# Patient Record
Sex: Female | Born: 1940 | Race: White | Hispanic: No | Marital: Married
Health system: Southern US, Community
[De-identification: ages and names within clinical notes are randomized; demographics above are authoritative.]

## PROBLEM LIST (undated history)

## (undated) DIAGNOSIS — E782 Mixed hyperlipidemia: Secondary | ICD-10-CM

## (undated) DIAGNOSIS — I10 Essential (primary) hypertension: Secondary | ICD-10-CM

## (undated) DIAGNOSIS — I214 Non-ST elevation (NSTEMI) myocardial infarction: Secondary | ICD-10-CM

## (undated) DIAGNOSIS — M1990 Unspecified osteoarthritis, unspecified site: Secondary | ICD-10-CM

## (undated) DIAGNOSIS — F419 Anxiety disorder, unspecified: Secondary | ICD-10-CM

## (undated) DIAGNOSIS — D849 Immunodeficiency, unspecified: Secondary | ICD-10-CM

## (undated) DIAGNOSIS — E7800 Pure hypercholesterolemia, unspecified: Secondary | ICD-10-CM

## (undated) DIAGNOSIS — I4892 Unspecified atrial flutter: Secondary | ICD-10-CM

## (undated) HISTORY — DX: Non-ST elevation (NSTEMI) myocardial infarction: I21.4

## (undated) HISTORY — DX: Mixed hyperlipidemia: E78.2

## (undated) HISTORY — DX: Essential (primary) hypertension: I10

## (undated) HISTORY — DX: Unspecified atrial flutter: I48.92

---

## 2005-11-29 ENCOUNTER — Ambulatory Visit (HOSPITAL_COMMUNITY): Admission: RE | Admit: 2005-11-29 | Discharge: 2005-11-29 | Payer: Self-pay | Admitting: Internal Medicine

## 2005-11-29 IMAGING — CT CT HEAD W/O CM
1 series · 16 of 30 positions shown, 20 images · non-contrast
Comparison: none

HISTORY: Right facial and arm weakness

[Series 3997: — · axial · 0.46mm/px · z∈[-687,-552]mm · 16 of 30 slices shown, 20 images]
[im 2/30  brain]
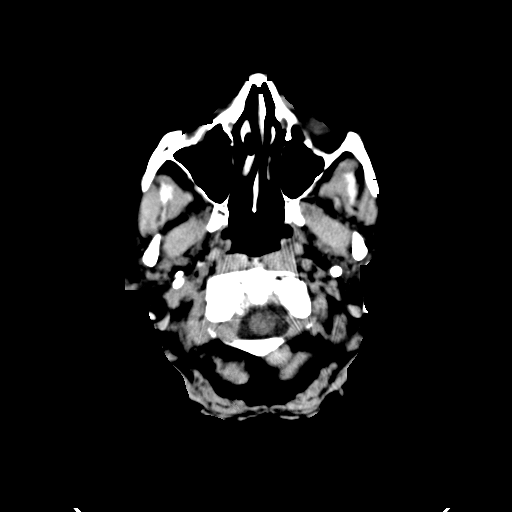
[im 2/30  bone]
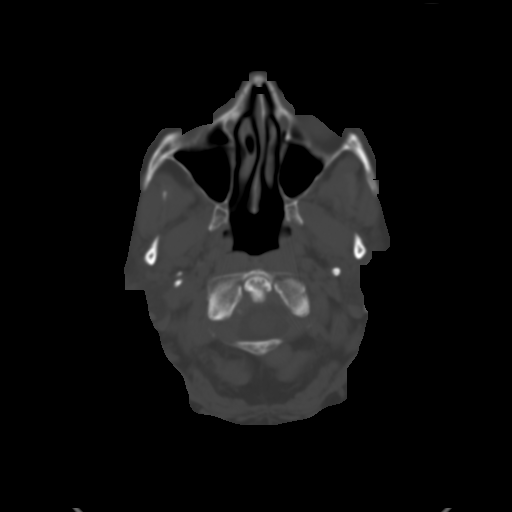
[im 4/30  brain]
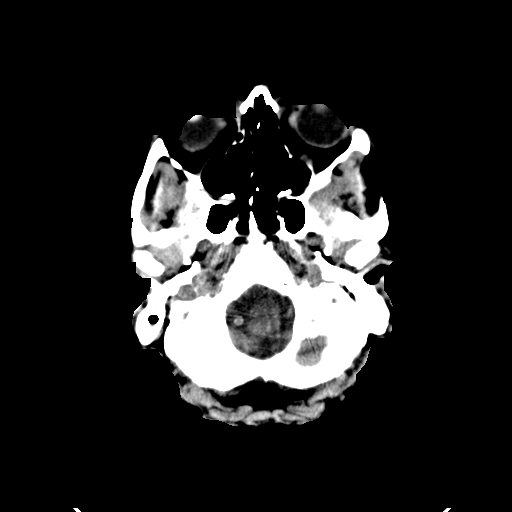
[im 6/30  brain]
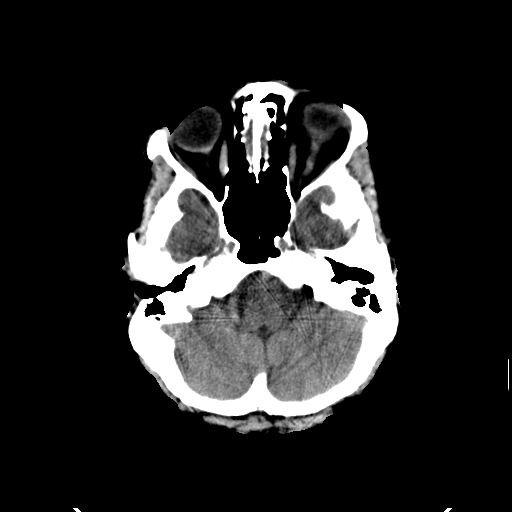
[im 8/30  brain]
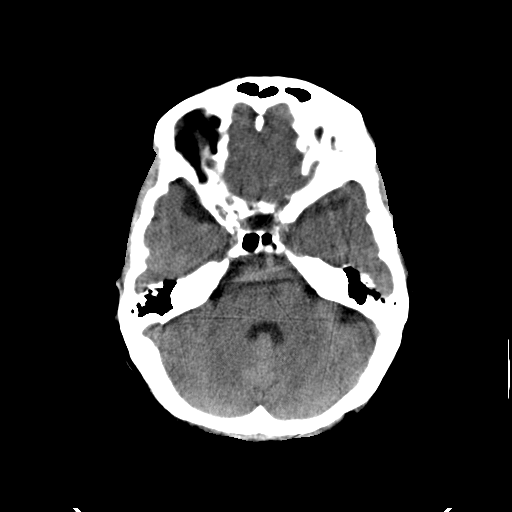
[im 9/30  brain]
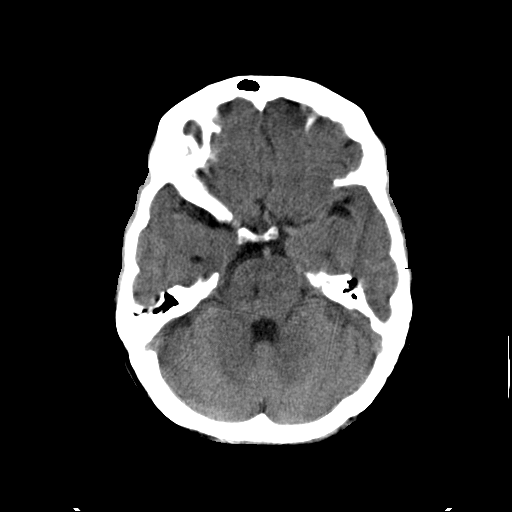
[im 9/30  bone]
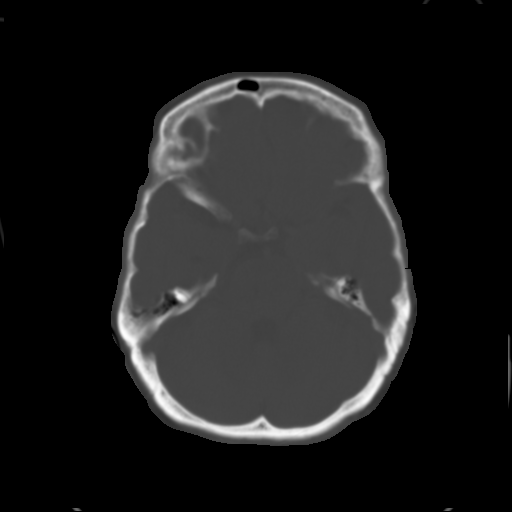
[im 11/30  brain]
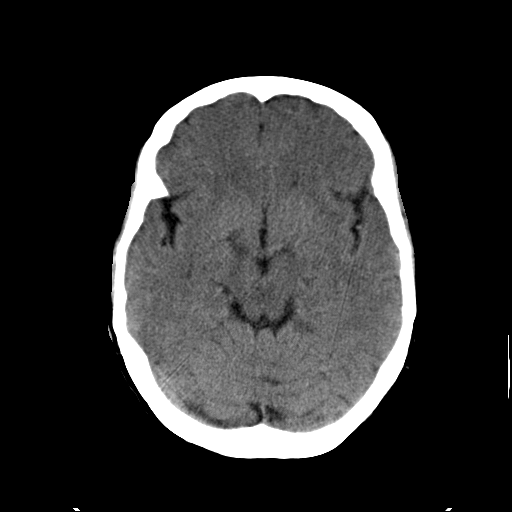
[im 13/30  brain]
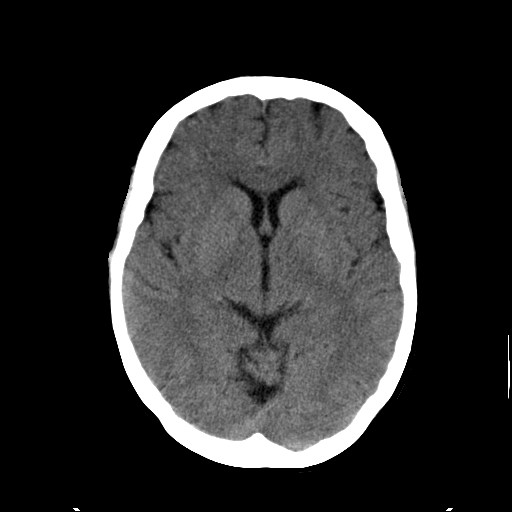
[im 15/30  brain]
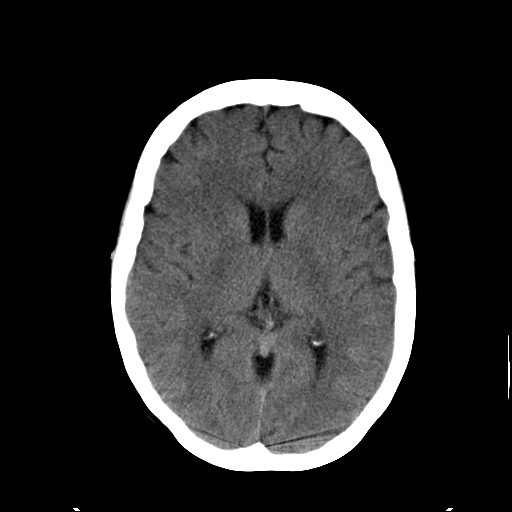
[im 16/30  brain]
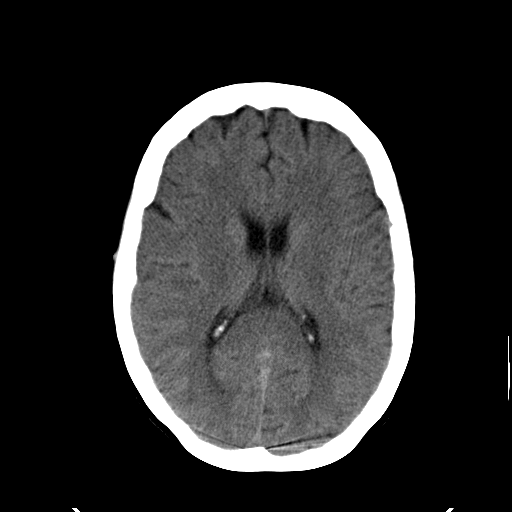
[im 16/30  bone]
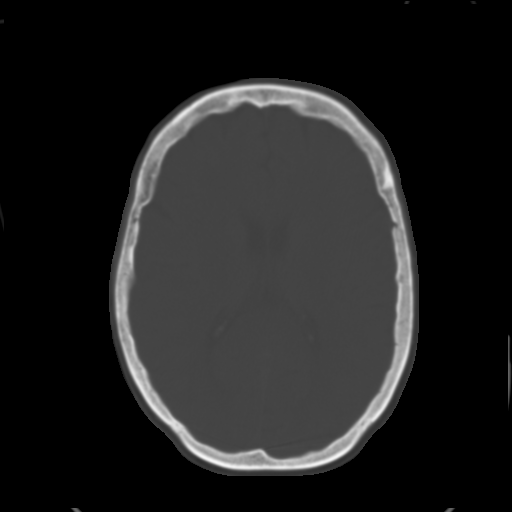
[im 18/30  brain]
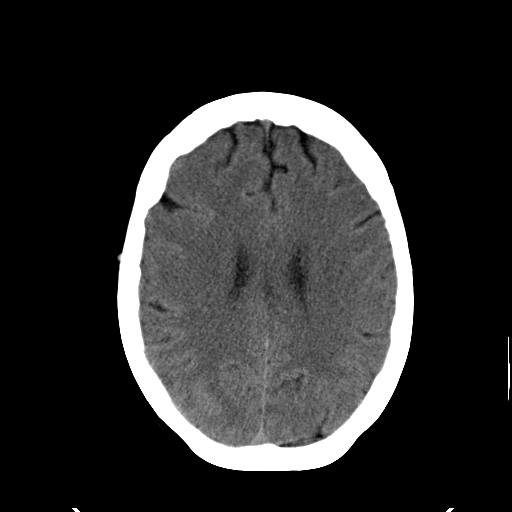
[im 20/30  brain]
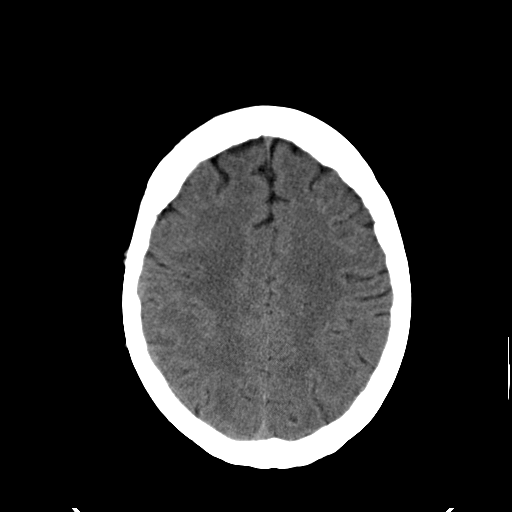
[im 22/30  brain]
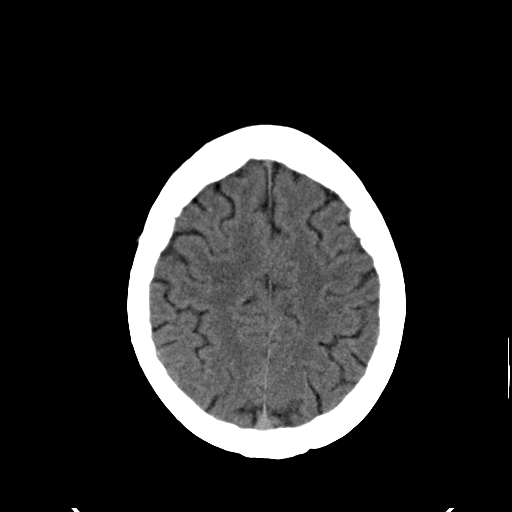
[im 23/30  brain]
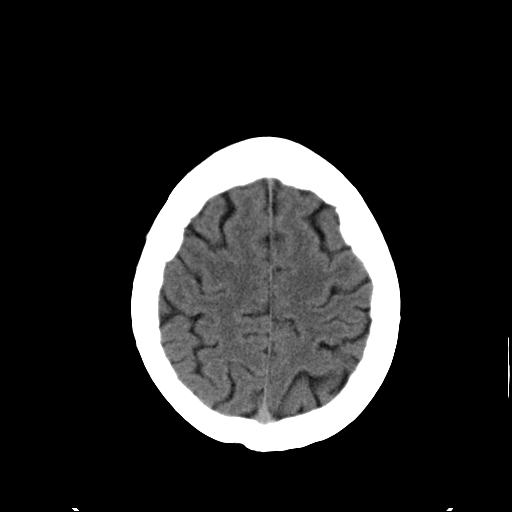
[im 23/30  bone]
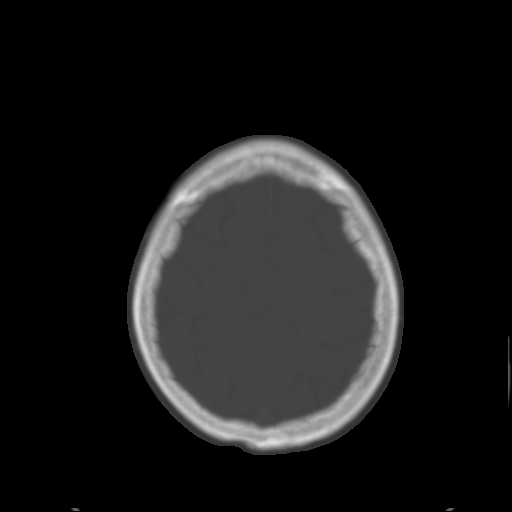
[im 25/30  brain]
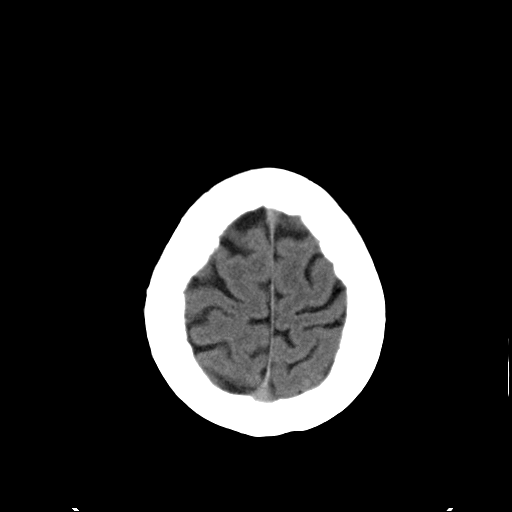
[im 27/30  brain]
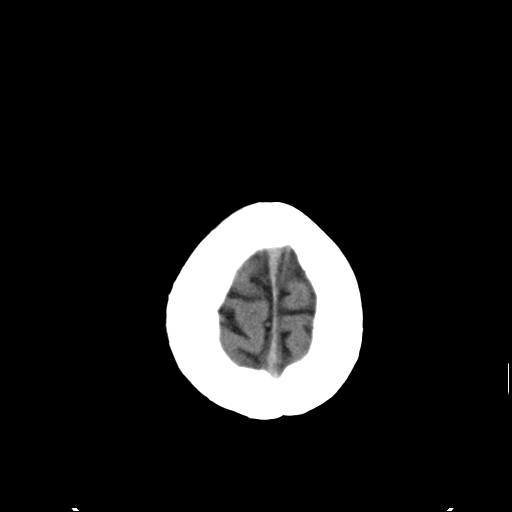
[im 29/30  brain]
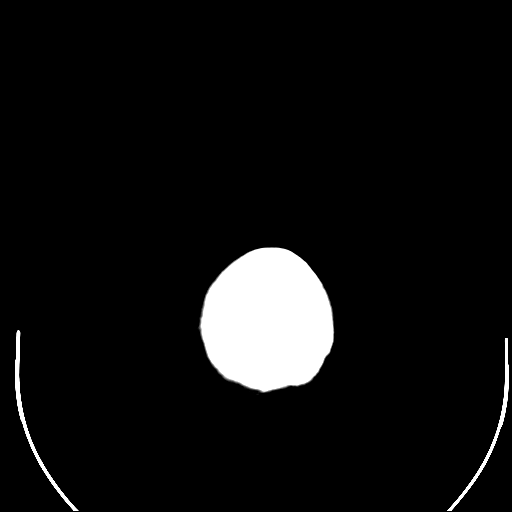

[16 of 30 positions shown; findings below may reference images not displayed]

CT HEAD WITHOUT CONTRAST:

Routine noncontrast CT without priors for comparison.

Normal ventricular morphology.
No midline shift or mass-effect.
Scattered beam hardening artifacts at skullbase and brain stem.
Focal decreased attenuation centrally in right pons, likely subacute to old
pontine infarct.
No acute infarct, intracranial hemorrhage, or mass.
Paranasal sinuses clear.
Bones unremarkable.
IMPRESSION: Right central pontine infarct, probably subacute to old.
No other intracranial abnormalities.

## 2005-11-29 IMAGING — US US CAROTID DUPLEX BILAT
1 series · 14 of 24 positions shown · non-contrast
Comparison: No previous for comparison.

CLINICAL DATA: Right facial and arm weakness.  
 BILATERAL CAROTID DUPLEX DOPPLER ULTRASOUND:

[Series 1: unknown · 0.07mm/px · 14 of 59 slices shown]
[im 1/59]
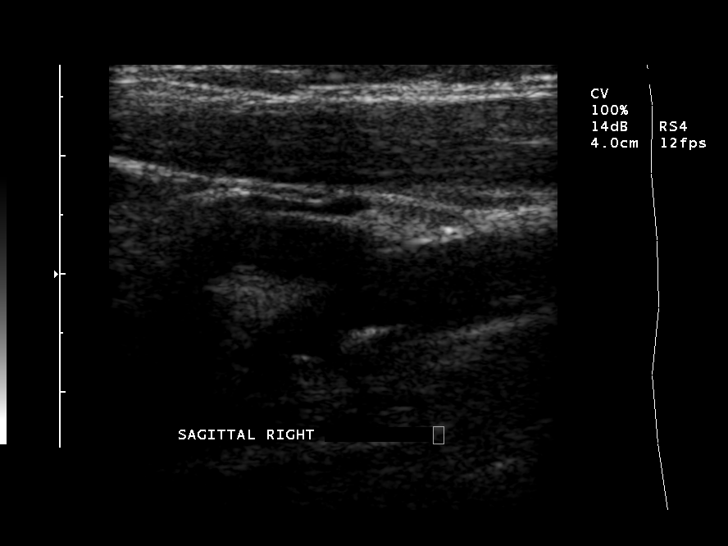
[im 6/59]
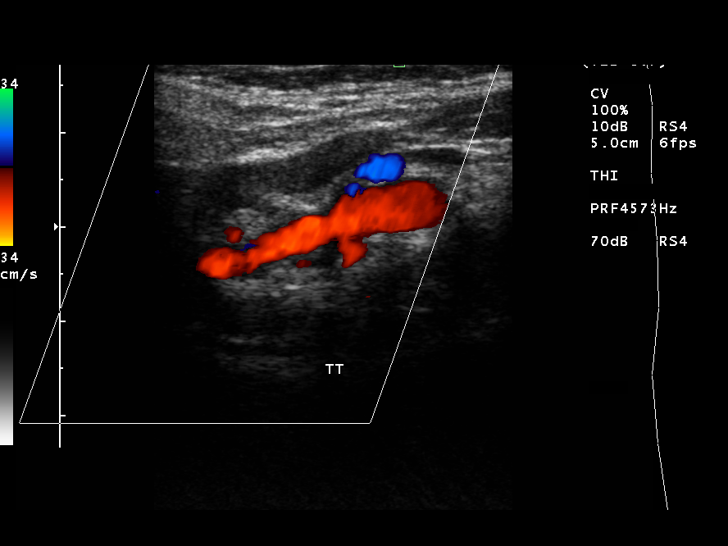
[im 11/59]
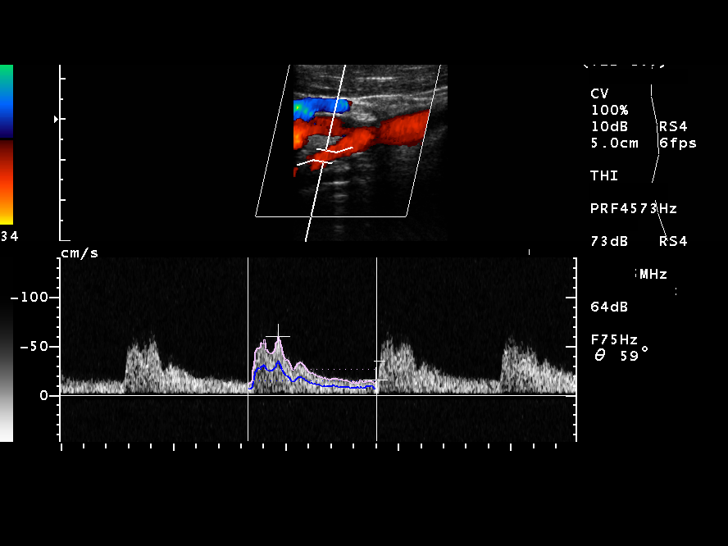
[im 16/59]
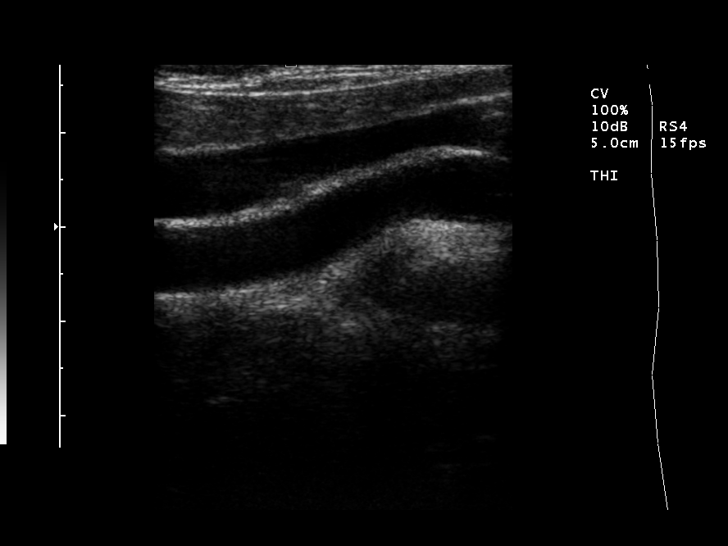
[im 18/59]
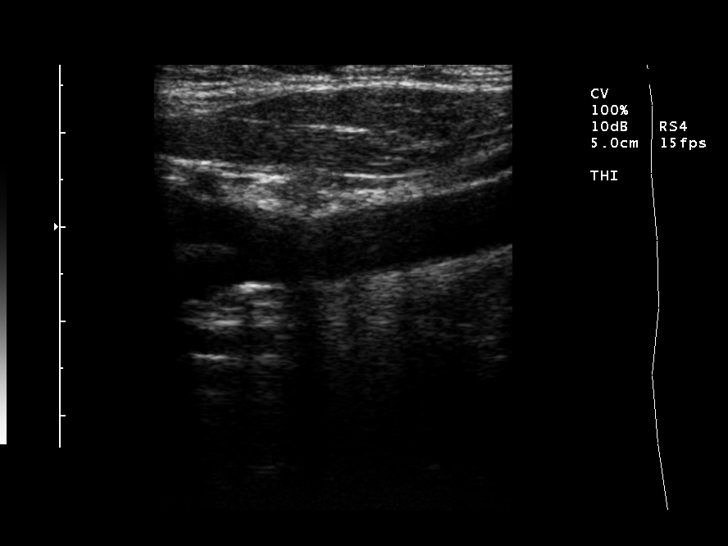
[im 23/59]
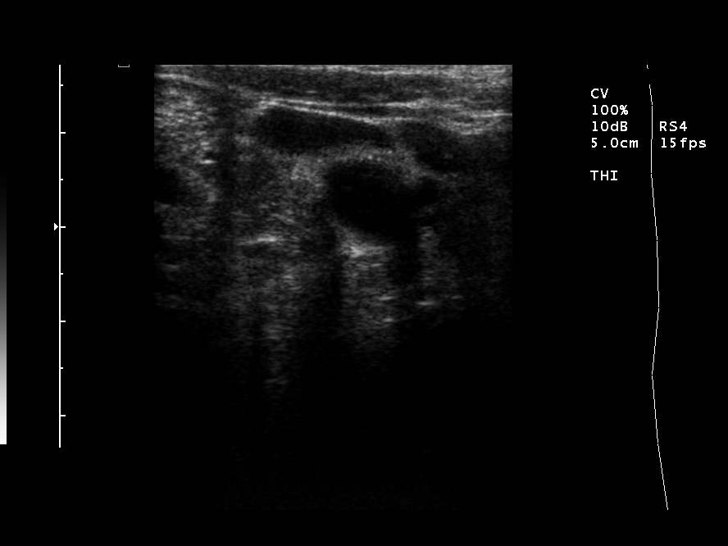
[im 28/59]
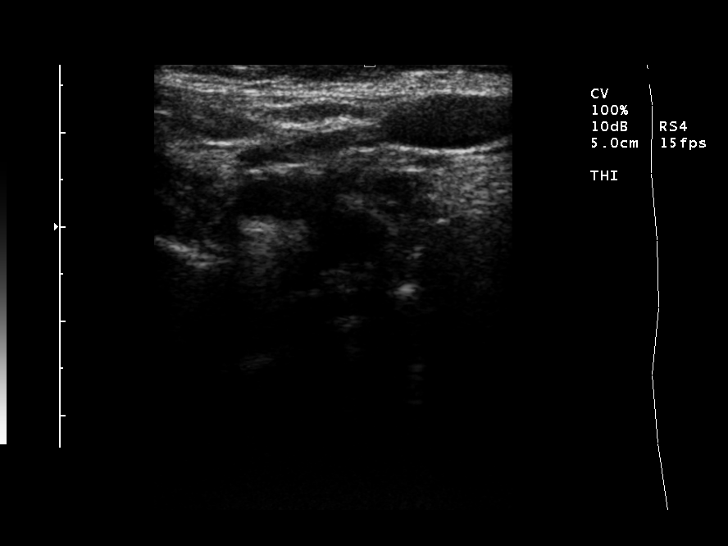
[im 31/59]
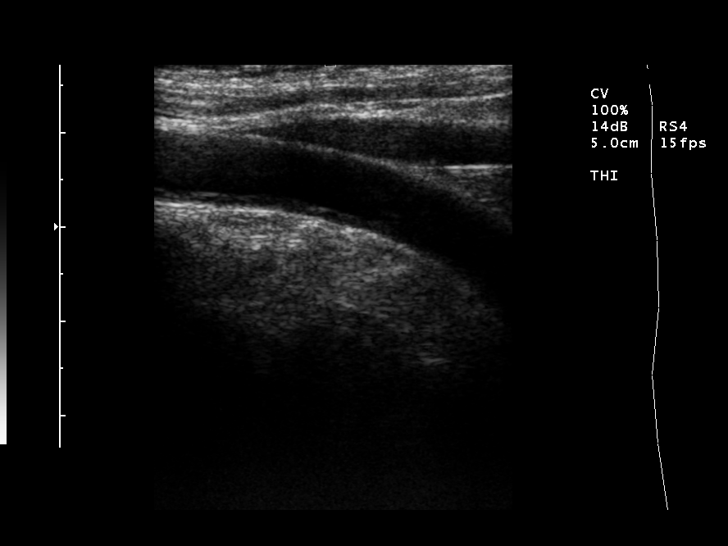
[im 36/59]
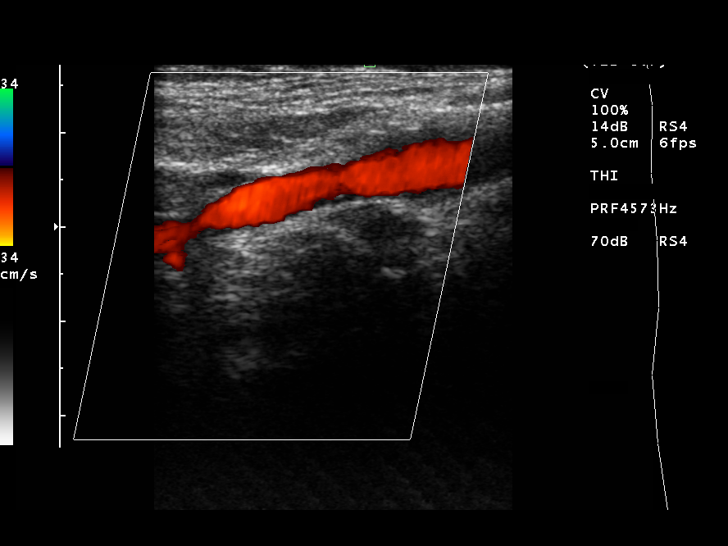
[im 41/59]
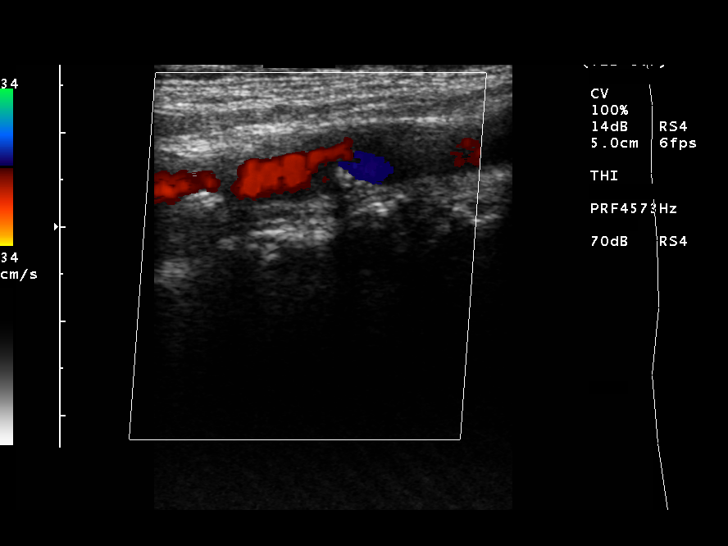
[im 46/59]
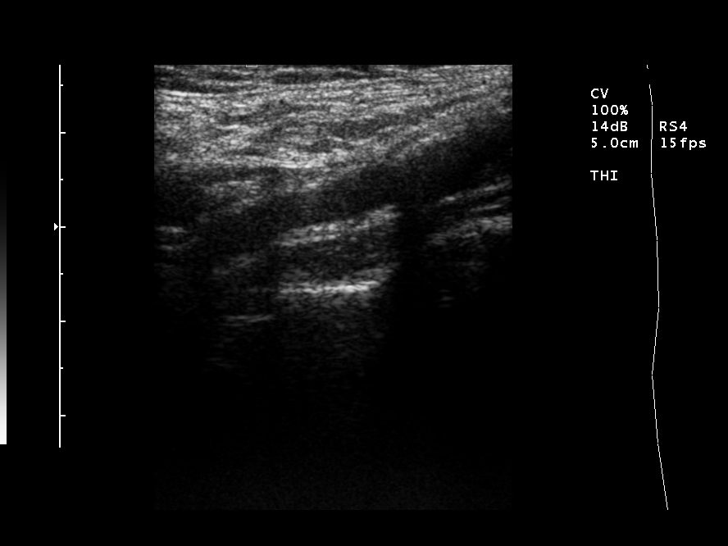
[im 48/59]
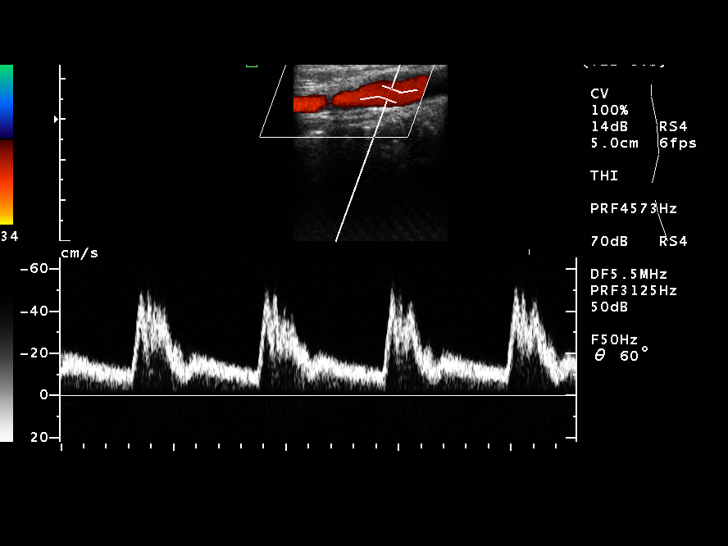
[im 53/59]
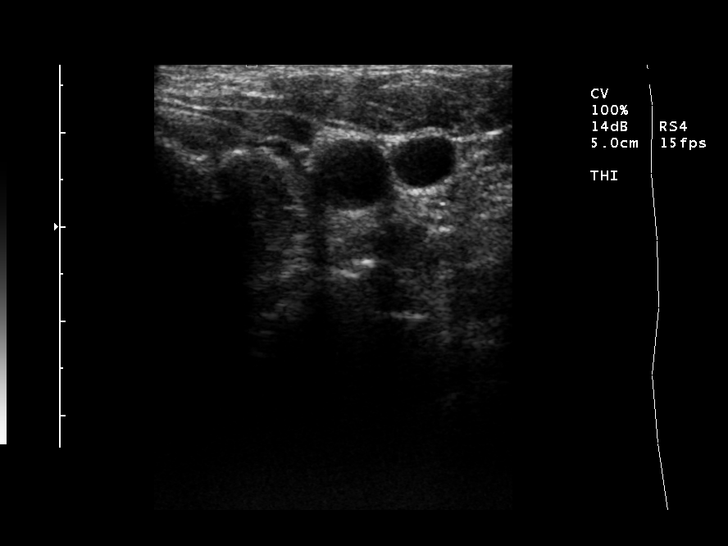
[im 59/59]
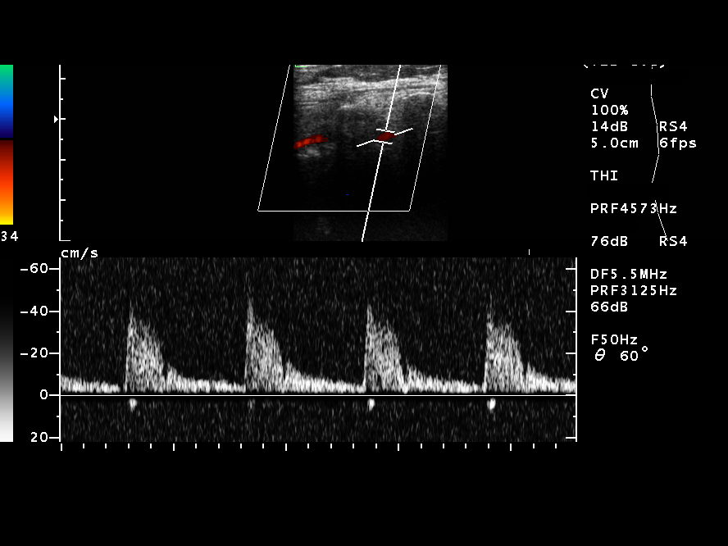

[14 of 24 positions shown; findings below may reference images not displayed]

FINDINGS: The exam is technically adequate.  Minimal plaque in both internal carotid arteries.  Appropriate waveforms throughout the extracranial carotid circulation.  No focal aliasing on color Doppler interrogation.  Antegrade flow in both vertebral arteries.  
 The following Doppler flow velocity measurements were obtained (in cm/sec):
 SITE:  PEAK SYSTOLIC  END DIASTOLIC
 RIGHT  ICA:        60  16  
 RIGHT ECA:    71  3
 RIGHT CCA:    88  13
 RIGHT ICA/CCA RATIO:      .68
 LEFT ICA:  85  25
 LEFT ECA:    59    4
 LEFT CCA:    49  13
 LEFT ICA/CCA RATIO:
 Criteria:  Quantification of carotid stenosis is based on velocity parameters that correlate the residual internal carotid diameter with NASCET-based stenosis levels.
IMPRESSION: Mild proximal internal carotid artery plaque bilaterally without hemodynamically significant stenosis.  Continued surveillance recommended.

## 2006-09-28 ENCOUNTER — Ambulatory Visit: Payer: Self-pay | Admitting: Internal Medicine

## 2006-09-28 ENCOUNTER — Ambulatory Visit (HOSPITAL_COMMUNITY): Admission: RE | Admit: 2006-09-28 | Discharge: 2006-09-28 | Payer: Self-pay | Admitting: Internal Medicine

## 2006-09-28 ENCOUNTER — Encounter: Payer: Self-pay | Admitting: Internal Medicine

## 2010-05-24 NOTE — Op Note (Signed)
Theresa Norman, Theresa Norman               ACCOUNT NO.:  [id number]   MEDICAL RECORD NO.:  [id number]          PATIENT TYPE:  AMB   LOCATION:  DAY                           FACILITY:  [facility]   PHYSICIAN:  R. Roetta Sessions, M.D. DATE OF BIRTH:  [birth year]   DATE OF PROCEDURE:  09/28/2006  DATE OF DISCHARGE:                               OPERATIVE REPORT   PROCEDURE:  Colonoscopy with biopsy.   INDICATIONS FOR PROCEDURE:  A 70 year old, Caucasian female with no  lower GI tract symptoms sent over at the courtesy Dr. Catalina Pizza for  colorectal cancer screening.  Her mother succumbed to colon cancer at  age 50. [name] again has never had her lower GI tract evaluated.  Colonoscopy is now being done.  This approach has been discussed with  the patient at length. The potential risks, benefits and alternatives  have been reviewed, questions answered.  She is agreeable.  Please see  the documentation in the medical record.   MONITORING:  O2 saturation, blood pressure, pulse and respirations were  monitored throughout the entire procedure.   CONSCIOUS SEDATION:  IV Versed, Demerol in incremental doses.   INSTRUMENT:  Pentax video chip system.   FINDINGS:  Digital rectal exam revealed no abnormalities.   ENDOSCOPIC FINDINGS:  The prep was good.   COLON:  The colonic mucosa was surveyed from the rectosigmoid junction  through the left transverse and right colon to the area of the  appendiceal orifice, ileocecal valve and cecum. These structures were  well seen and photographed for the record.   From this level, the scope was slowly withdrawn and all previously  mentioned mucosal surfaces were again seen.  The patient had multiple 2-  3 mm polyps about the cecum which were cold biopsied/removed.  There was  a single diminutive polyp in the mid descending colon which was cold  biopsied/removed. The remainder of the colonic mucosa appeared normal.  The scope was pulled down in the rectum where a  thorough examination of  the rectal mucosa including retroflexed view of anal verge demonstrated  only some anal papilla.  The patient tolerated the procedure well and  was reacted in endoscopy.   IMPRESSION:  1. Anal papilla otherwise normal rectum.  2. Single diminutive polyp mid descending colon cold biopsied/removed.      Multiple diminutive cecal polyps cold biopsied/removed. The      remainder of the colonic mucosa appeared normal.   RECOMMENDATIONS:  1. Follow-up on path.  2. Further recommendations to follow.      Jonathon Bellows, M.D.  Electronically Signed     RMR/MEDQ  D:  09/28/2006  T:  09/28/2006  Job:  [id number]   cc:   Catalina Pizza, M.D.  Fax: [phone]

## 2011-09-18 ENCOUNTER — Encounter: Payer: Self-pay | Admitting: Internal Medicine

## 2013-05-06 ENCOUNTER — Other Ambulatory Visit (HOSPITAL_COMMUNITY): Payer: Self-pay | Admitting: Internal Medicine

## 2013-05-06 ENCOUNTER — Ambulatory Visit (HOSPITAL_COMMUNITY)
Admission: RE | Admit: 2013-05-06 | Discharge: 2013-05-06 | Disposition: A | Discharge status: Home / Self Care | Payer: Medicare Other | Source: Ambulatory Visit | Attending: Internal Medicine | Admitting: Internal Medicine

## 2013-05-06 DIAGNOSIS — M79609 Pain in unspecified limb: Secondary | ICD-10-CM | POA: Insufficient documentation

## 2013-05-06 DIAGNOSIS — M25469 Effusion, unspecified knee: Secondary | ICD-10-CM | POA: Insufficient documentation

## 2013-05-06 DIAGNOSIS — M25562 Pain in left knee: Secondary | ICD-10-CM

## 2013-05-06 DIAGNOSIS — M7989 Other specified soft tissue disorders: Secondary | ICD-10-CM | POA: Insufficient documentation

## 2013-05-06 IMAGING — CR DG KNEE COMPLETE 4+V*L*
4 series · 4 of 4 positions shown · non-contrast
Comparison: None.

CLINICAL DATA: Left knee pain swelling beginning [DATE]. No
known injury.

EXAM:
LEFT KNEE - COMPLETE 4+ VIEW

[view not recorded (1 of 4)]
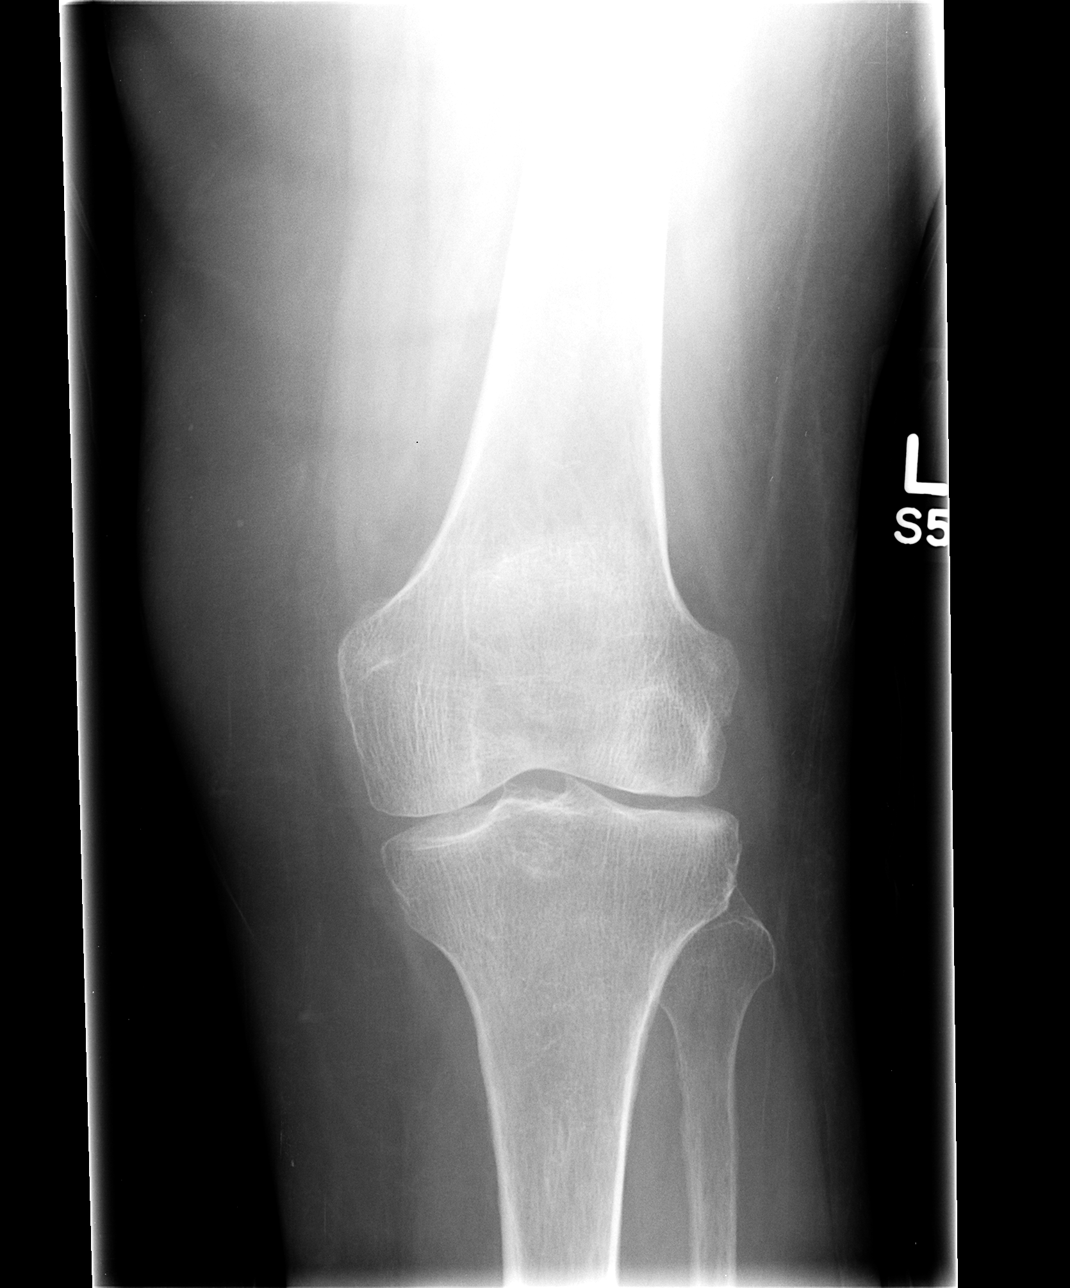

[view not recorded (2 of 4)]
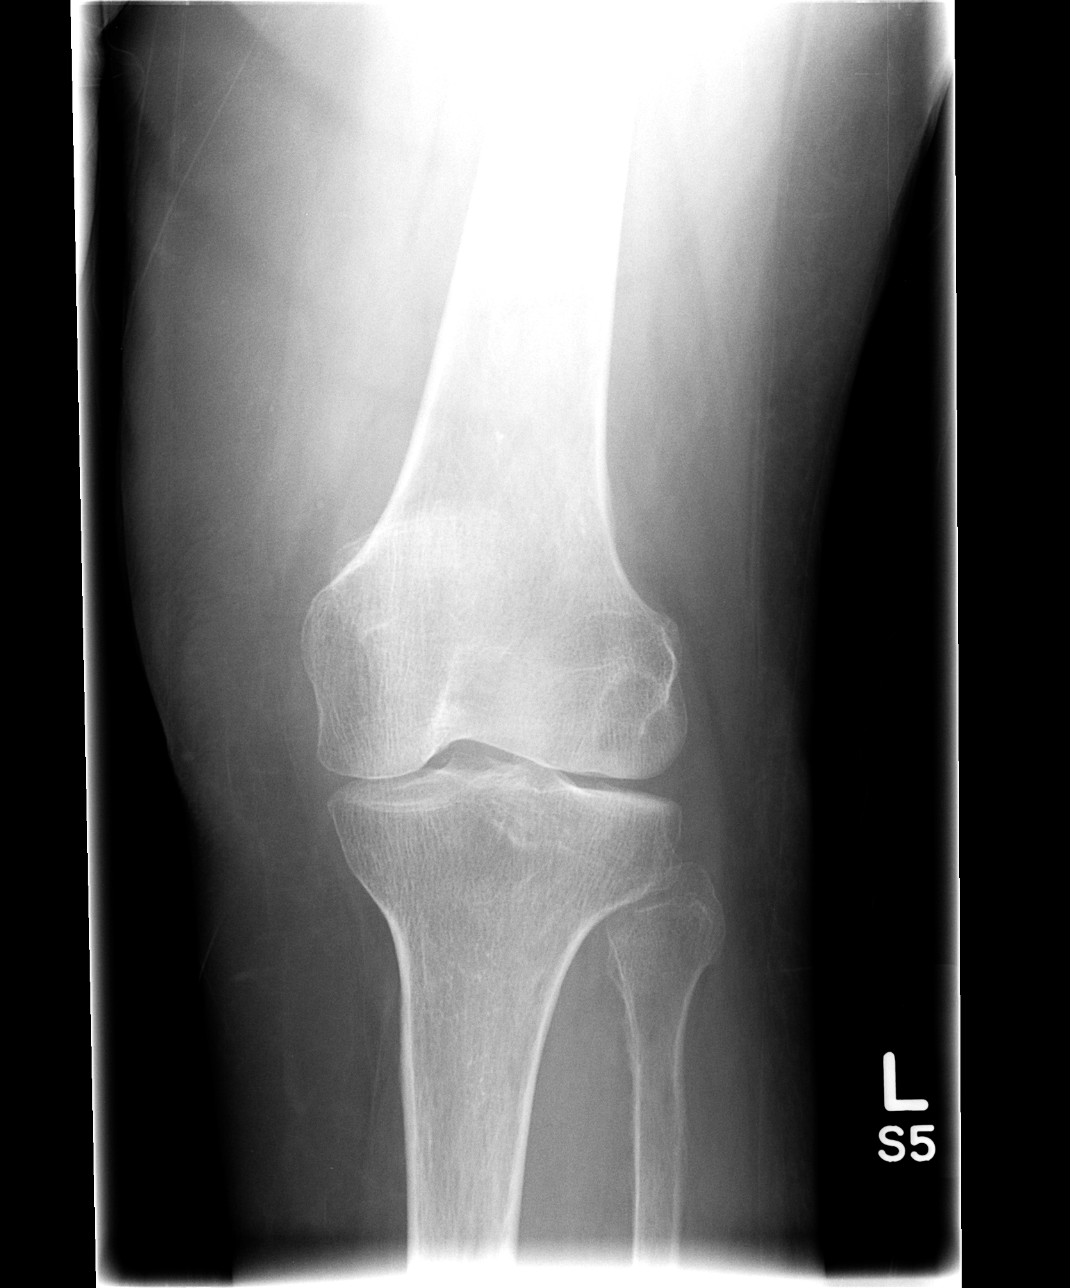

[view not recorded (3 of 4)]
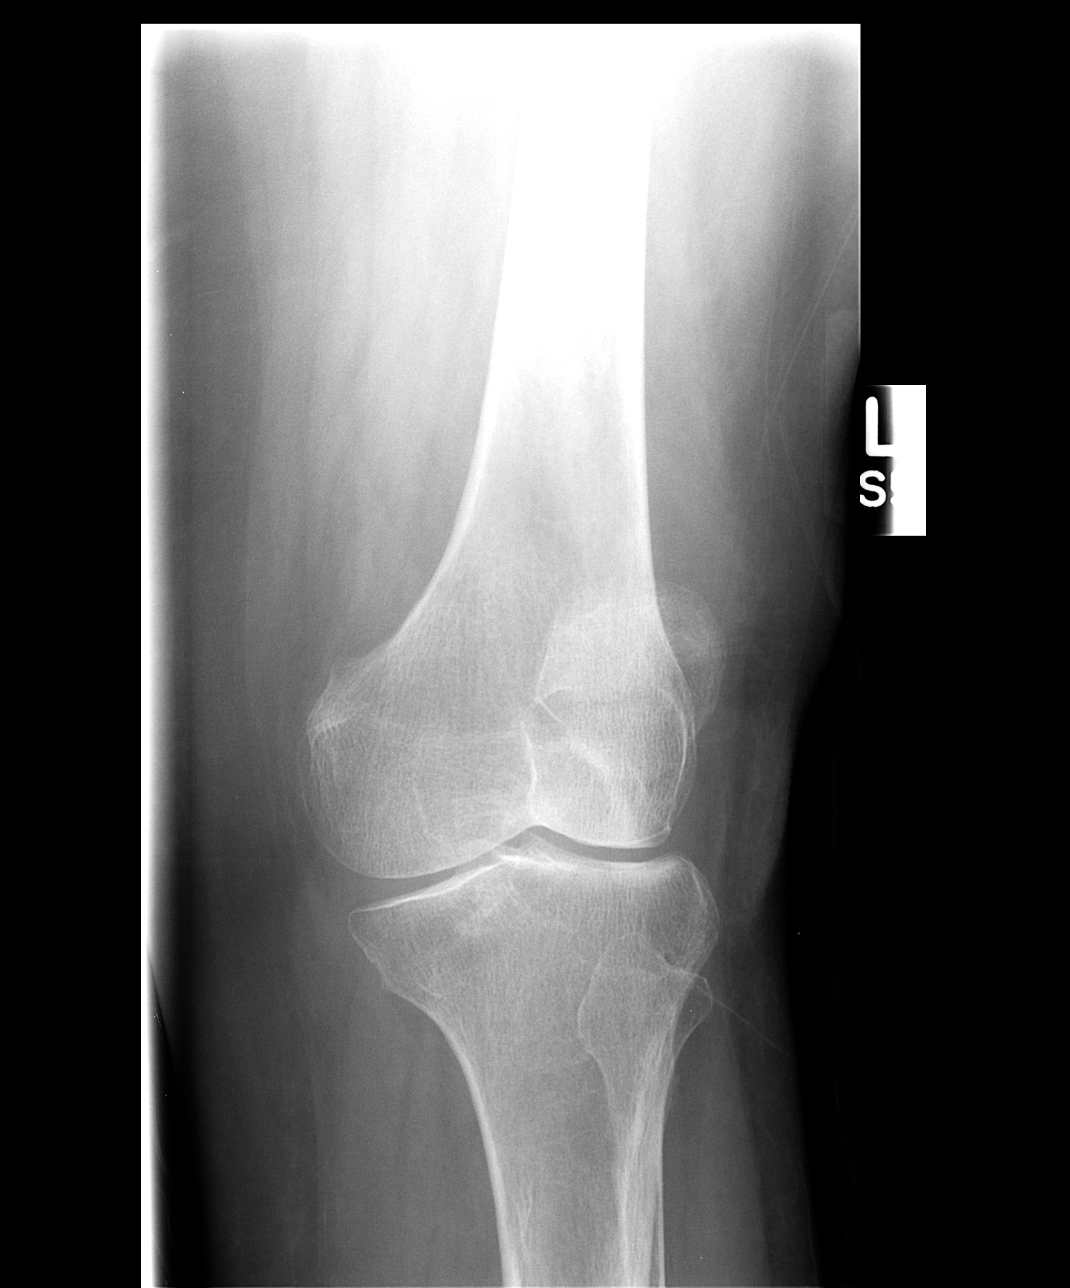

[view not recorded (4 of 4)]
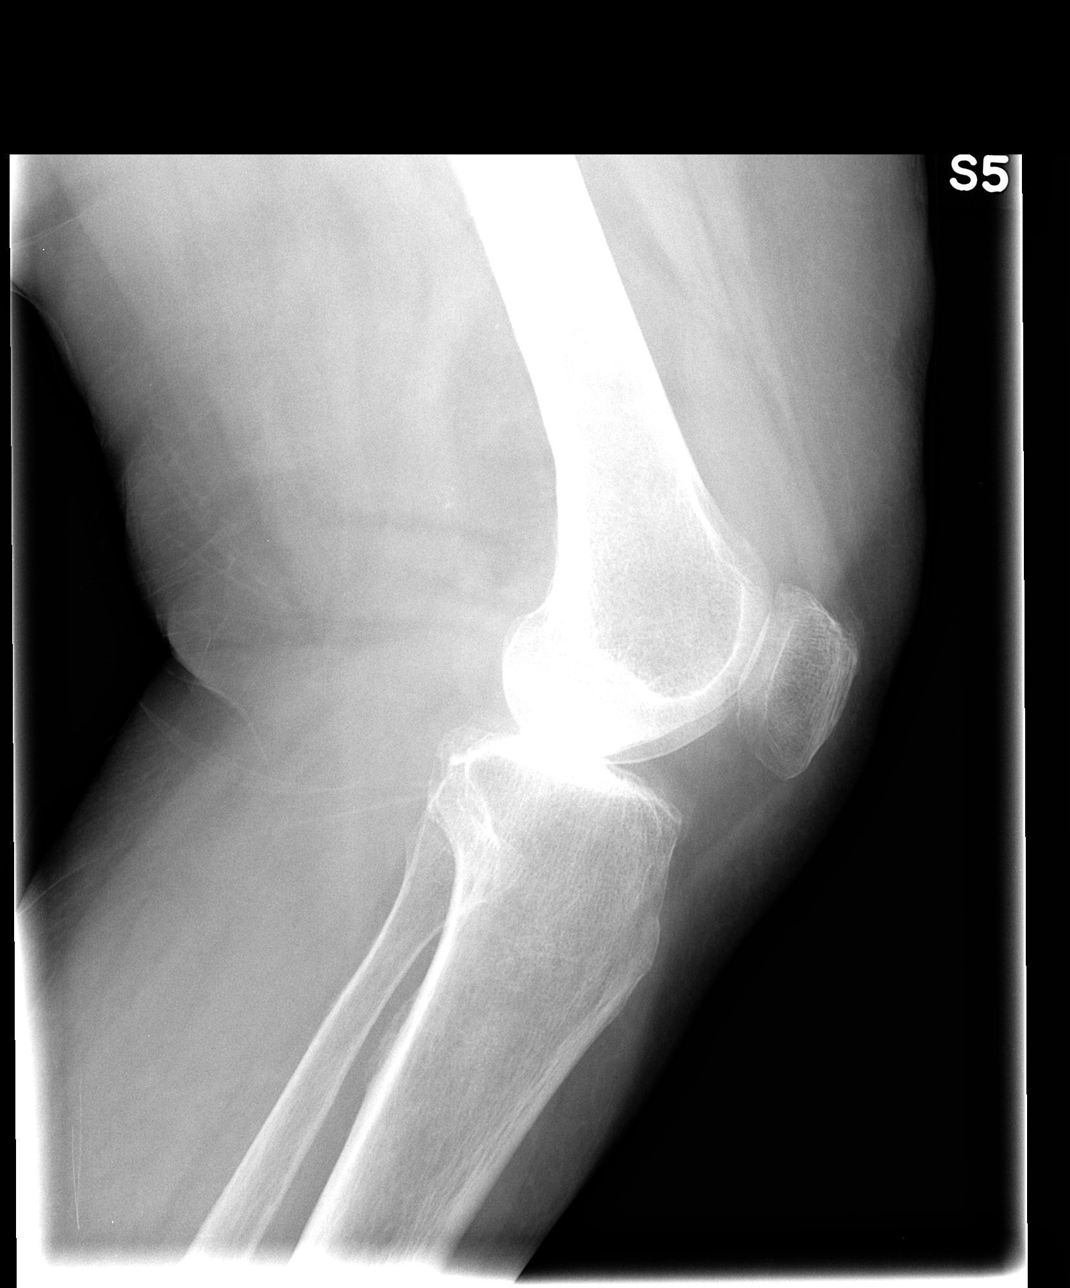

[4 of 4 positions shown; findings below may reference images not displayed]

FINDINGS: There is a small joint effusion. No fracture is identified. No
notable degenerative change is seen about the knee. No erosion or
other focal bony lesion is identified.
IMPRESSION: Small joint effusion.  Otherwise negative.

## 2013-11-17 ENCOUNTER — Other Ambulatory Visit (HOSPITAL_COMMUNITY): Payer: Self-pay | Admitting: Internal Medicine

## 2013-11-17 DIAGNOSIS — Z1231 Encounter for screening mammogram for malignant neoplasm of breast: Secondary | ICD-10-CM

## 2013-12-01 ENCOUNTER — Ambulatory Visit (HOSPITAL_COMMUNITY)
Admission: RE | Admit: 2013-12-01 | Discharge: 2013-12-01 | Disposition: A | Discharge status: Home / Self Care | Payer: Medicare Other | Source: Ambulatory Visit | Attending: Internal Medicine | Admitting: Internal Medicine

## 2013-12-01 DIAGNOSIS — R928 Other abnormal and inconclusive findings on diagnostic imaging of breast: Secondary | ICD-10-CM | POA: Diagnosis not present

## 2013-12-01 DIAGNOSIS — Z1231 Encounter for screening mammogram for malignant neoplasm of breast: Secondary | ICD-10-CM | POA: Insufficient documentation

## 2013-12-03 ENCOUNTER — Other Ambulatory Visit: Payer: Self-pay | Admitting: Internal Medicine

## 2013-12-03 DIAGNOSIS — R928 Other abnormal and inconclusive findings on diagnostic imaging of breast: Secondary | ICD-10-CM

## 2013-12-16 ENCOUNTER — Ambulatory Visit (HOSPITAL_COMMUNITY)
Admission: RE | Admit: 2013-12-16 | Discharge: 2013-12-16 | Disposition: A | Discharge status: Home / Self Care | Payer: Medicare Other | Source: Ambulatory Visit | Attending: Internal Medicine | Admitting: Internal Medicine

## 2013-12-16 ENCOUNTER — Other Ambulatory Visit: Payer: Self-pay | Admitting: Internal Medicine

## 2013-12-16 DIAGNOSIS — R928 Other abnormal and inconclusive findings on diagnostic imaging of breast: Secondary | ICD-10-CM

## 2014-02-18 DIAGNOSIS — E782 Mixed hyperlipidemia: Secondary | ICD-10-CM | POA: Diagnosis not present

## 2014-02-18 DIAGNOSIS — R7301 Impaired fasting glucose: Secondary | ICD-10-CM | POA: Diagnosis not present

## 2014-02-18 DIAGNOSIS — I10 Essential (primary) hypertension: Secondary | ICD-10-CM | POA: Diagnosis not present

## 2014-02-20 DIAGNOSIS — R7301 Impaired fasting glucose: Secondary | ICD-10-CM | POA: Diagnosis not present

## 2014-02-20 DIAGNOSIS — I10 Essential (primary) hypertension: Secondary | ICD-10-CM | POA: Diagnosis not present

## 2014-02-20 DIAGNOSIS — Z6829 Body mass index (BMI) 29.0-29.9, adult: Secondary | ICD-10-CM | POA: Diagnosis not present

## 2014-02-20 DIAGNOSIS — E782 Mixed hyperlipidemia: Secondary | ICD-10-CM | POA: Diagnosis not present

## 2014-07-17 DIAGNOSIS — R7301 Impaired fasting glucose: Secondary | ICD-10-CM | POA: Diagnosis not present

## 2014-07-17 DIAGNOSIS — E782 Mixed hyperlipidemia: Secondary | ICD-10-CM | POA: Diagnosis not present

## 2014-07-17 DIAGNOSIS — I10 Essential (primary) hypertension: Secondary | ICD-10-CM | POA: Diagnosis not present

## 2014-07-21 DIAGNOSIS — E785 Hyperlipidemia, unspecified: Secondary | ICD-10-CM | POA: Diagnosis not present

## 2014-07-21 DIAGNOSIS — I10 Essential (primary) hypertension: Secondary | ICD-10-CM | POA: Diagnosis not present

## 2014-07-21 DIAGNOSIS — R7301 Impaired fasting glucose: Secondary | ICD-10-CM | POA: Diagnosis not present

## 2014-12-15 DIAGNOSIS — R7301 Impaired fasting glucose: Secondary | ICD-10-CM | POA: Diagnosis not present

## 2014-12-15 DIAGNOSIS — E785 Hyperlipidemia, unspecified: Secondary | ICD-10-CM | POA: Diagnosis not present

## 2014-12-17 DIAGNOSIS — E782 Mixed hyperlipidemia: Secondary | ICD-10-CM | POA: Diagnosis not present

## 2014-12-17 DIAGNOSIS — I10 Essential (primary) hypertension: Secondary | ICD-10-CM | POA: Diagnosis not present

## 2014-12-17 DIAGNOSIS — M25569 Pain in unspecified knee: Secondary | ICD-10-CM | POA: Diagnosis not present

## 2014-12-17 DIAGNOSIS — R7301 Impaired fasting glucose: Secondary | ICD-10-CM | POA: Diagnosis not present

## 2015-03-01 ENCOUNTER — Other Ambulatory Visit (HOSPITAL_COMMUNITY): Payer: Self-pay | Admitting: Internal Medicine

## 2015-03-01 DIAGNOSIS — Z1231 Encounter for screening mammogram for malignant neoplasm of breast: Secondary | ICD-10-CM

## 2015-03-04 ENCOUNTER — Ambulatory Visit (HOSPITAL_COMMUNITY)
Admission: RE | Admit: 2015-03-04 | Discharge: 2015-03-04 | Disposition: A | Discharge status: Home / Self Care | Payer: Medicare Other | Source: Ambulatory Visit | Attending: Internal Medicine | Admitting: Internal Medicine

## 2015-03-04 DIAGNOSIS — Z1231 Encounter for screening mammogram for malignant neoplasm of breast: Secondary | ICD-10-CM | POA: Diagnosis not present

## 2015-06-22 DIAGNOSIS — E782 Mixed hyperlipidemia: Secondary | ICD-10-CM | POA: Diagnosis not present

## 2015-06-22 DIAGNOSIS — R7301 Impaired fasting glucose: Secondary | ICD-10-CM | POA: Diagnosis not present

## 2015-06-24 DIAGNOSIS — I10 Essential (primary) hypertension: Secondary | ICD-10-CM | POA: Diagnosis not present

## 2015-06-24 DIAGNOSIS — M25569 Pain in unspecified knee: Secondary | ICD-10-CM | POA: Diagnosis not present

## 2015-06-24 DIAGNOSIS — R7301 Impaired fasting glucose: Secondary | ICD-10-CM | POA: Diagnosis not present

## 2015-06-24 DIAGNOSIS — E782 Mixed hyperlipidemia: Secondary | ICD-10-CM | POA: Diagnosis not present

## 2015-12-22 DIAGNOSIS — E782 Mixed hyperlipidemia: Secondary | ICD-10-CM | POA: Diagnosis not present

## 2015-12-22 DIAGNOSIS — Z0000 Encounter for general adult medical examination without abnormal findings: Secondary | ICD-10-CM | POA: Diagnosis not present

## 2015-12-22 DIAGNOSIS — E559 Vitamin D deficiency, unspecified: Secondary | ICD-10-CM | POA: Diagnosis not present

## 2015-12-22 DIAGNOSIS — R7301 Impaired fasting glucose: Secondary | ICD-10-CM | POA: Diagnosis not present

## 2015-12-24 DIAGNOSIS — E782 Mixed hyperlipidemia: Secondary | ICD-10-CM | POA: Diagnosis not present

## 2015-12-24 DIAGNOSIS — R7301 Impaired fasting glucose: Secondary | ICD-10-CM | POA: Diagnosis not present

## 2015-12-24 DIAGNOSIS — I10 Essential (primary) hypertension: Secondary | ICD-10-CM | POA: Diagnosis not present

## 2016-06-21 DIAGNOSIS — R7301 Impaired fasting glucose: Secondary | ICD-10-CM | POA: Diagnosis not present

## 2016-06-21 DIAGNOSIS — E559 Vitamin D deficiency, unspecified: Secondary | ICD-10-CM | POA: Diagnosis not present

## 2016-06-21 DIAGNOSIS — I10 Essential (primary) hypertension: Secondary | ICD-10-CM | POA: Diagnosis not present

## 2016-06-23 DIAGNOSIS — I10 Essential (primary) hypertension: Secondary | ICD-10-CM | POA: Diagnosis not present

## 2016-06-23 DIAGNOSIS — R944 Abnormal results of kidney function studies: Secondary | ICD-10-CM | POA: Diagnosis not present

## 2016-06-23 DIAGNOSIS — E782 Mixed hyperlipidemia: Secondary | ICD-10-CM | POA: Diagnosis not present

## 2016-06-23 DIAGNOSIS — R7301 Impaired fasting glucose: Secondary | ICD-10-CM | POA: Diagnosis not present

## 2016-06-23 DIAGNOSIS — E559 Vitamin D deficiency, unspecified: Secondary | ICD-10-CM | POA: Diagnosis not present

## 2016-07-13 ENCOUNTER — Other Ambulatory Visit (HOSPITAL_COMMUNITY): Payer: Self-pay | Admitting: Internal Medicine

## 2016-07-13 DIAGNOSIS — Z780 Asymptomatic menopausal state: Secondary | ICD-10-CM

## 2016-07-20 ENCOUNTER — Ambulatory Visit (HOSPITAL_COMMUNITY)
Admission: RE | Admit: 2016-07-20 | Discharge: 2016-07-20 | Disposition: A | Discharge status: Home / Self Care | Payer: Medicare Other | Source: Ambulatory Visit | Attending: Internal Medicine | Admitting: Internal Medicine

## 2016-07-20 DIAGNOSIS — M810 Age-related osteoporosis without current pathological fracture: Secondary | ICD-10-CM | POA: Insufficient documentation

## 2016-07-20 DIAGNOSIS — Z780 Asymptomatic menopausal state: Secondary | ICD-10-CM | POA: Insufficient documentation

## 2016-08-22 DIAGNOSIS — H524 Presbyopia: Secondary | ICD-10-CM | POA: Diagnosis not present

## 2016-09-01 DIAGNOSIS — M810 Age-related osteoporosis without current pathological fracture: Secondary | ICD-10-CM | POA: Diagnosis not present

## 2016-10-18 DIAGNOSIS — H25813 Combined forms of age-related cataract, bilateral: Secondary | ICD-10-CM | POA: Diagnosis not present

## 2016-10-18 DIAGNOSIS — H25811 Combined forms of age-related cataract, right eye: Secondary | ICD-10-CM | POA: Diagnosis not present

## 2016-10-24 NOTE — Patient Instructions (Signed)
Your procedure is scheduled on: 11/07/2016  Report to [facility] at  900  AM.  Call this number if you have problems the morning of surgery: [phone]   Do not eat food or drink liquids :After Midnight.      Take these medicines the morning of surgery with A SIP OF WATER: xanax, losartan, mobic.   Do not wear jewelry, make-up or nail polish.  Do not wear lotions, powders, or perfumes. You may wear deodorant.  Do not shave 48 hours prior to surgery.  Do not bring valuables to the hospital.  Contacts, dentures or bridgework may not be worn into surgery.  Leave suitcase in the car. After surgery it may be brought to your room.  For patients admitted to the hospital, checkout time is 11:00 AM the day of discharge.   Patients discharged the day of surgery will not be allowed to drive home.  :     Please read over the following fact sheets that you were given: Coughing and Deep Breathing, Surgical Site Infection Prevention, Anesthesia Post-op Instructions and Care and Recovery After Surgery    Cataract A cataract is a clouding of the lens of the eye. When a lens becomes cloudy, vision is reduced based on the degree and nature of the clouding. Many cataracts reduce vision to some degree. Some cataracts make people more near-sighted as they develop. Other cataracts increase glare. Cataracts that are ignored and become worse can sometimes look white. The white color can be seen through the pupil. CAUSES   Aging. However, cataracts may occur at any age, even in newborns.   Certain drugs.   Trauma to the eye.   Certain diseases such as diabetes.   Specific eye diseases such as chronic inflammation inside the eye or a sudden attack of a rare form of glaucoma.   Inherited or acquired medical problems.  SYMPTOMS   Gradual, progressive drop in vision in the affected eye.   Severe, rapid visual loss. This most often happens when trauma is the cause.  DIAGNOSIS  To detect a cataract, an  eye doctor examines the lens. Cataracts are best diagnosed with an exam of the eyes with the pupils enlarged (dilated) by drops.  TREATMENT  For an early cataract, vision may improve by using different eyeglasses or stronger lighting. If that does not help your vision, surgery is the only effective treatment. A cataract needs to be surgically removed when vision loss interferes with your everyday activities, such as driving, reading, or watching TV. A cataract may also have to be removed if it prevents examination or treatment of another eye problem. Surgery removes the cloudy lens and usually replaces it with a substitute lens (intraocular lens, IOL).  At a time when both you and your doctor agree, the cataract will be surgically removed. If you have cataracts in both eyes, only one is usually removed at a time. This allows the operated eye to heal and be out of danger from any possible problems after surgery (such as infection or poor wound healing). In rare cases, a cataract may be doing damage to your eye. In these cases, your caregiver may advise surgical removal right away. The vast majority of people who have cataract surgery have better vision afterward. HOME CARE INSTRUCTIONS  If you are not planning surgery, you may be asked to do the following:  Use different eyeglasses.   Use stronger or brighter lighting.   Ask your eye doctor about reducing your medicine  dose or changing medicines if it is thought that a medicine caused your cataract. Changing medicines does not make the cataract go away on its own.   Become familiar with your surroundings. Poor vision can lead to injury. Avoid bumping into things on the affected side. You are at a higher risk for tripping or falling.   Exercise extreme care when driving or operating machinery.   Wear sunglasses if you are sensitive to bright light or experiencing problems with glare.  SEEK IMMEDIATE MEDICAL CARE IF:   You have a worsening or sudden  vision loss.   You notice redness, swelling, or increasing pain in the eye.   You have a fever.  Document Released: 12/26/2004 Document Revised: 12/15/2010 Document Reviewed: 08/19/2010 [facility] Patient Information 2012 [place].PATIENT INSTRUCTIONS POST-ANESTHESIA  IMMEDIATELY FOLLOWING SURGERY:  Do not drive or operate machinery for the first twenty four hours after surgery.  Do not make any important decisions for twenty four hours after surgery or while taking narcotic pain medications or sedatives.  If you develop intractable nausea and vomiting or a severe headache please notify your doctor immediately.  FOLLOW-UP:  Please make an appointment with your surgeon as instructed. You do not need to follow up with anesthesia unless specifically instructed to do so.  WOUND CARE INSTRUCTIONS (if applicable):  Keep a dry clean dressing on the anesthesia/puncture wound site if there is drainage.  Once the wound has quit draining you may leave it open to air.  Generally you should leave the bandage intact for twenty four hours unless there is drainage.  If the epidural site drains for more than 36-48 hours please call the anesthesia department.  QUESTIONS?:  Please feel free to call your physician or the hospital operator if you have any questions, and they will be happy to assist you.

## 2016-10-26 ENCOUNTER — Encounter (HOSPITAL_COMMUNITY): Payer: Self-pay

## 2016-10-26 ENCOUNTER — Other Ambulatory Visit: Payer: Self-pay

## 2016-10-26 ENCOUNTER — Encounter (HOSPITAL_COMMUNITY)
Admission: RE | Admit: 2016-10-26 | Discharge: 2016-10-26 | Disposition: A | Discharge status: Home / Self Care | Payer: Medicare Other | Source: Ambulatory Visit | Attending: Ophthalmology | Admitting: Ophthalmology

## 2016-10-26 DIAGNOSIS — Z01818 Encounter for other preprocedural examination: Secondary | ICD-10-CM | POA: Insufficient documentation

## 2016-10-26 HISTORY — DX: Unspecified osteoarthritis, unspecified site: M19.90

## 2016-10-26 HISTORY — DX: Anxiety disorder, unspecified: F41.9

## 2016-10-26 HISTORY — DX: Essential (primary) hypertension: I10

## 2016-10-26 HISTORY — DX: Pure hypercholesterolemia, unspecified: E78.00

## 2016-10-26 LAB — CBC WITH DIFFERENTIAL/PLATELET
BASOS ABS: 0.1 10*3/uL (ref 0.0–0.1)
Basophils Relative: 1 %
Eosinophils Absolute: 0.3 10*3/uL (ref 0.0–0.7)
Eosinophils Relative: 3 %
HEMATOCRIT: 44.2 % (ref 36.0–46.0)
HEMOGLOBIN: 14.3 g/dL (ref 12.0–15.0)
LYMPHS PCT: 27 %
Lymphs Abs: 3.2 10*3/uL (ref 0.7–4.0)
MCH: 29.3 pg (ref 26.0–34.0)
MCHC: 32.4 g/dL (ref 30.0–36.0)
MCV: 90.6 fL (ref 78.0–100.0)
Monocytes Absolute: 1.1 10*3/uL — ABNORMAL HIGH (ref 0.1–1.0)
Monocytes Relative: 9 %
NEUTROS ABS: 7.5 10*3/uL (ref 1.7–7.7)
NEUTROS PCT: 60 %
Platelets: 278 10*3/uL (ref 150–400)
RBC: 4.88 MIL/uL (ref 3.87–5.11)
RDW: 13.9 % (ref 11.5–15.5)
WBC: 12.2 10*3/uL — AB (ref 4.0–10.5)

## 2016-10-26 LAB — BASIC METABOLIC PANEL
ANION GAP: 13 (ref 5–15)
BUN: 27 mg/dL — ABNORMAL HIGH (ref 6–20)
CO2: 25 mmol/L (ref 22–32)
Calcium: 9.4 mg/dL (ref 8.9–10.3)
Chloride: 101 mmol/L (ref 101–111)
Creatinine, Ser: 1.1 mg/dL — ABNORMAL HIGH (ref 0.44–1.00)
GFR, EST AFRICAN AMERICAN: 55 mL/min — AB (ref >60)
GFR, EST NON AFRICAN AMERICAN: 47 mL/min — AB (ref >60)
GLUCOSE: 115 mg/dL — AB (ref 65–99)
POTASSIUM: 3.3 mmol/L — AB (ref 3.5–5.1)
Sodium: 139 mmol/L (ref 135–145)

## 2016-11-07 ENCOUNTER — Ambulatory Visit (HOSPITAL_COMMUNITY)
Admission: RE | Admit: 2016-11-07 | Discharge: 2016-11-07 | Disposition: A | Discharge status: Home / Self Care | Payer: Medicare Other | Source: Ambulatory Visit | Attending: Ophthalmology | Admitting: Ophthalmology

## 2016-11-07 ENCOUNTER — Encounter (HOSPITAL_COMMUNITY): Payer: Self-pay | Admitting: *Deleted

## 2016-11-07 ENCOUNTER — Encounter (HOSPITAL_COMMUNITY)
Admission: RE | Disposition: A | Discharge status: Home / Self Care | Payer: Self-pay | Source: Ambulatory Visit | Attending: Ophthalmology

## 2016-11-07 ENCOUNTER — Ambulatory Visit (HOSPITAL_COMMUNITY): Payer: Medicare Other | Admitting: Anesthesiology

## 2016-11-07 DIAGNOSIS — I10 Essential (primary) hypertension: Secondary | ICD-10-CM | POA: Diagnosis not present

## 2016-11-07 DIAGNOSIS — M1990 Unspecified osteoarthritis, unspecified site: Secondary | ICD-10-CM | POA: Diagnosis not present

## 2016-11-07 DIAGNOSIS — H25811 Combined forms of age-related cataract, right eye: Secondary | ICD-10-CM | POA: Insufficient documentation

## 2016-11-07 DIAGNOSIS — Z79899 Other long term (current) drug therapy: Secondary | ICD-10-CM | POA: Diagnosis not present

## 2016-11-07 DIAGNOSIS — F419 Anxiety disorder, unspecified: Secondary | ICD-10-CM | POA: Insufficient documentation

## 2016-11-07 DIAGNOSIS — H2511 Age-related nuclear cataract, right eye: Secondary | ICD-10-CM | POA: Diagnosis not present

## 2016-11-07 HISTORY — PX: CATARACT EXTRACTION W/PHACO: SHX586

## 2016-11-07 SURGERY — PHACOEMULSIFICATION, CATARACT, WITH IOL INSERTION
Anesthesia: Monitor Anesthesia Care | Site: Eye | Laterality: Right

## 2016-11-07 MED ORDER — PHENYLEPHRINE HCL 2.5 % OP SOLN
1.0000 [drp] | OPHTHALMIC | Status: AC
  Administered 2016-11-07 (×3): 1 [drp] via OPHTHALMIC

## 2016-11-07 MED ORDER — EPINEPHRINE PF 1 MG/ML IJ SOLN
INTRAOCULAR | Status: DC | PRN
  Administered 2016-11-07: 500 mL

## 2016-11-07 MED ORDER — POVIDONE-IODINE 5 % OP SOLN
OPHTHALMIC | Status: DC | PRN
  Administered 2016-11-07: 1 via OPHTHALMIC

## 2016-11-07 MED ORDER — LACTATED RINGERS IV SOLN
INTRAVENOUS | Status: DC
  Administered 2016-11-07: 10:00:00 via INTRAVENOUS

## 2016-11-07 MED ORDER — TETRACAINE HCL 0.5 % OP SOLN
1.0000 [drp] | OPHTHALMIC | Status: AC
  Administered 2016-11-07 (×3): 1 [drp] via OPHTHALMIC

## 2016-11-07 MED ORDER — CYCLOPENTOLATE-PHENYLEPHRINE 0.2-1 % OP SOLN
1.0000 [drp] | OPHTHALMIC | Status: AC
  Administered 2016-11-07 (×3): 1 [drp] via OPHTHALMIC

## 2016-11-07 MED ORDER — NEOMYCIN-POLYMYXIN-DEXAMETH 3.5-10000-0.1 OP SUSP
OPHTHALMIC | Status: DC | PRN
  Administered 2016-11-07: 2 [drp] via OPHTHALMIC

## 2016-11-07 MED ORDER — BSS IO SOLN
INTRAOCULAR | Status: DC | PRN
  Administered 2016-11-07: 15 mL via INTRAOCULAR

## 2016-11-07 MED ORDER — EPINEPHRINE PF 1 MG/ML IJ SOLN
INTRAMUSCULAR | Status: AC
  Filled 2016-11-07: qty 1

## 2016-11-07 MED ORDER — PROVISC 10 MG/ML IO SOLN
INTRAOCULAR | Status: DC | PRN
  Administered 2016-11-07: 0.85 mL via INTRAOCULAR

## 2016-11-07 MED ORDER — LIDOCAINE HCL 3.5 % OP GEL
1.0000 "application " | Freq: Once | OPHTHALMIC | Status: AC
  Administered 2016-11-07: 1 via OPHTHALMIC

## 2016-11-07 MED ORDER — EPINEPHRINE PF 1 MG/ML IJ SOLN
INTRAOCULAR | Status: DC | PRN
  Administered 2016-11-07: .7 mL via OPHTHALMIC

## 2016-11-07 MED ORDER — FENTANYL CITRATE (PF) 100 MCG/2ML IJ SOLN
25.0000 ug | Freq: Once | INTRAMUSCULAR | Status: AC
  Administered 2016-11-07: 25 ug via INTRAVENOUS

## 2016-11-07 MED ORDER — MIDAZOLAM HCL 2 MG/2ML IJ SOLN
1.0000 mg | INTRAMUSCULAR | Status: AC
  Administered 2016-11-07: 2 mg via INTRAVENOUS
  Filled 2016-11-07: qty 2

## 2016-11-07 MED ORDER — FENTANYL CITRATE (PF) 100 MCG/2ML IJ SOLN
INTRAMUSCULAR | Status: AC
  Filled 2016-11-07: qty 2

## 2016-11-07 SURGICAL SUPPLY — 19 items
CAPSULAR TENSION RING-AMO (OPHTHALMIC RELATED) IMPLANT
CLOTH BEACON ORANGE TIMEOUT ST (SAFETY) ×3 IMPLANT
EYE SHIELD UNIVERSAL CLEAR (GAUZE/BANDAGES/DRESSINGS) ×3 IMPLANT
GLOVE BIOGEL PI IND STRL 6.5 (GLOVE) ×1 IMPLANT
GLOVE BIOGEL PI IND STRL 7.0 (GLOVE) ×1 IMPLANT
GLOVE BIOGEL PI INDICATOR 6.5 (GLOVE) ×2
GLOVE BIOGEL PI INDICATOR 7.0 (GLOVE) ×2
PAD ARMBOARD 7.5X6 YLW CONV (MISCELLANEOUS) ×3 IMPLANT
PROC W NO LENS (INTRAOCULAR LENS)
PROC W SPEC LENS (INTRAOCULAR LENS)
PROCESS W NO LENS (INTRAOCULAR LENS) IMPLANT
PROCESS W SPEC LENS (INTRAOCULAR LENS) IMPLANT
RING MALYGIN (MISCELLANEOUS) IMPLANT
SIGHTPATH CAT PROC W REG LENS (Ophthalmic Related) ×3 IMPLANT
SYRINGE LUER LOK 1CC (MISCELLANEOUS) ×3 IMPLANT
TAPE SURG TRANSPORE 1 IN (GAUZE/BANDAGES/DRESSINGS) ×1 IMPLANT
TAPE SURGICAL TRANSPORE 1 IN (GAUZE/BANDAGES/DRESSINGS) ×2
VISCOELASTIC ADDITIONAL (OPHTHALMIC RELATED) IMPLANT
WATER STERILE IRR 250ML POUR (IV SOLUTION) ×3 IMPLANT

## 2016-11-07 NOTE — Anesthesia Postprocedure Evaluation (Signed)
Anesthesia Post Note  Patient: Silvia Hightower Kimberley  Procedure(s) Performed: CATARACT EXTRACTION PHACO AND INTRAOCULAR LENS PLACEMENT RIGHT EYE (Right Eye)  Patient location during evaluation: Short Stay Anesthesia Type: MAC Level of consciousness: awake and alert Pain management: pain level controlled Vital Signs Assessment: post-procedure vital signs reviewed and stable Respiratory status: spontaneous breathing Cardiovascular status: stable Postop Assessment: no apparent nausea or vomiting Anesthetic complications: no     Last Vitals:  Vitals:   11/07/16 1015 11/07/16 1020  BP: (!) 129/53 (!) 121/59  Resp: 17 15  Temp:    SpO2: 96% 96%    Last Pain:  Vitals:   11/07/16 0944  TempSrc: Oral                 Aava Deland

## 2016-11-07 NOTE — Anesthesia Procedure Notes (Signed)
Procedure Name: MAC Date/Time: 11/07/2016 10:29 AM Performed by: Vista Deck Pre-anesthesia Checklist: Patient identified, Emergency Drugs available, Suction available, Timeout performed and Patient being monitored Patient Re-evaluated:Patient Re-evaluated prior to induction Oxygen Delivery Method: Nasal Cannula

## 2016-11-07 NOTE — Anesthesia Preprocedure Evaluation (Signed)
Anesthesia Evaluation  Patient identified by MRN, date of birth, ID band Patient awake    Reviewed: Allergy & Precautions, NPO status , Patient's Chart, lab work & pertinent test results  Airway Mallampati: I  TM Distance: >3 FB     Dental  (+) Teeth Intact   Pulmonary neg pulmonary ROS,    breath sounds clear to auscultation       Cardiovascular hypertension, Pt. on medications  Rhythm:Regular Rate:Normal     Neuro/Psych PSYCHIATRIC DISORDERS Anxiety negative neurological ROS     GI/Hepatic negative GI ROS, Neg liver ROS,   Endo/Other  negative endocrine ROS  Renal/GU negative Renal ROS     Musculoskeletal  (+) Arthritis ,   Abdominal   Peds  Hematology negative hematology ROS (+)   Anesthesia Other Findings   Reproductive/Obstetrics                             Anesthesia Physical Anesthesia Plan  ASA: II  Anesthesia Plan: MAC   Post-op Pain Management:    Induction: Intravenous  PONV Risk Score and Plan:   Airway Management Planned: Nasal Cannula  Additional Equipment:   Intra-op Plan:   Post-operative Plan:   Informed Consent: I have reviewed the patients History and Physical, chart, labs and discussed the procedure including the risks, benefits and alternatives for the proposed anesthesia with the patient or authorized representative who has indicated his/her understanding and acceptance.     Plan Discussed with:   Anesthesia Plan Comments:         Anesthesia Quick Evaluation

## 2016-11-07 NOTE — Discharge Instructions (Signed)
Moderate Conscious Sedation, Adult, Care After  These instructions provide you with information about caring for yourself after your procedure. Your health care provider may also give you more specific instructions. Your treatment has been planned according to current medical practices, but problems sometimes occur. Call your health care provider if you have any problems or questions after your procedure.  What can I expect after the procedure?  After your procedure, it is common:   To feel sleepy for several hours.   To feel clumsy and have poor balance for several hours.   To have poor judgment for several hours.   To vomit if you eat too soon.    Follow these instructions at home:  For at least 24 hours after the procedure:     Do not:  ? Participate in activities where you could fall or become injured.  ? Drive.  ? Use heavy machinery.  ? Drink alcohol.  ? Take sleeping pills or medicines that cause drowsiness.  ? Make important decisions or sign legal documents.  ? Take care of children on your own.   Rest.  Eating and drinking   Follow the diet recommended by your health care provider.   If you vomit:  ? Drink water, juice, or soup when you can drink without vomiting.  ? Make sure you have little or no nausea before eating solid foods.  General instructions   Have a responsible adult stay with you until you are awake and alert.   Take over-the-counter and prescription medicines only as told by your health care provider.   If you smoke, do not smoke without supervision.   Keep all follow-up visits as told by your health care provider. This is important.  Contact a health care provider if:   You keep feeling nauseous or you keep vomiting.   You feel light-headed.   You develop a rash.   You have a fever.  Get help right away if:   You have trouble breathing.  This information is not intended to replace advice given to you by your health care provider. Make sure you discuss any questions you have  with your health care provider.  Document Released: 10/16/2012 Document Revised: 05/31/2015 Document Reviewed: 04/17/2015  Elsevier Interactive Patient Education  2018 [employer]

## 2016-11-07 NOTE — Transfer of Care (Signed)
Immediate Anesthesia Transfer of Care Note  Patient: Theresa Norman  Procedure(s) Performed: CATARACT EXTRACTION PHACO AND INTRAOCULAR LENS PLACEMENT RIGHT EYE (Right Eye)  Patient Location: Short stay  Anesthesia Type:MAC  Level of Consciousness: awake  Airway & Oxygen Therapy: Patient Spontanous Breathing  Post-op Assessment: Report given to RN and Post -op Vital signs reviewed and stable  Post vital signs: Reviewed and stable  Last Vitals:  Vitals:   11/07/16 1015 11/07/16 1020  BP: (!) 129/53 (!) 121/59  Resp: 17 15  Temp:    SpO2: 96% 96%    Last Pain:  Vitals:   11/07/16 0944  TempSrc: Oral         Complications: No apparent anesthesia complications

## 2016-11-07 NOTE — H&P (Signed)
I have reviewed the H&P, the patient was re-examined, and I have identified no interval changes in medical condition and plan of care since the history and physical of record  

## 2016-11-07 NOTE — Op Note (Signed)
Date of Admission: 11/07/2016  Date of Surgery: 11/07/2016  Pre-Op Dx: Cataract Right  Eye  Post-Op Dx: Senile Combined Cataract  Right  Eye,  Dx Code R48.546  Surgeon: Tonny Branch, M.D.  Assistants: None  Anesthesia: Topical with MAC  Indications: Painless, progressive loss of vision with compromise of daily activities.  Surgery: Cataract Extraction with Intraocular lens Implant Right Eye  Discription: The patient had dilating drops and viscous lidocaine placed into the Right eye in the pre-op holding area. After transfer to the operating room, a time out was performed. The patient was then prepped and draped. Beginning with a 70m blade a paracentesis port was made at the surgeon's 2 o'clock position. The anterior chamber was then filled with 1% non-preserved lidocaine with epinepherine. This was followed by filling the anterior chamber with Provisc.  A 2.46mkeratome blade was used to make a clear corneal incision at the temporal limbus.  A bent cystatome needle was used to create a continuous tear capsulotomy. Hydrodissection was performed with balanced salt solution on a Fine canula. The lens nucleus was then removed using the phacoemulsification handpiece. Residual cortex was removed with the I&A handpiece. The anterior chamber and capsular bag were refilled with Provisc. A posterior chamber intraocular lens was placed into the capsular bag with it's injector. The implant was positioned with the Kuglan hook. The Provisc was then removed from the anterior chamber and capsular bag with the I&A handpiece. Stromal hydration of the main incision and paracentesis port was performed with BSS on a Fine canula. The wounds were tested for leak which was negative. The patient tolerated the procedure well. There were no operative complications. The patient was then transferred to the recovery room in stable condition.  Complications: None  Specimen: None  EBL: None  Prosthetic device: Abbott  Technis, PCB00, power 23.0, SN [id number]

## 2016-11-08 ENCOUNTER — Encounter (HOSPITAL_COMMUNITY): Payer: Self-pay | Admitting: Ophthalmology

## 2016-11-13 DIAGNOSIS — H2512 Age-related nuclear cataract, left eye: Secondary | ICD-10-CM | POA: Diagnosis not present

## 2016-11-14 ENCOUNTER — Encounter (HOSPITAL_COMMUNITY): Payer: Self-pay

## 2016-11-14 ENCOUNTER — Encounter (HOSPITAL_COMMUNITY)
Admission: RE | Admit: 2016-11-14 | Discharge: 2016-11-14 | Disposition: A | Discharge status: Home / Self Care | Payer: Medicare Other | Source: Ambulatory Visit | Attending: Ophthalmology | Admitting: Ophthalmology

## 2016-11-20 ENCOUNTER — Ambulatory Visit (HOSPITAL_COMMUNITY): Payer: Medicare Other | Admitting: Anesthesiology

## 2016-11-20 ENCOUNTER — Encounter (HOSPITAL_COMMUNITY)
Admission: RE | Disposition: A | Discharge status: Home / Self Care | Payer: Self-pay | Source: Ambulatory Visit | Attending: Ophthalmology

## 2016-11-20 ENCOUNTER — Encounter (HOSPITAL_COMMUNITY): Payer: Self-pay | Admitting: *Deleted

## 2016-11-20 ENCOUNTER — Ambulatory Visit (HOSPITAL_COMMUNITY)
Admission: RE | Admit: 2016-11-20 | Discharge: 2016-11-20 | Disposition: A | Discharge status: Home / Self Care | Payer: Medicare Other | Source: Ambulatory Visit | Attending: Ophthalmology | Admitting: Ophthalmology

## 2016-11-20 DIAGNOSIS — I10 Essential (primary) hypertension: Secondary | ICD-10-CM | POA: Insufficient documentation

## 2016-11-20 DIAGNOSIS — Z79899 Other long term (current) drug therapy: Secondary | ICD-10-CM | POA: Insufficient documentation

## 2016-11-20 DIAGNOSIS — F419 Anxiety disorder, unspecified: Secondary | ICD-10-CM | POA: Diagnosis not present

## 2016-11-20 DIAGNOSIS — E7800 Pure hypercholesterolemia, unspecified: Secondary | ICD-10-CM | POA: Insufficient documentation

## 2016-11-20 DIAGNOSIS — H2512 Age-related nuclear cataract, left eye: Secondary | ICD-10-CM | POA: Diagnosis not present

## 2016-11-20 DIAGNOSIS — M1990 Unspecified osteoarthritis, unspecified site: Secondary | ICD-10-CM | POA: Diagnosis not present

## 2016-11-20 HISTORY — PX: CATARACT EXTRACTION W/PHACO: SHX586

## 2016-11-20 SURGERY — PHACOEMULSIFICATION, CATARACT, WITH IOL INSERTION
Anesthesia: Monitor Anesthesia Care | Site: Eye | Laterality: Left

## 2016-11-20 MED ORDER — NEOMYCIN-POLYMYXIN-DEXAMETH 3.5-10000-0.1 OP SUSP
OPHTHALMIC | Status: DC | PRN
  Administered 2016-11-20: 2 [drp] via OPHTHALMIC

## 2016-11-20 MED ORDER — PROVISC 10 MG/ML IO SOLN
INTRAOCULAR | Status: DC | PRN
Start: 2016-11-20 — End: 2016-11-20
  Administered 2016-11-20: 0.85 mL via INTRAOCULAR

## 2016-11-20 MED ORDER — PHENYLEPHRINE HCL 2.5 % OP SOLN
1.0000 [drp] | OPHTHALMIC | Status: AC
  Administered 2016-11-20 (×3): 1 [drp] via OPHTHALMIC

## 2016-11-20 MED ORDER — FENTANYL CITRATE (PF) 100 MCG/2ML IJ SOLN
INTRAMUSCULAR | Status: AC
  Filled 2016-11-20: qty 2

## 2016-11-20 MED ORDER — MIDAZOLAM HCL 2 MG/2ML IJ SOLN
1.0000 mg | INTRAMUSCULAR | Status: AC
  Administered 2016-11-20 (×2): 1 mg via INTRAVENOUS

## 2016-11-20 MED ORDER — LIDOCAINE HCL 3.5 % OP GEL
1.0000 "application " | Freq: Once | OPHTHALMIC | Status: AC
  Administered 2016-11-20: 1 via OPHTHALMIC

## 2016-11-20 MED ORDER — LACTATED RINGERS IV SOLN
INTRAVENOUS | Status: DC
  Administered 2016-11-20: 08:00:00 via INTRAVENOUS

## 2016-11-20 MED ORDER — POVIDONE-IODINE 5 % OP SOLN
OPHTHALMIC | Status: DC | PRN
  Administered 2016-11-20: 1 via OPHTHALMIC

## 2016-11-20 MED ORDER — TETRACAINE HCL 0.5 % OP SOLN
1.0000 [drp] | OPHTHALMIC | Status: AC
  Administered 2016-11-20 (×3): 1 [drp] via OPHTHALMIC

## 2016-11-20 MED ORDER — FENTANYL CITRATE (PF) 100 MCG/2ML IJ SOLN
25.0000 ug | Freq: Once | INTRAMUSCULAR | Status: AC
  Administered 2016-11-20: 25 ug via INTRAVENOUS

## 2016-11-20 MED ORDER — CYCLOPENTOLATE-PHENYLEPHRINE 0.2-1 % OP SOLN
1.0000 [drp] | OPHTHALMIC | Status: AC
  Administered 2016-11-20 (×3): 1 [drp] via OPHTHALMIC

## 2016-11-20 MED ORDER — LIDOCAINE HCL (PF) 1 % IJ SOLN
INTRAOCULAR | Status: DC | PRN
  Administered 2016-11-20: .8 mL via OPHTHALMIC

## 2016-11-20 MED ORDER — BSS IO SOLN
INTRAOCULAR | Status: DC | PRN
  Administered 2016-11-20: 15 mL

## 2016-11-20 MED ORDER — MIDAZOLAM HCL 2 MG/2ML IJ SOLN
INTRAMUSCULAR | Status: AC
  Filled 2016-11-20: qty 2

## 2016-11-20 MED ORDER — EPINEPHRINE PF 1 MG/ML IJ SOLN
INTRAMUSCULAR | Status: DC | PRN
  Administered 2016-11-20: 500 mL

## 2016-11-20 SURGICAL SUPPLY — 10 items

## 2016-11-20 NOTE — Transfer of Care (Signed)
Immediate Anesthesia Transfer of Care Note  Patient: Theresa Norman  Procedure(s) Performed: CATARACT EXTRACTION PHACO AND INTRAOCULAR LENS PLACEMENT (IOC) (Left Eye)  Patient Location: Short Stay  Anesthesia Type:MAC  Level of Consciousness: awake  Airway & Oxygen Therapy: Patient Spontanous Breathing  Post-op Assessment: Report given to RN and Post -op Vital signs reviewed and stable  Post vital signs: Reviewed and stable  Last Vitals:  Vitals:   11/20/16 0648 11/20/16 0700  BP:  117/61  Resp:  14  Temp: 37.1 C     Last Pain:  Vitals:   11/20/16 0648  TempSrc: Oral      Patients Stated Pain Goal: 8 (33/82/50 5397)  Complications: No apparent anesthesia complications

## 2016-11-20 NOTE — Discharge Instructions (Signed)

## 2016-11-20 NOTE — Anesthesia Preprocedure Evaluation (Signed)
Anesthesia Evaluation  Patient identified by MRN, date of birth, ID band Patient awake    Reviewed: Allergy & Precautions, NPO status , Patient's Chart, lab work & pertinent test results  Airway Mallampati: I  TM Distance: >3 FB     Dental  (+) Teeth Intact   Pulmonary neg pulmonary ROS,    breath sounds clear to auscultation       Cardiovascular hypertension, Pt. on medications  Rhythm:Regular Rate:Normal     Neuro/Psych PSYCHIATRIC DISORDERS Anxiety negative neurological ROS     GI/Hepatic negative GI ROS, Neg liver ROS,   Endo/Other  negative endocrine ROS  Renal/GU negative Renal ROS     Musculoskeletal  (+) Arthritis ,   Abdominal   Peds  Hematology negative hematology ROS (+)   Anesthesia Other Findings   Reproductive/Obstetrics                             Anesthesia Physical Anesthesia Plan  ASA: II  Anesthesia Plan: MAC   Post-op Pain Management:    Induction: Intravenous  PONV Risk Score and Plan:   Airway Management Planned: Nasal Cannula  Additional Equipment:   Intra-op Plan:   Post-operative Plan:   Informed Consent: I have reviewed the patients History and Physical, chart, labs and discussed the procedure including the risks, benefits and alternatives for the proposed anesthesia with the patient or authorized representative who has indicated his/her understanding and acceptance.     Plan Discussed with:   Anesthesia Plan Comments:         Anesthesia Quick Evaluation

## 2016-11-20 NOTE — Anesthesia Postprocedure Evaluation (Signed)
Anesthesia Post Note  Patient: Theresa Norman  Procedure(s) Performed: CATARACT EXTRACTION PHACO AND INTRAOCULAR LENS PLACEMENT (IOC) (Left Eye)  Patient location during evaluation: PACU Anesthesia Type: MAC Level of consciousness: awake and alert and patient cooperative Pain management: pain level controlled Vital Signs Assessment: post-procedure vital signs reviewed and stable Respiratory status: spontaneous breathing, nonlabored ventilation and respiratory function stable Cardiovascular status: blood pressure returned to baseline Postop Assessment: no apparent nausea or vomiting Anesthetic complications: no     Last Vitals:  Vitals:   11/20/16 0648 11/20/16 0700  BP:  117/61  Resp:  14  Temp: 37.1 C     Last Pain:  Vitals:   11/20/16 0648  TempSrc: Oral                 Finnbar Cedillos J

## 2016-11-20 NOTE — H&P (Signed)
I have reviewed the H&P, the patient was re-examined, and I have identified no interval changes in medical condition and plan of care since the history and physical of record  

## 2016-11-20 NOTE — Op Note (Signed)
Date of Admission: 11/20/2016  Date of Surgery: 11/20/2016  Pre-Op Dx: Cataract Left  Eye  Post-Op Dx: Senile Nuclear Cataract  Left  Eye,  Dx Code H25.12  Surgeon: Tonny Branch, M.D.  Assistants: None  Anesthesia: Topical with MAC  Indications: Painless, progressive loss of vision with compromise of daily activities.  Surgery: Cataract Extraction with Intraocular lens Implant Left Eye  Discription: The patient had dilating drops and viscous lidocaine placed into the Left eye in the pre-op holding area. After transfer to the operating room, a time out was performed. The patient was then prepped and draped. Beginning with a 37m blade a paracentesis port was made at the surgeon's 2 o'clock position. The anterior chamber was then filled with 1% non-preserved lidocaine with epinepherine. This was followed by filling the anterior chamber with Provisc.  A 2.443mkeratome blade was used to make a clear corneal incision at the temporal limbus.  A bent cystatome needle was used to create a continuous tear capsulotomy. Hydrodissection was performed with balanced salt solution on a Fine canula. The lens nucleus was then removed using the phacoemulsification handpiece. Residual cortex was removed with the I&A handpiece. The anterior chamber and capsular bag were refilled with Provisc. A posterior chamber intraocular lens was placed into the capsular bag with it's injector. The implant was positioned with the Kuglan hook. The Provisc was then removed from the anterior chamber and capsular bag with the I&A handpiece. Stromal hydration of the main incision and paracentesis port was performed with BSS on a Fine canula. The wounds were tested for leak which was negative. The patient tolerated the procedure well. There were no operative complications. The patient was then transferred to the recovery room in stable condition.  Complications: None  Specimen: None  EBL: None  Prosthetic device: Abbott Technis,  PCB00, power 23.5, SN [id number]

## 2016-11-21 ENCOUNTER — Encounter (HOSPITAL_COMMUNITY): Payer: Self-pay | Admitting: Ophthalmology

## 2016-12-12 DIAGNOSIS — R7301 Impaired fasting glucose: Secondary | ICD-10-CM | POA: Diagnosis not present

## 2016-12-12 DIAGNOSIS — E559 Vitamin D deficiency, unspecified: Secondary | ICD-10-CM | POA: Diagnosis not present

## 2016-12-12 DIAGNOSIS — I10 Essential (primary) hypertension: Secondary | ICD-10-CM | POA: Diagnosis not present

## 2017-01-05 DIAGNOSIS — R944 Abnormal results of kidney function studies: Secondary | ICD-10-CM | POA: Diagnosis not present

## 2017-01-05 DIAGNOSIS — I10 Essential (primary) hypertension: Secondary | ICD-10-CM | POA: Diagnosis not present

## 2017-04-19 DIAGNOSIS — G4762 Sleep related leg cramps: Secondary | ICD-10-CM | POA: Diagnosis not present

## 2017-04-19 DIAGNOSIS — R944 Abnormal results of kidney function studies: Secondary | ICD-10-CM | POA: Diagnosis not present

## 2017-04-19 DIAGNOSIS — I10 Essential (primary) hypertension: Secondary | ICD-10-CM | POA: Diagnosis not present

## 2017-04-19 DIAGNOSIS — M1712 Unilateral primary osteoarthritis, left knee: Secondary | ICD-10-CM | POA: Diagnosis not present

## 2017-04-19 DIAGNOSIS — R7301 Impaired fasting glucose: Secondary | ICD-10-CM | POA: Diagnosis not present

## 2017-04-19 DIAGNOSIS — R600 Localized edema: Secondary | ICD-10-CM | POA: Diagnosis not present

## 2017-04-19 DIAGNOSIS — E559 Vitamin D deficiency, unspecified: Secondary | ICD-10-CM | POA: Diagnosis not present

## 2017-04-19 DIAGNOSIS — M25562 Pain in left knee: Secondary | ICD-10-CM | POA: Diagnosis not present

## 2017-04-24 DIAGNOSIS — I10 Essential (primary) hypertension: Secondary | ICD-10-CM | POA: Diagnosis not present

## 2017-04-24 DIAGNOSIS — E782 Mixed hyperlipidemia: Secondary | ICD-10-CM | POA: Diagnosis not present

## 2017-04-24 DIAGNOSIS — R7301 Impaired fasting glucose: Secondary | ICD-10-CM | POA: Diagnosis not present

## 2017-04-24 DIAGNOSIS — R944 Abnormal results of kidney function studies: Secondary | ICD-10-CM | POA: Diagnosis not present

## 2017-04-24 DIAGNOSIS — E559 Vitamin D deficiency, unspecified: Secondary | ICD-10-CM | POA: Diagnosis not present

## 2017-06-22 ENCOUNTER — Other Ambulatory Visit: Payer: Self-pay

## 2017-06-22 NOTE — Patient Outreach (Signed)
[facility]  06/22/2017  Theresa [name] February 21, 1940 [id number]   Medication Adherence call to [name] spoke with patient she said she still taking Telmisartan/Hctz 80/25 mg patient still has medication she was taking Losartan before but doctor change it to Telmisartan/Hctz. Theresa Norman is showing past due [facility]  Lillia AbedAna Ollison-Moran CPhT Pharmacy Technician [facility] Care Management Direct Dial [phone]  Fax [phone] [email or web address]

## 2017-10-24 DIAGNOSIS — R600 Localized edema: Secondary | ICD-10-CM | POA: Diagnosis not present

## 2017-10-24 DIAGNOSIS — I10 Essential (primary) hypertension: Secondary | ICD-10-CM | POA: Diagnosis not present

## 2017-10-24 DIAGNOSIS — M25562 Pain in left knee: Secondary | ICD-10-CM | POA: Diagnosis not present

## 2017-10-24 DIAGNOSIS — R7301 Impaired fasting glucose: Secondary | ICD-10-CM | POA: Diagnosis not present

## 2017-10-24 DIAGNOSIS — M1712 Unilateral primary osteoarthritis, left knee: Secondary | ICD-10-CM | POA: Diagnosis not present

## 2017-10-24 DIAGNOSIS — E559 Vitamin D deficiency, unspecified: Secondary | ICD-10-CM | POA: Diagnosis not present

## 2017-10-24 DIAGNOSIS — G4762 Sleep related leg cramps: Secondary | ICD-10-CM | POA: Diagnosis not present

## 2017-10-24 DIAGNOSIS — R944 Abnormal results of kidney function studies: Secondary | ICD-10-CM | POA: Diagnosis not present

## 2017-10-26 DIAGNOSIS — E782 Mixed hyperlipidemia: Secondary | ICD-10-CM | POA: Diagnosis not present

## 2017-10-26 DIAGNOSIS — R7301 Impaired fasting glucose: Secondary | ICD-10-CM | POA: Diagnosis not present

## 2017-10-26 DIAGNOSIS — Z0000 Encounter for general adult medical examination without abnormal findings: Secondary | ICD-10-CM | POA: Diagnosis not present

## 2017-10-26 DIAGNOSIS — I10 Essential (primary) hypertension: Secondary | ICD-10-CM | POA: Diagnosis not present

## 2017-10-26 DIAGNOSIS — N182 Chronic kidney disease, stage 2 (mild): Secondary | ICD-10-CM | POA: Diagnosis not present

## 2017-12-13 ENCOUNTER — Other Ambulatory Visit: Payer: Self-pay

## 2017-12-13 NOTE — Patient Outreach (Signed)
[facility]  12/13/2017  Rubie MaidMolly C Dills October 29, 1940 [id number]   Medication Adherence call to Mrs. Kirt BoysMolly Sandiford spoke with patient she is due on Telmisaratn / HCTZ 80/25 mg patient has medication At this time for another week and then she will order from [facility]. Mrs. Labus is showing past due under [employer].   Lillia AbedAna Ollison-Moran CPhT Pharmacy Technician [facility] Care Management Direct Dial [phone]  Fax [phone] [email or web address]

## 2018-10-31 DIAGNOSIS — E559 Vitamin D deficiency, unspecified: Secondary | ICD-10-CM | POA: Diagnosis not present

## 2018-10-31 DIAGNOSIS — E782 Mixed hyperlipidemia: Secondary | ICD-10-CM | POA: Diagnosis not present

## 2018-10-31 DIAGNOSIS — R7301 Impaired fasting glucose: Secondary | ICD-10-CM | POA: Diagnosis not present

## 2018-10-31 DIAGNOSIS — I10 Essential (primary) hypertension: Secondary | ICD-10-CM | POA: Diagnosis not present

## 2018-10-31 DIAGNOSIS — R946 Abnormal results of thyroid function studies: Secondary | ICD-10-CM | POA: Diagnosis not present

## 2018-11-05 DIAGNOSIS — N182 Chronic kidney disease, stage 2 (mild): Secondary | ICD-10-CM | POA: Diagnosis not present

## 2018-11-05 DIAGNOSIS — I10 Essential (primary) hypertension: Secondary | ICD-10-CM | POA: Diagnosis not present

## 2018-11-05 DIAGNOSIS — R7301 Impaired fasting glucose: Secondary | ICD-10-CM | POA: Diagnosis not present

## 2018-11-05 DIAGNOSIS — Z0000 Encounter for general adult medical examination without abnormal findings: Secondary | ICD-10-CM | POA: Diagnosis not present

## 2018-11-05 DIAGNOSIS — E782 Mixed hyperlipidemia: Secondary | ICD-10-CM | POA: Diagnosis not present

## 2018-11-05 DIAGNOSIS — Z2821 Immunization not carried out because of patient refusal: Secondary | ICD-10-CM | POA: Diagnosis not present

## 2018-12-19 DIAGNOSIS — I129 Hypertensive chronic kidney disease with stage 1 through stage 4 chronic kidney disease, or unspecified chronic kidney disease: Secondary | ICD-10-CM | POA: Diagnosis not present

## 2018-12-19 DIAGNOSIS — E782 Mixed hyperlipidemia: Secondary | ICD-10-CM | POA: Diagnosis not present

## 2018-12-19 DIAGNOSIS — E559 Vitamin D deficiency, unspecified: Secondary | ICD-10-CM | POA: Diagnosis not present

## 2019-01-15 DIAGNOSIS — M1712 Unilateral primary osteoarthritis, left knee: Secondary | ICD-10-CM | POA: Diagnosis not present

## 2019-01-15 DIAGNOSIS — M25562 Pain in left knee: Secondary | ICD-10-CM | POA: Diagnosis not present

## 2019-01-28 ENCOUNTER — Other Ambulatory Visit: Payer: Self-pay

## 2019-01-28 ENCOUNTER — Inpatient Hospital Stay (HOSPITAL_COMMUNITY)
Admission: EM | Admit: 2019-01-28 | Discharge: 2019-01-30 | DRG: 247 | Disposition: A | Discharge status: Home / Self Care | Payer: Medicare Other | Attending: Internal Medicine | Admitting: Internal Medicine

## 2019-01-28 ENCOUNTER — Encounter (HOSPITAL_COMMUNITY): Payer: Self-pay | Admitting: Emergency Medicine

## 2019-01-28 ENCOUNTER — Emergency Department (HOSPITAL_COMMUNITY): Payer: Medicare Other

## 2019-01-28 DIAGNOSIS — R410 Disorientation, unspecified: Secondary | ICD-10-CM | POA: Diagnosis not present

## 2019-01-28 DIAGNOSIS — Z20822 Contact with and (suspected) exposure to covid-19: Secondary | ICD-10-CM | POA: Diagnosis present

## 2019-01-28 DIAGNOSIS — Z955 Presence of coronary angioplasty implant and graft: Secondary | ICD-10-CM

## 2019-01-28 DIAGNOSIS — E7800 Pure hypercholesterolemia, unspecified: Secondary | ICD-10-CM | POA: Diagnosis present

## 2019-01-28 DIAGNOSIS — I10 Essential (primary) hypertension: Secondary | ICD-10-CM | POA: Diagnosis present

## 2019-01-28 DIAGNOSIS — R0789 Other chest pain: Secondary | ICD-10-CM | POA: Diagnosis not present

## 2019-01-28 DIAGNOSIS — Z66 Do not resuscitate: Secondary | ICD-10-CM | POA: Diagnosis not present

## 2019-01-28 DIAGNOSIS — J9811 Atelectasis: Secondary | ICD-10-CM | POA: Diagnosis not present

## 2019-01-28 DIAGNOSIS — E876 Hypokalemia: Secondary | ICD-10-CM | POA: Diagnosis not present

## 2019-01-28 DIAGNOSIS — I371 Nonrheumatic pulmonary valve insufficiency: Secondary | ICD-10-CM | POA: Diagnosis not present

## 2019-01-28 DIAGNOSIS — Z791 Long term (current) use of non-steroidal anti-inflammatories (NSAID): Secondary | ICD-10-CM

## 2019-01-28 DIAGNOSIS — I2510 Atherosclerotic heart disease of native coronary artery without angina pectoris: Secondary | ICD-10-CM | POA: Diagnosis not present

## 2019-01-28 DIAGNOSIS — I214 Non-ST elevation (NSTEMI) myocardial infarction: Principal | ICD-10-CM | POA: Diagnosis present

## 2019-01-28 DIAGNOSIS — R531 Weakness: Secondary | ICD-10-CM | POA: Diagnosis not present

## 2019-01-28 DIAGNOSIS — Z7982 Long term (current) use of aspirin: Secondary | ICD-10-CM | POA: Diagnosis not present

## 2019-01-28 DIAGNOSIS — E785 Hyperlipidemia, unspecified: Secondary | ICD-10-CM | POA: Diagnosis present

## 2019-01-28 DIAGNOSIS — I700 Atherosclerosis of aorta: Secondary | ICD-10-CM | POA: Diagnosis not present

## 2019-01-28 DIAGNOSIS — D849 Immunodeficiency, unspecified: Secondary | ICD-10-CM | POA: Diagnosis present

## 2019-01-28 DIAGNOSIS — R55 Syncope and collapse: Secondary | ICD-10-CM | POA: Diagnosis present

## 2019-01-28 HISTORY — DX: Immunodeficiency, unspecified: D84.9

## 2019-01-28 LAB — PROTIME-INR
INR: 0.9 (ref 0.8–1.2)
Prothrombin Time: 11.9 seconds (ref 11.4–15.2)

## 2019-01-28 LAB — LIPID PANEL
Cholesterol: 158 mg/dL (ref 0–200)
HDL: 57 mg/dL (ref >40)
LDL Cholesterol: 80 mg/dL (ref 0–99)
Total CHOL/HDL Ratio: 2.8 RATIO
Triglycerides: 104 mg/dL (ref <150)
VLDL: 21 mg/dL (ref 0–40)

## 2019-01-28 LAB — URINALYSIS, ROUTINE W REFLEX MICROSCOPIC
Bacteria, UA: NONE SEEN
Bilirubin Urine: NEGATIVE
Glucose, UA: NEGATIVE mg/dL
Ketones, ur: NEGATIVE mg/dL
Nitrite: NEGATIVE
Protein, ur: NEGATIVE mg/dL
Specific Gravity, Urine: 1.024 (ref 1.005–1.030)
WBC, UA: >50 WBC/hpf — ABNORMAL HIGH (ref 0–5)
pH: 5 (ref 5.0–8.0)

## 2019-01-28 LAB — RESPIRATORY PANEL BY RT PCR (FLU A&B, COVID)
Influenza A by PCR: NEGATIVE
Influenza B by PCR: NEGATIVE
SARS Coronavirus 2 by RT PCR: NEGATIVE

## 2019-01-28 LAB — BASIC METABOLIC PANEL
Anion gap: 13 (ref 5–15)
BUN: 31 mg/dL — ABNORMAL HIGH (ref 8–23)
CO2: 29 mmol/L (ref 22–32)
Calcium: 9.7 mg/dL (ref 8.9–10.3)
Chloride: 97 mmol/L — ABNORMAL LOW (ref 98–111)
Creatinine, Ser: 0.83 mg/dL (ref 0.44–1.00)
GFR calc Af Amer: >60 mL/min (ref >60)
GFR calc non Af Amer: >60 mL/min (ref >60)
Glucose, Bld: 156 mg/dL — ABNORMAL HIGH (ref 70–99)
Potassium: 3.2 mmol/L — ABNORMAL LOW (ref 3.5–5.1)
Sodium: 139 mmol/L (ref 135–145)

## 2019-01-28 LAB — CBC
HCT: 47.9 % — ABNORMAL HIGH (ref 36.0–46.0)
Hemoglobin: 15.3 g/dL — ABNORMAL HIGH (ref 12.0–15.0)
MCH: 29.9 pg (ref 26.0–34.0)
MCHC: 31.9 g/dL (ref 30.0–36.0)
MCV: 93.7 fL (ref 80.0–100.0)
Platelets: 289 10*3/uL (ref 150–400)
RBC: 5.11 MIL/uL (ref 3.87–5.11)
RDW: 12.8 % (ref 11.5–15.5)
WBC: 15.7 10*3/uL — ABNORMAL HIGH (ref 4.0–10.5)
nRBC: 0 % (ref 0.0–0.2)

## 2019-01-28 LAB — APTT: aPTT: 26 seconds (ref 24–36)

## 2019-01-28 LAB — TROPONIN I (HIGH SENSITIVITY)
Troponin I (High Sensitivity): 15182 ng/L (ref <18)
Troponin I (High Sensitivity): 14880 ng/L (ref <18)

## 2019-01-28 IMAGING — DX DG CHEST 1V PORT
1 series · 1 of 1 positions shown · non-contrast
Comparison: None.

CLINICAL DATA: Near syncope

EXAM:
PORTABLE CHEST 1 VIEW

[chest ap]
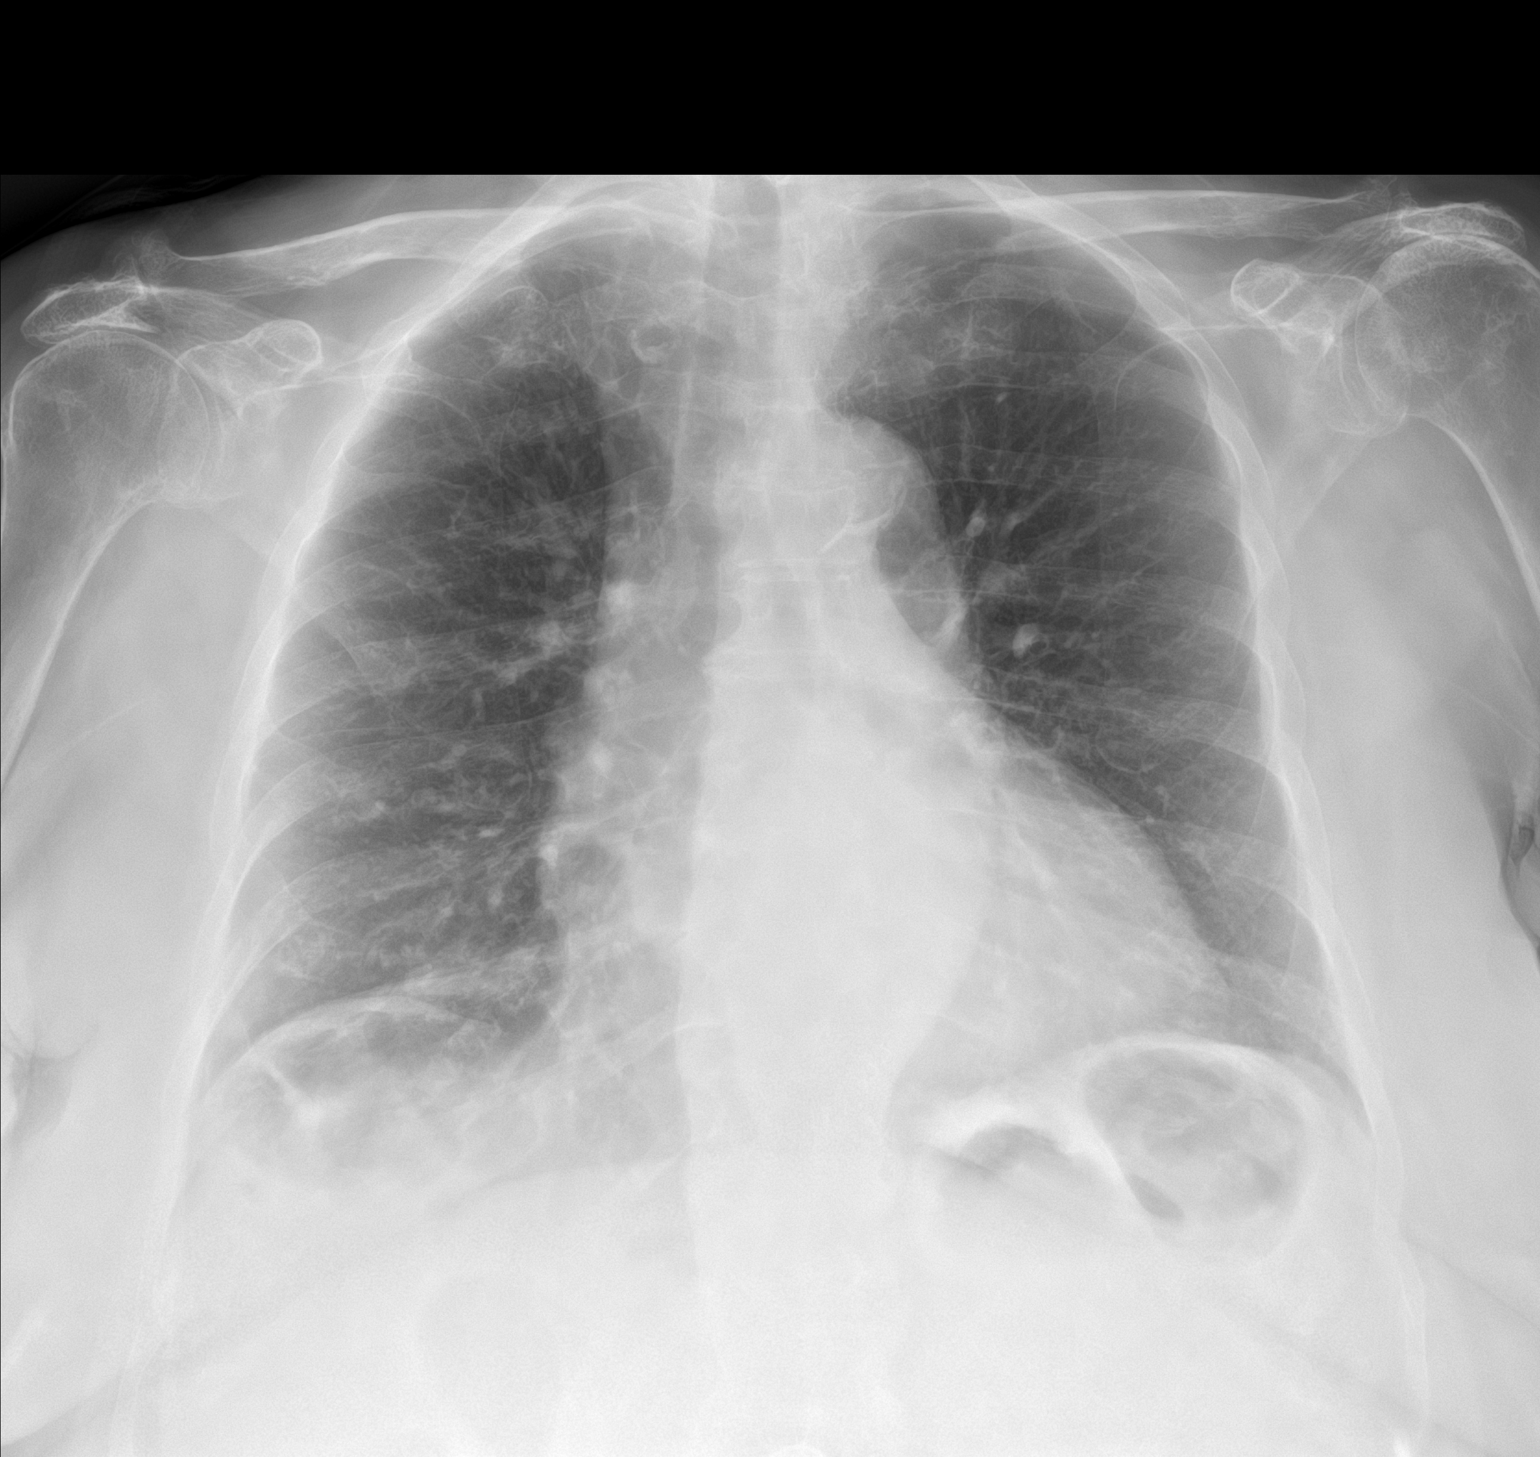

[1 of 1 positions shown; findings below may reference images not displayed]

FINDINGS: Colonic inter positioning beneath the right diaphragm. Mild
atelectasis at the right base. No consolidation or effusion. Heart
size upper limits of normal. Aortic atherosclerosis. No
pneumothorax.
IMPRESSION: No active disease.  Minimal basilar atelectasis on the right

## 2019-01-28 MED ORDER — ASPIRIN 81 MG PO CHEW
324.0000 mg | CHEWABLE_TABLET | Freq: Once | ORAL | Status: AC
  Administered 2019-01-28: 324 mg via ORAL
  Filled 2019-01-28: qty 4

## 2019-01-28 MED ORDER — HEPARIN (PORCINE) 25000 UT/250ML-% IV SOLN
800.0000 [IU]/h | INTRAVENOUS | Status: DC
  Administered 2019-01-28: 900 [IU]/h via INTRAVENOUS
  Filled 2019-01-28: qty 250

## 2019-01-28 MED ORDER — POTASSIUM CHLORIDE CRYS ER 20 MEQ PO TBCR
40.0000 meq | EXTENDED_RELEASE_TABLET | Freq: Once | ORAL | Status: AC
  Administered 2019-01-29: 40 meq via ORAL
  Filled 2019-01-28: qty 2

## 2019-01-28 MED ORDER — SODIUM CHLORIDE 0.9% FLUSH: 3.0000 mL | Freq: Once | INTRAVENOUS | Status: DC

## 2019-01-28 MED ORDER — SODIUM CHLORIDE 0.9 % IV SOLN: INTRAVENOUS | Status: DC

## 2019-01-28 MED ORDER — HEPARIN BOLUS VIA INFUSION
4000.0000 [IU] | Freq: Once | INTRAVENOUS | Status: AC
  Administered 2019-01-28: 4000 [IU] via INTRAVENOUS

## 2019-01-28 MED ORDER — SODIUM CHLORIDE 0.9 % IV SOLN
1.0000 g | Freq: Once | INTRAVENOUS | Status: AC
  Administered 2019-01-28: 1 g via INTRAVENOUS
  Filled 2019-01-28: qty 10

## 2019-01-28 NOTE — H&P (Signed)
History and Physical    Theresa Norman DZH:[id number] DOB: [birth year] DOA: 01/28/2019  PCP: Benita Stabile, MD   Patient coming from: Home   Chief Complaint: Chest pressure, near-syncope, N/V   HPI: Theresa Norman is a 79 y.o. female with medical history significant for hypertension and hyperlipidemia, now presenting to the emergency department for evaluation of chest pressure, near syncope, and nausea with nonbloody vomiting.  Patient reports that she had been in her usual state of health this morning until after eating breakfast when she developed a vague pressure sensation in the central chest.  She had some nausea with nonbloody vomiting associated with this but denies any diaphoresis or shortness of breath.  She was also feeling lightheaded as though she might pass out but did not actually lose consciousness.  The chest discomfort has since resolved, patient continues to deny shortness of breath, no longer having nausea, and denies any significant weakness.  No recent fevers or chills, dysuria, or flank pain.  She has never had these symptoms previously.  She was not planning to come to the ED, but was encouraged by family to be evaluated.  ED Course: Upon arrival to the ED, patient is found to be afebrile, saturating well on room air, and with stable blood pressure.  EKG features a sinus rhythm and chest x-ray is negative for acute cardiopulmonary disease.  Chemistry panel notable for potassium 3.2.  CBC features a leukocytosis to 15,700 and a slight polycythemia.  Initial troponin is 15,182 and second troponin is lower.  UA with no bacteria but large leukocytes and small hemoglobin.  Patient was treated with 324 mg of aspirin, 1 g IV Rocephin, and started on IV heparin in the ED.  Covid PCR is negative.  Cardiology was consulted by the ED physician and recommended medical admission and asked that cardiology be called again in the morning.  Review of Systems:  All other systems reviewed and  apart from HPI, are negative.  Past Medical History:  Diagnosis Date  . High cholesterol   . Immune deficiency disorder (HCC)     History reviewed. No pertinent surgical history.   reports that she has never smoked. She has never used smokeless tobacco. She reports that she does not drink alcohol or use drugs.  No Known Allergies  History reviewed. No pertinent family history.   Prior to Admission medications   Medication Sig Start Date End Date Taking? Authorizing Provider  aspirin 325 MG tablet Take 325 mg by mouth once as needed for mild pain or moderate pain.   Yes [provider]  aspirin EC 81 MG tablet Take 81 mg by mouth daily.   Yes [provider]  hydrochlorothiazide (HYDRODIURIL) 25 MG tablet Take 25 mg by mouth daily.   Yes [provider]  meloxicam (MOBIC) 15 MG tablet Take 15 mg by mouth daily.   Yes [provider]  simvastatin (ZOCOR) 40 MG tablet Take 40 mg by mouth daily.   Yes [provider]    Physical Exam: Vitals:   01/28/19 1324 01/28/19 1331  BP:  (!) 153/81  Pulse:  66  Resp:  18  Temp:  98.3 F (36.8 C)  TempSrc:  Oral  SpO2:  96%  Weight: 68 kg   Height: 5\' 1"  (1.549 m)      Constitutional: NAD, calm  Eyes: PERTLA, lids and conjunctivae normal ENMT: Mucous membranes are moist. Posterior pharynx clear of any exudate or lesions.   Neck: normal,  supple, no masses, no thyromegaly Respiratory: clear to auscultation bilaterally, no wheezing, no crackles. No accessory muscle use.  Cardiovascular: S1 & S2 heard, regular rate and rhythm. No extremity edema.   Abdomen: No distension, no tenderness, soft. Bowel sounds active.  Musculoskeletal: no clubbing / cyanosis. No joint deformity upper and lower extremities.   Skin: no significant rashes, lesions, ulcers. Warm, dry, well-perfused. Neurologic: CN 2-12 grossly intact. Sensation intact. Strength 5/5 in all 4 limbs.  Psychiatric: Alert and oriented to  person, place, and situation. Very pleasant and cooperative.    Labs and Imaging on Admission: I have personally reviewed following labs and imaging studies  CBC: Recent Labs  Lab 01/28/19 1501  WBC 15.7*  HGB 15.3*  HCT 47.9*  MCV 93.7  PLT 992   Basic Metabolic Panel: Recent Labs  Lab 01/28/19 1501  NA 139  K 3.2*  CL 97*  CO2 29  GLUCOSE 156*  BUN 31*  CREATININE 0.83  CALCIUM 9.7   GFR: Estimated Creatinine Clearance: 48.5 mL/min (by C-G formula based on SCr of 0.83 mg/dL). Liver Function Tests: No results for input(s): AST, ALT, ALKPHOS, BILITOT, PROT, ALBUMIN in the last 168 hours. No results for input(s): LIPASE, AMYLASE in the last 168 hours. No results for input(s): AMMONIA in the last 168 hours. Coagulation Profile: Recent Labs  Lab 01/28/19 2145  INR 0.9   Cardiac Enzymes: No results for input(s): CKTOTAL, CKMB, CKMBINDEX, TROPONINI in the last 168 hours. BNP (last 3 results) No results for input(s): PROBNP in the last 8760 hours. HbA1C: No results for input(s): HGBA1C in the last 72 hours. CBG: No results for input(s): GLUCAP in the last 168 hours. Lipid Profile: Recent Labs    01/28/19 2145  CHOL 158  HDL 57  LDLCALC 80  TRIG 104  CHOLHDL 2.8   Thyroid Function Tests: No results for input(s): TSH, T4TOTAL, FREET4, T3FREE, THYROIDAB in the last 72 hours. Anemia Panel: No results for input(s): VITAMINB12, FOLATE, FERRITIN, TIBC, IRON, RETICCTPCT in the last 72 hours. Urine analysis:    Component Value Date/Time   COLORURINE YELLOW 01/28/2019 2123   APPEARANCEUR HAZY (A) 01/28/2019 2123   LABSPEC 1.024 01/28/2019 2123   PHURINE 5.0 01/28/2019 2123   GLUCOSEU NEGATIVE 01/28/2019 2123   HGBUR SMALL (A) 01/28/2019 2123   BILIRUBINUR NEGATIVE 01/28/2019 2123   KETONESUR NEGATIVE 01/28/2019 2123   PROTEINUR NEGATIVE 01/28/2019 2123   NITRITE NEGATIVE 01/28/2019 2123   LEUKOCYTESUR LARGE (A) 01/28/2019 2123   Sepsis  Labs: @LABRCNTIP (procalcitonin:4,lacticidven:4) ) Recent Results (from the past 240 hour(s))  Respiratory Panel by RT PCR (Flu A&B, Covid) - Nasopharyngeal Swab     Status: None   Collection Time: 01/28/19  9:19 PM   Specimen: Nasopharyngeal Swab  Result Value Ref Range Status   SARS Coronavirus 2 by RT PCR NEGATIVE NEGATIVE Final    Comment: (NOTE) SARS-CoV-2 target nucleic acids are NOT DETECTED. The SARS-CoV-2 RNA is generally detectable in upper respiratoy specimens during the acute phase of infection. The lowest concentration of SARS-CoV-2 viral copies this assay can detect is 131 copies/mL. A negative result does not preclude SARS-Cov-2 infection and should not be used as the sole basis for treatment or other patient management decisions. A negative result may occur with  improper specimen collection/handling, submission of specimen other than nasopharyngeal swab, presence of viral mutation(s) within the areas targeted by this assay, and inadequate number of viral copies (<131 copies/mL). A negative result must be combined with clinical observations, patient  history, and epidemiological information. The expected result is Negative. Fact Sheet for Patients:  https://www.moore.com/ Fact Sheet for Healthcare Providers:  https://www.young.biz/ This test is not yet ap proved or cleared by the [place] FDA and  has been authorized for detection and/or diagnosis of SARS-CoV-2 by FDA under an Emergency Use Authorization (EUA). This EUA will remain  in effect (meaning this test can be used) for the duration of the COVID-19 declaration under Section 564(b)(1) of the Act, 21 U.S.C. section 360bbb-3(b)(1), unless the authorization is terminated or revoked sooner.    Influenza A by PCR NEGATIVE NEGATIVE Final   Influenza B by PCR NEGATIVE NEGATIVE Final    Comment: (NOTE) The Xpert Xpress SARS-CoV-2/FLU/RSV assay is intended as an aid in  the  diagnosis of influenza from Nasopharyngeal swab specimens and  should not be used as a sole basis for treatment. Nasal washings and  aspirates are unacceptable for Xpert Xpress SARS-CoV-2/FLU/RSV  testing. Fact Sheet for Patients: https://www.moore.com/ Fact Sheet for Healthcare Providers: https://www.young.biz/ This test is not yet approved or cleared by the [place] FDA and  has been authorized for detection and/or diagnosis of SARS-CoV-2 by  FDA under an Emergency Use Authorization (EUA). This EUA will remain  in effect (meaning this test can be used) for the duration of the  Covid-19 declaration under Section 564(b)(1) of the Act, 21  U.S.C. section 360bbb-3(b)(1), unless the authorization is  terminated or revoked. Performed at [facility], [address] [place], [place] [address]      Radiological Exams on Admission: DG Chest Portable 1 View  Result Date: 01/28/2019 CLINICAL DATA:  Near syncope EXAM: PORTABLE CHEST 1 VIEW COMPARISON:  None. FINDINGS: Colonic inter positioning beneath the right diaphragm. Mild atelectasis at the right base. No consolidation or effusion. Heart size upper limits of normal. Aortic atherosclerosis. No pneumothorax. IMPRESSION: No active disease.  Minimal basilar atelectasis on the right Electronically Signed   By: Jasmine Pang M.D.   On: 01/28/2019 19:12    EKG: Independently reviewed. Sinus rhythm, non-specific ST-T abnormality inferolaterally.   Assessment/Plan   1. Non-STEMI  - Presents with lightheadedness after an episode of chest pressure with N/V earlier in the day and is found to have marked elevation in troponin   - EKG with SR no STEMI, CXR unremarkable, and HS troponin peaked at 15,182  - She was treated with ASA in ED, started on IV heparin, and cardiology was consulted by ED physician  - Continue cardiac monitoring, she already took statin and ASA today will continue, repeat EKG    3.  Near-syncope - Patient had an episode of near-syncope this am associated with chest pressure and N/V  - She is noted to have elevated troponin and 34 mm Hg drop in SBP upon standing  - Continue cardiac monitoring, hold HCTZ, check echocardiogram   2. Hypertension  - BP at goal, hold HCTZ and use beta-blocker if needed for now   3. HLD  - Continue statin   4. Hypokalemia   - Replace, repeat chem panel in am     DVT prophylaxis: IV heparin  Code Status: DNR, confirmed with patient who states she has discussed this with family and paperwork at home  Family Communication: Discussed with patient  Consults called: Cardiology consulted by ED physician and recommended admission to medical service and consulting again in am  Admission status: Inpatient    Briscoe Deutscher, MD [facility] Pager: See [email or web address]  If 7AM-7PM, please contact the  daytime attending [email or web address]  01/29/2019, 12:03 AM

## 2019-01-28 NOTE — ED Notes (Signed)
Patient aware of urine sample,pt states she just can't go right now.

## 2019-01-28 NOTE — ED Provider Notes (Signed)
[facility] EMERGENCY DEPARTMENT Provider Note   CSN: [id number] Arrival date & time: 01/28/19  1316     History Chief Complaint  Patient presents with  . Near Syncope    Theresa Norman is a 79 y.o. female.  HPI      Theresa Norman is a 79 y.o. female who presents to the Emergency Department complaining of sudden onset of dizziness and near syncope that occurred this morning at 9 AM.  Symptoms began shortly after eating breakfast and resolved after 2 episodes of vomiting.  She describes having a "pressure" to her upper chest that also resolved after vomiting.  She states that her symptoms completely resolved shortly after vomiting, but was urged to come to the emergency department for evaluation by her family.  She also admits that she had a episode of feeling like her heart was racing last evening that spontaneously resolved.  No previous cardiac history.  She denies any symptoms at present including shortness of breath chest pain, abdominal pain.  No known Covid exposures.    PCP Dr. Wende Neighbors.  Past Medical History:  Diagnosis Date  . High cholesterol   . Immune deficiency disorder ([place])     There are no problems to display for this patient.   History reviewed. No pertinent surgical history.   OB History   No obstetric history on file.     No family history on file.  Social History   Tobacco Use  . Smoking status: Never Smoker  . Smokeless tobacco: Never Used  Substance Use Topics  . Alcohol use: Never  . Drug use: Never    Home Medications Prior to Admission medications   Medication Sig Start Date End Date Taking? Authorizing Provider  aspirin 325 MG tablet Take 325 mg by mouth once as needed for mild pain or moderate pain.   Yes [provider]  aspirin EC 81 MG tablet Take 81 mg by mouth daily.   Yes [provider]  hydrochlorothiazide (HYDRODIURIL) 25 MG tablet Take 25 mg by mouth daily.   Yes [provider]  meloxicam  (MOBIC) 15 MG tablet Take 15 mg by mouth daily.   Yes [provider]  simvastatin (ZOCOR) 40 MG tablet Take 40 mg by mouth daily.   Yes [provider]    Allergies    Patient has no known allergies.  Review of Systems   Review of Systems  Constitutional: Negative for chills, fatigue and fever.  HENT: Negative for sore throat and trouble swallowing.   Respiratory: Negative for cough, shortness of breath and wheezing.   Cardiovascular: Negative for chest pain and palpitations.  Gastrointestinal: Positive for nausea and vomiting. Negative for abdominal pain and diarrhea.  Genitourinary: Negative for dysuria, flank pain and hematuria.  Musculoskeletal: Negative for arthralgias, back pain, myalgias, neck pain and neck stiffness.  Skin: Negative for rash.  Neurological: Positive for dizziness. Negative for seizures, syncope, speech difficulty, weakness, numbness and headaches.  Hematological: Does not bruise/bleed easily.    Physical Exam Updated Vital Signs BP (!) 153/81 (BP Location: Right Arm)   Pulse 66   Temp 98.3 F (36.8 C) (Oral)   Resp 18   Ht 5\' 1"  (1.549 m)   Wt 68 kg   SpO2 96%   BMI 28.34 kg/m   Physical Exam Vitals and nursing note reviewed.  Constitutional:      General: She is not in acute distress.    Appearance: Normal appearance. She is not  toxic-appearing.  HENT:     Mouth/Throat:     Mouth: Mucous membranes are moist.     Pharynx: Oropharynx is clear.  Eyes:     Extraocular Movements: Extraocular movements intact.     Conjunctiva/sclera: Conjunctivae normal.     Pupils: Pupils are equal, round, and reactive to light.  Cardiovascular:     Rate and Rhythm: Normal rate and regular rhythm.     Pulses: Normal pulses.     Heart sounds: Normal heart sounds.  Pulmonary:     Breath sounds: Normal breath sounds.  Chest:     Chest wall: No tenderness.  Abdominal:     General: There is no distension.     Palpations: Abdomen is soft.      Tenderness: There is no abdominal tenderness. There is no guarding.  Musculoskeletal:        General: Normal range of motion.     Cervical back: Normal range of motion.     Right lower leg: No edema.     Left lower leg: No edema.  Skin:    General: Skin is warm.     Capillary Refill: Capillary refill takes less than 2 seconds.     Findings: No rash.  Neurological:     General: No focal deficit present.     Mental Status: She is alert.     GCS: GCS eye subscore is 4. GCS verbal subscore is 5. GCS motor subscore is 6.     Sensory: Sensation is intact. No sensory deficit.     Motor: Motor function is intact. No weakness.     Coordination: Coordination is intact.     Gait: Gait normal.     Comments: CN II-XII intact.  Speech clear, no pronator drift.  Normal finger-nose testing.     ED Results / Procedures / Treatments   Labs (all labs ordered are listed, but only abnormal results are displayed) Labs Reviewed  BASIC METABOLIC PANEL - Abnormal; Notable for the following components:      Result Value   Potassium 3.2 (*)    Chloride 97 (*)    Glucose, Bld 156 (*)    BUN 31 (*)    All other components within normal limits  CBC - Abnormal; Notable for the following components:   WBC 15.7 (*)    Hemoglobin 15.3 (*)    HCT 47.9 (*)    All other components within normal limits  URINALYSIS, ROUTINE W REFLEX MICROSCOPIC - Abnormal; Notable for the following components:   APPearance HAZY (*)    Hgb urine dipstick SMALL (*)    Leukocytes,Ua LARGE (*)    WBC, UA >50 (*)    All other components within normal limits  TROPONIN I (HIGH SENSITIVITY) - Abnormal; Notable for the following components:   Troponin I (High Sensitivity) 15,182 (*)    All other components within normal limits  RESPIRATORY PANEL BY RT PCR (FLU A&B, COVID)  HEMOGLOBIN A1C  PROTIME-INR  APTT  LIPID PANEL  HEPARIN LEVEL (UNFRACTIONATED)  CBC  TROPONIN I (HIGH SENSITIVITY)    EKG EKG  Interpretation  Date/Time:  Tuesday January 28 2019 13:34:28 EST Ventricular Rate:  68 PR Interval:  166 QRS Duration: 92 QT Interval:  448 QTC Calculation: 476 R Axis:   -29 Text Interpretation: Normal sinus rhythm Minimal voltage criteria for LVH, may be normal variant Borderline ECG Confirmed by Raeford Razor [phone]) on 01/28/2019 4:26:48 PM   Radiology DG Chest Portable 1 View  Result  Date: 01/28/2019 CLINICAL DATA:  Near syncope EXAM: PORTABLE CHEST 1 VIEW COMPARISON:  None. FINDINGS: Colonic inter positioning beneath the right diaphragm. Mild atelectasis at the right base. No consolidation or effusion. Heart size upper limits of normal. Aortic atherosclerosis. No pneumothorax. IMPRESSION: No active disease.  Minimal basilar atelectasis on the right Electronically Signed   By: Jasmine Pang M.D.   On: 01/28/2019 19:12    Procedures Procedures (including critical care time)  Medications Ordered in ED Medications  sodium chloride flush (NS) 0.9 % injection 3 mL (3 mLs Intravenous Not Given 01/28/19 1920)  0.9 %  sodium chloride infusion (has no administration in time range)  aspirin chewable tablet 324 mg (has no administration in time range)  heparin bolus via infusion 4,000 Units (has no administration in time range)  heparin ADULT infusion 100 units/mL (25000 units/240mL sodium chloride 0.45%) (has no administration in time range)  cefTRIAXone (ROCEPHIN) 1 g in sodium chloride 0.9 % 100 mL IVPB (has no administration in time range)    ED Course  I have reviewed the triage vital signs and the nursing notes.  Pertinent labs & imaging results that were available during my care of the patient were reviewed by me and considered in my medical decision making (see chart for details).  CRITICAL CARE Performed by: Roberth Berling Total critical care time: 45 minutes Critical care time was exclusive of separately billable procedures and treating other patients. Critical care was  necessary to treat or prevent imminent or life-threatening deterioration. Critical care was time spent personally by me on the following activities: development of treatment plan with patient and/or surrogate as well as nursing, discussions with consultants, evaluation of patient's response to treatment, examination of patient, obtaining history from patient or surrogate, ordering and performing treatments and interventions, ordering and review of laboratory studies, ordering and review of radiographic studies, pulse oximetry and re-evaluation of patient's condition.   MDM Rules/Calculators/A&P                     Patient here after near syncopal episode that occurred at 9 AM this morning.  She had some central chest pressure that she states resolved after 2 episodes of vomiting.  She denies symptoms at present.  On work-up patient noted to have a troponin greater than 15,000 as well as UTI.  Likely non-STEMI, will consult cardiology.  2250 Consulted Dr. Deforest Hoyles with cardiology.  Recommends pt be admitted to hospitalist service and consult with cards in AM for likely catheterization.  2315  Consulted hospitalist, Dr. Antionette Char.  He agrees to admit.    Final Clinical Impression(s) / ED Diagnoses Final diagnoses:  NSTEMI (non-ST elevated myocardial infarction) [facility])    Rx / DC Orders ED Discharge Orders    None       Rosey Bath 01/29/19 0011    Raeford Razor, MD 01/29/19 1551

## 2019-01-28 NOTE — ED Notes (Signed)
Date and time results received: 01/28/19 2243 (use smartphrase ".now" to insert current time)  Test: troponin Critical Value: 14,880  Name of Provider Notified: Dr. Juleen China informed at 2243  Orders Received? Or Actions Taken?: no/na

## 2019-01-28 NOTE — ED Triage Notes (Signed)
Pt reports she got dizzy and felt like she was gone to pass out.  Pt is awake alert and oriented x 4 at this time.  cbg 185

## 2019-01-28 NOTE — ED Notes (Signed)
Ambulated patient around nursing station,pt walked without assist,steady on feet.stated that she doesn't feel dizzy anymore.

## 2019-01-28 NOTE — ED Notes (Signed)
Pt refusing in and out cath.  

## 2019-01-28 NOTE — Progress Notes (Signed)
ANTICOAGULATION CONSULT NOTE - Initial Consult  Pharmacy Consult for heparin Indication: chest pain/ACS  No Known Allergies  Patient Measurements: Height: 5\' 1"  (154.9 cm) Weight: 150 lb (68 kg) IBW/kg (Calculated) : 47.8 Heparin Dosing Weight: 62kg  Vital Signs: Temp: 98.3 F (36.8 C) (01/19 1331) Temp Source: Oral (01/19 1331) BP: 153/81 (01/19 1331) Pulse Rate: 66 (01/19 1331)  Labs: Recent Labs    01/28/19 1501 01/28/19 2000  HGB 15.3*  --   HCT 47.9*  --   PLT 289  --   CREATININE 0.83  --   TROPONINIHS  --  15,182*    Estimated Creatinine Clearance: 48.5 mL/min (by C-G formula based on SCr of 0.83 mg/dL).   Medical History: Past Medical History:  Diagnosis Date  . High cholesterol   . Immune deficiency disorder (HCC)     Medications:  (Not in a hospital admission)  Scheduled:  . sodium chloride flush  3 mL Intravenous Once   Infusions:    Assessment: Pt presented with syncope. She is being r/o for ACS with an elevated troponin. IV heparin has been ordered.   Goal of Therapy:  Heparin level 0.3-0.7 units/ml Monitor platelets by anticoagulation protocol: Yes   Plan:  Heparin 4000 units x1 Heparin infusion at 900 units/hr 8 hr HL Daily HL and CBC  01/30/19, PharmD, BCIDP, AAHIVP, CPP Infectious Disease Pharmacist 01/28/2019 9:29 PM

## 2019-01-29 ENCOUNTER — Encounter (HOSPITAL_COMMUNITY)
Admission: EM | Disposition: A | Discharge status: Home / Self Care | Payer: Self-pay | Source: Home / Self Care | Attending: Internal Medicine

## 2019-01-29 ENCOUNTER — Inpatient Hospital Stay (HOSPITAL_COMMUNITY): Payer: Medicare Other

## 2019-01-29 DIAGNOSIS — I2510 Atherosclerotic heart disease of native coronary artery without angina pectoris: Secondary | ICD-10-CM

## 2019-01-29 DIAGNOSIS — I214 Non-ST elevation (NSTEMI) myocardial infarction: Principal | ICD-10-CM

## 2019-01-29 DIAGNOSIS — E7800 Pure hypercholesterolemia, unspecified: Secondary | ICD-10-CM

## 2019-01-29 DIAGNOSIS — I371 Nonrheumatic pulmonary valve insufficiency: Secondary | ICD-10-CM

## 2019-01-29 DIAGNOSIS — I10 Essential (primary) hypertension: Secondary | ICD-10-CM

## 2019-01-29 DIAGNOSIS — I700 Atherosclerosis of aorta: Secondary | ICD-10-CM

## 2019-01-29 HISTORY — DX: Atherosclerotic heart disease of native coronary artery without angina pectoris: I25.10

## 2019-01-29 HISTORY — PX: CORONARY PRESSURE/FFR STUDY: CATH118243

## 2019-01-29 HISTORY — PX: CORONARY STENT INTERVENTION: CATH118234

## 2019-01-29 HISTORY — PX: LEFT HEART CATH AND CORONARY ANGIOGRAPHY: CATH118249

## 2019-01-29 LAB — COMPREHENSIVE METABOLIC PANEL
ALT: 24 U/L (ref 0–44)
AST: 59 U/L — ABNORMAL HIGH (ref 15–41)
Albumin: 3.2 g/dL — ABNORMAL LOW (ref 3.5–5.0)
Alkaline Phosphatase: 47 U/L (ref 38–126)
Anion gap: 9 (ref 5–15)
BUN: 25 mg/dL — ABNORMAL HIGH (ref 8–23)
CO2: 26 mmol/L (ref 22–32)
Calcium: 8.9 mg/dL (ref 8.9–10.3)
Chloride: 102 mmol/L (ref 98–111)
Creatinine, Ser: 0.9 mg/dL (ref 0.44–1.00)
GFR calc Af Amer: >60 mL/min (ref >60)
GFR calc non Af Amer: >60 mL/min (ref >60)
Glucose, Bld: 110 mg/dL — ABNORMAL HIGH (ref 70–99)
Potassium: 3.6 mmol/L (ref 3.5–5.1)
Sodium: 137 mmol/L (ref 135–145)
Total Bilirubin: 0.5 mg/dL (ref 0.3–1.2)
Total Protein: 6.1 g/dL — ABNORMAL LOW (ref 6.5–8.1)

## 2019-01-29 LAB — HEPARIN LEVEL (UNFRACTIONATED): Heparin Unfractionated: 0.8 IU/mL — ABNORMAL HIGH (ref 0.30–0.70)

## 2019-01-29 LAB — CBC
HCT: 45.4 % (ref 36.0–46.0)
Hemoglobin: 15.2 g/dL — ABNORMAL HIGH (ref 12.0–15.0)
MCH: 30 pg (ref 26.0–34.0)
MCHC: 33.5 g/dL (ref 30.0–36.0)
MCV: 89.7 fL (ref 80.0–100.0)
Platelets: 234 10*3/uL (ref 150–400)
RBC: 5.06 MIL/uL (ref 3.87–5.11)
RDW: 13.2 % (ref 11.5–15.5)
WBC: 12.7 10*3/uL — ABNORMAL HIGH (ref 4.0–10.5)
nRBC: 0 % (ref 0.0–0.2)

## 2019-01-29 LAB — ECHOCARDIOGRAM COMPLETE
Height: 61 in
Weight: 2400 oz

## 2019-01-29 LAB — CREATININE, SERUM
Creatinine, Ser: 0.86 mg/dL (ref 0.44–1.00)
GFR calc Af Amer: >60 mL/min (ref >60)
GFR calc non Af Amer: >60 mL/min (ref >60)

## 2019-01-29 LAB — HEMOGLOBIN A1C
Hgb A1c MFr Bld: 5.8 % — ABNORMAL HIGH (ref 4.8–5.6)
Mean Plasma Glucose: 119.76 mg/dL

## 2019-01-29 LAB — POCT ACTIVATED CLOTTING TIME: Activated Clotting Time: 285 seconds

## 2019-01-29 SURGERY — LEFT HEART CATH AND CORONARY ANGIOGRAPHY
Anesthesia: LOCAL

## 2019-01-29 MED ORDER — ACETAMINOPHEN 325 MG PO TABS: 650.0000 mg | ORAL_TABLET | ORAL | Status: DC | PRN

## 2019-01-29 MED ORDER — SODIUM CHLORIDE 0.9 % WEIGHT BASED INFUSION: 1.0000 mL/kg/h | INTRAVENOUS | Status: AC

## 2019-01-29 MED ORDER — VERAPAMIL HCL 2.5 MG/ML IV SOLN
INTRAVENOUS | Status: DC | PRN
  Administered 2019-01-29: 10 mL via INTRA_ARTERIAL

## 2019-01-29 MED ORDER — HEPARIN (PORCINE) IN NACL 1000-0.9 UT/500ML-% IV SOLN
INTRAVENOUS | Status: DC | PRN
  Administered 2019-01-29 (×2): 500 mL

## 2019-01-29 MED ORDER — SODIUM CHLORIDE 0.9% FLUSH: 3.0000 mL | INTRAVENOUS | Status: DC | PRN

## 2019-01-29 MED ORDER — SODIUM CHLORIDE 0.9 % WEIGHT BASED INFUSION: 3.0000 mL/kg/h | INTRAVENOUS | Status: DC

## 2019-01-29 MED ORDER — SODIUM CHLORIDE 0.9 % IV SOLN: 250.0000 mL | INTRAVENOUS | Status: DC | PRN

## 2019-01-29 MED ORDER — LABETALOL HCL 5 MG/ML IV SOLN: 10.0000 mg | INTRAVENOUS | Status: AC | PRN

## 2019-01-29 MED ORDER — NITROGLYCERIN 0.4 MG SL SUBL: 0.4000 mg | SUBLINGUAL_TABLET | SUBLINGUAL | Status: DC | PRN

## 2019-01-29 MED ORDER — IOHEXOL 350 MG/ML SOLN
INTRAVENOUS | Status: DC | PRN
  Administered 2019-01-29: 135 mL via INTRA_ARTERIAL

## 2019-01-29 MED ORDER — SIMVASTATIN 20 MG PO TABS: 40.0000 mg | ORAL_TABLET | Freq: Every day | ORAL | Status: DC

## 2019-01-29 MED ORDER — LIDOCAINE HCL (PF) 1 % IJ SOLN
INTRAMUSCULAR | Status: AC
  Filled 2019-01-29: qty 30

## 2019-01-29 MED ORDER — ONDANSETRON HCL 4 MG/2ML IJ SOLN: 4.0000 mg | Freq: Four times a day (QID) | INTRAMUSCULAR | Status: DC | PRN

## 2019-01-29 MED ORDER — SODIUM CHLORIDE 0.9 % WEIGHT BASED INFUSION: 1.0000 mL/kg/h | INTRAVENOUS | Status: DC

## 2019-01-29 MED ORDER — ASPIRIN 81 MG PO CHEW: 81.0000 mg | CHEWABLE_TABLET | ORAL | Status: DC

## 2019-01-29 MED ORDER — MIDAZOLAM HCL 2 MG/2ML IJ SOLN
INTRAMUSCULAR | Status: DC | PRN
  Administered 2019-01-29: 1 mg via INTRAVENOUS

## 2019-01-29 MED ORDER — SODIUM CHLORIDE 0.9% FLUSH: 3.0000 mL | Freq: Two times a day (BID) | INTRAVENOUS | Status: DC

## 2019-01-29 MED ORDER — TICAGRELOR 90 MG PO TABS
ORAL_TABLET | ORAL | Status: AC
  Filled 2019-01-29: qty 2

## 2019-01-29 MED ORDER — FENTANYL CITRATE (PF) 100 MCG/2ML IJ SOLN
INTRAMUSCULAR | Status: DC | PRN
  Administered 2019-01-29: 25 ug via INTRAVENOUS

## 2019-01-29 MED ORDER — ENOXAPARIN SODIUM 40 MG/0.4ML ~~LOC~~ SOLN
40.0000 mg | SUBCUTANEOUS | Status: DC
  Administered 2019-01-30: 40 mg via SUBCUTANEOUS
  Filled 2019-01-29: qty 0.4

## 2019-01-29 MED ORDER — ATORVASTATIN CALCIUM 80 MG PO TABS
80.0000 mg | ORAL_TABLET | Freq: Every day | ORAL | Status: DC
  Administered 2019-01-29: 80 mg via ORAL
  Filled 2019-01-29: qty 1

## 2019-01-29 MED ORDER — MIDAZOLAM HCL 2 MG/2ML IJ SOLN
INTRAMUSCULAR | Status: AC
  Filled 2019-01-29: qty 2

## 2019-01-29 MED ORDER — TICAGRELOR 90 MG PO TABS
ORAL_TABLET | ORAL | Status: DC | PRN
  Administered 2019-01-29: 180 mg via ORAL

## 2019-01-29 MED ORDER — HEPARIN (PORCINE) IN NACL 1000-0.9 UT/500ML-% IV SOLN
INTRAVENOUS | Status: AC
  Filled 2019-01-29: qty 1000

## 2019-01-29 MED ORDER — NITROGLYCERIN 1 MG/10 ML FOR IR/CATH LAB
INTRA_ARTERIAL | Status: DC | PRN
  Administered 2019-01-29: 200 ug via INTRACORONARY

## 2019-01-29 MED ORDER — VERAPAMIL HCL 2.5 MG/ML IV SOLN
INTRAVENOUS | Status: AC
  Filled 2019-01-29: qty 2

## 2019-01-29 MED ORDER — TICAGRELOR 90 MG PO TABS
90.0000 mg | ORAL_TABLET | Freq: Two times a day (BID) | ORAL | Status: DC
  Administered 2019-01-29 – 2019-01-30 (×2): 90 mg via ORAL
  Filled 2019-01-29 (×2): qty 1

## 2019-01-29 MED ORDER — FENTANYL CITRATE (PF) 100 MCG/2ML IJ SOLN
INTRAMUSCULAR | Status: AC
  Filled 2019-01-29: qty 2

## 2019-01-29 MED ORDER — HEPARIN SODIUM (PORCINE) 1000 UNIT/ML IJ SOLN
INTRAMUSCULAR | Status: DC | PRN
  Administered 2019-01-29 (×2): 3500 [IU] via INTRAVENOUS

## 2019-01-29 MED ORDER — ASPIRIN EC 81 MG PO TBEC
81.0000 mg | DELAYED_RELEASE_TABLET | Freq: Every day | ORAL | Status: DC
  Administered 2019-01-29 – 2019-01-30 (×2): 81 mg via ORAL
  Filled 2019-01-29 (×2): qty 1

## 2019-01-29 MED ORDER — HEPARIN SODIUM (PORCINE) 1000 UNIT/ML IJ SOLN
INTRAMUSCULAR | Status: AC
  Filled 2019-01-29: qty 1

## 2019-01-29 MED ORDER — LIDOCAINE HCL (PF) 1 % IJ SOLN
INTRAMUSCULAR | Status: DC | PRN
  Administered 2019-01-29: 2 mL via INTRADERMAL

## 2019-01-29 MED ORDER — NITROGLYCERIN 1 MG/10 ML FOR IR/CATH LAB
INTRA_ARTERIAL | Status: AC
  Filled 2019-01-29: qty 10

## 2019-01-29 SURGICAL SUPPLY — 18 items
BALLN SAPPHIRE 2.5X12 (BALLOONS) ×2
BALLN SAPPHIRE ~~LOC~~ 2.75X12 (BALLOONS) ×1 IMPLANT
BALLOON SAPPHIRE 2.5X12 (BALLOONS) IMPLANT
CATH INFINITI 5FR JK (CATHETERS) ×1 IMPLANT
CATH VISTA GUIDE 6FR JL3.5 (CATHETERS) ×1 IMPLANT
DEVICE RAD TR BAND REGULAR (VASCULAR PRODUCTS) ×1 IMPLANT
GLIDESHEATH SLEND SS 6F .021 (SHEATH) ×1 IMPLANT
GUIDEWIRE INQWIRE 1.5J.035X260 (WIRE) IMPLANT
GUIDEWIRE PRESSURE COMET II (WIRE) ×1 IMPLANT
INQWIRE 1.5J .035X260CM (WIRE) ×2
KIT ENCORE 26 ADVANTAGE (KITS) ×1 IMPLANT
KIT ESSENTIALS PG (KITS) ×1 IMPLANT
KIT HEART LEFT (KITS) ×2 IMPLANT
PACK CARDIAC CATHETERIZATION (CUSTOM PROCEDURE TRAY) ×2 IMPLANT
STENT RESOLUTE ONYX 2.5X15 (Permanent Stent) ×1 IMPLANT
TRANSDUCER W/STOPCOCK (MISCELLANEOUS) ×2 IMPLANT
TUBING CIL FLEX 10 FLL-RA (TUBING) ×2 IMPLANT
WIRE RUNTHROUGH .014X180CM (WIRE) ×1 IMPLANT

## 2019-01-29 NOTE — ED Notes (Signed)
Theresa Norman made aware of bed assignment at this time . Carigan Lister

## 2019-01-29 NOTE — Interval H&P Note (Signed)
Cath Lab Visit (complete for each Cath Lab visit)  Clinical Evaluation Leading to the Procedure:   ACS: Yes.    Non-ACS:  n/a    History and Physical Interval Note:  01/29/2019 3:36 PM  Theresa Norman  has presented today for surgery, with the diagnosis of Nonstemi.  The various methods of treatment have been discussed with the patient and family. After consideration of risks, benefits and other options for treatment, the patient has consented to  Procedure(s): LEFT HEART CATH AND CORONARY ANGIOGRAPHY (N/A) as a surgical intervention.  The patient's history has been reviewed, patient examined, no change in status, stable for surgery.  I have reviewed the patient's chart and labs.  Questions were answered to the patient's satisfaction.     Lorine Bears

## 2019-01-29 NOTE — Progress Notes (Signed)
PROGRESS NOTE    [name]    Code Status: Full Code  OYD:[id number] DOB: 12-Jun-1940 DOA: 01/28/2019  PCP: Benita Stabile, MD    Hospital Summary  This is a 79 year old female with history of hypertension, hyperlipidemia who presented to the ED and was admitted on 1/19 with chest pressure, presyncope, nausea and epigastric discomfort with nonbloody vomiting found to have NSTEMI with troponin 15,182-> 14,880.  Treated with full dose aspirin, IV heparin and received Rocephin IV x1.  Symptoms resolved on 1/20.  Remained on telemetry without events.  Cardiology consulted and patient subsequently underwent cardiac catheterization 01/29/2019.  A & P   Principal Problem:   NSTEMI (non-ST elevated myocardial infarction) (HCC) Active Problems:   Essential hypertension   High cholesterol   1. NSTEMI a. Troponin 50,000 182-> 14,880 b. Currently hemodynamically stable and tolerating room air c. Continue heparin drip d. Cardiology consulted, patient currently in Cath Lab 2. Presyncopal episode secondary to NSTEMI resolved a. PT/OT eval once clinically improved b. Ambulates independently and lives alone at baseline 3. Hypertension a. Continue beta-blocker 4. Hyperlipidemia continue statin 5. Hypokalemia resolved   DVT prophylaxis: Heparin Diet: N.p.o. Family Communication: Patient's daughter has been updated at bedside Disposition Plan: Barriers to discharge includes cardiology consult, cardiac catheterization and PT/OT eval.  Likely discharge in next 24 hours  Consultants  Cardiology  Procedures  Cardiac catheterization 01/29/2019  Antibiotics   Anti-infectives (From admission, onward)   Start     Dose/Rate Route Frequency Ordered Stop   01/28/19 2200  cefTRIAXone (ROCEPHIN) 1 g in sodium chloride 0.9 % 100 mL IVPB     1 g 200 mL/hr over 30 Minutes Intravenous  Once 01/28/19 2154 01/29/19 0027           Subjective   Patient seen and examined at bedside in no acute  distress and resting comfortably. No acute events overnight. Denies any acute complaints at this time.  States that her symptoms have resolved.  Admits that she lives alone and ambulates independently without walker or wheelchair needed at baseline.  Objective   Vitals:   01/29/19 0244 01/29/19 0800 01/29/19 1418 01/29/19 1535  BP: (!) 156/83 127/67 136/70   Pulse: 71 66 69   Resp:  13    Temp: 98 F (36.7 C) 98.2 F (36.8 C) 98.2 F (36.8 C)   TempSrc:  Oral Oral   SpO2: 97% 96% 93% 97%  Weight:      Height:       No intake or output data in the 24 hours ending 01/29/19 1633 Filed Weights   01/28/19 1324  Weight: 68 kg    Examination:  Physical Exam Vitals and nursing note reviewed.  Constitutional:      Appearance: Normal appearance.  HENT:     Head: Normocephalic and atraumatic.     Nose: Nose normal.     Mouth/Throat:     Mouth: Mucous membranes are moist.  Eyes:     Conjunctiva/sclera: Conjunctivae normal.  Cardiovascular:     Rate and Rhythm: Normal rate and regular rhythm.  Pulmonary:     Effort: Pulmonary effort is normal.     Breath sounds: Normal breath sounds.  Abdominal:     General: Abdomen is flat.     Palpations: Abdomen is soft.  Musculoskeletal:        General: No swelling or tenderness.  Neurological:     Mental Status: She is alert. Mental status is at baseline.  Psychiatric:  Mood and Affect: Mood normal.        Behavior: Behavior normal.     Data Reviewed: I have personally reviewed following labs and imaging studies  CBC: Recent Labs  Lab 01/28/19 1501  WBC 15.7*  HGB 15.3*  HCT 47.9*  MCV 93.7  PLT 289   Basic Metabolic Panel: Recent Labs  Lab 01/28/19 1501 01/29/19 0530  NA 139 137  K 3.2* 3.6  CL 97* 102  CO2 29 26  GLUCOSE 156* 110*  BUN 31* 25*  CREATININE 0.83 0.90  CALCIUM 9.7 8.9   GFR: Estimated Creatinine Clearance: 44.7 mL/min (by C-G formula based on SCr of 0.9 mg/dL). Liver Function  Tests: Recent Labs  Lab 01/29/19 0530  AST 59*  ALT 24  ALKPHOS 47  BILITOT 0.5  PROT 6.1*  ALBUMIN 3.2*   No results for input(s): LIPASE, AMYLASE in the last 168 hours. No results for input(s): AMMONIA in the last 168 hours. Coagulation Profile: Recent Labs  Lab 01/28/19 2145  INR 0.9   Cardiac Enzymes: No results for input(s): CKTOTAL, CKMB, CKMBINDEX, TROPONINI in the last 168 hours. BNP (last 3 results) No results for input(s): PROBNP in the last 8760 hours. HbA1C: Recent Labs    01/28/19 2145  HGBA1C 5.8*   CBG: No results for input(s): GLUCAP in the last 168 hours. Lipid Profile: Recent Labs    01/28/19 2145  CHOL 158  HDL 57  LDLCALC 80  TRIG 104  CHOLHDL 2.8   Thyroid Function Tests: No results for input(s): TSH, T4TOTAL, FREET4, T3FREE, THYROIDAB in the last 72 hours. Anemia Panel: No results for input(s): VITAMINB12, FOLATE, FERRITIN, TIBC, IRON, RETICCTPCT in the last 72 hours. Sepsis Labs: No results for input(s): PROCALCITON, LATICACIDVEN in the last 168 hours.  Recent Results (from the past 240 hour(s))  Respiratory Panel by RT PCR (Flu A&B, Covid) - Nasopharyngeal Swab     Status: None   Collection Time: 01/28/19  9:19 PM   Specimen: Nasopharyngeal Swab  Result Value Ref Range Status   SARS Coronavirus 2 by RT PCR NEGATIVE NEGATIVE Final    Comment: (NOTE) SARS-CoV-2 target nucleic acids are NOT DETECTED. The SARS-CoV-2 RNA is generally detectable in upper respiratoy specimens during the acute phase of infection. The lowest concentration of SARS-CoV-2 viral copies this assay can detect is 131 copies/mL. A negative result does not preclude SARS-Cov-2 infection and should not be used as the sole basis for treatment or other patient management decisions. A negative result may occur with  improper specimen collection/handling, submission of specimen other than nasopharyngeal swab, presence of viral mutation(s) within the areas targeted by  this assay, and inadequate number of viral copies (<131 copies/mL). A negative result must be combined with clinical observations, patient history, and epidemiological information. The expected result is Negative. Fact Sheet for Patients:  https://www.moore.com/ Fact Sheet for Healthcare Providers:  https://www.young.biz/ This test is not yet ap proved or cleared by the [place] FDA and  has been authorized for detection and/or diagnosis of SARS-CoV-2 by FDA under an Emergency Use Authorization (EUA). This EUA will remain  in effect (meaning this test can be used) for the duration of the COVID-19 declaration under Section 564(b)(1) of the Act, 21 U.S.C. section 360bbb-3(b)(1), unless the authorization is terminated or revoked sooner.    Influenza A by PCR NEGATIVE NEGATIVE Final   Influenza B by PCR NEGATIVE NEGATIVE Final    Comment: (NOTE) The Xpert Xpress SARS-CoV-2/FLU/RSV assay is intended as an  aid in  the diagnosis of influenza from Nasopharyngeal swab specimens and  should not be used as a sole basis for treatment. Nasal washings and  aspirates are unacceptable for Xpert Xpress SARS-CoV-2/FLU/RSV  testing. Fact Sheet for Patients: PinkCheek.be Fact Sheet for Healthcare Providers: GravelBags.it This test is not yet approved or cleared by the [place] FDA and  has been authorized for detection and/or diagnosis of SARS-CoV-2 by  FDA under an Emergency Use Authorization (EUA). This EUA will remain  in effect (meaning this test can be used) for the duration of the  Covid-19 declaration under Section 564(b)(1) of the Act, 21  U.S.C. section 360bbb-3(b)(1), unless the authorization is  terminated or revoked. Performed at [facility], [address] [place], [place] [address]          Radiology Studies: DG Chest Portable 1 View  Result Date: 01/28/2019 CLINICAL DATA:   Near syncope EXAM: PORTABLE CHEST 1 VIEW COMPARISON:  None. FINDINGS: Colonic inter positioning beneath the right diaphragm. Mild atelectasis at the right base. No consolidation or effusion. Heart size upper limits of normal. Aortic atherosclerosis. No pneumothorax. IMPRESSION: No active disease.  Minimal basilar atelectasis on the right Electronically Signed   By: Donavan Foil M.D.   On: 01/28/2019 19:12   ECHOCARDIOGRAM COMPLETE  Result Date: 01/29/2019   ECHOCARDIOGRAM REPORT   Patient Name:   Theresa Norman Date of Exam: 01/29/2019 Medical Rec #:  [id number]       Height:       61.0 in Accession #:    [id number]      Weight:       150.0 lb Date of Birth:  1940-11-23       BSA:          1.67 m Patient Age:    71 years        BP:           127/67 mmHg Patient Gender: F               HR:           66 bpm. Exam Location:  Inpatient Procedure: 2D Echo Indications:    Elevated Troponin                 Syncope 780.2 / R55  History:        Patient has no prior history of Echocardiogram examinations.                 NSTEMI; Risk Factors:Hypertension and Dyslipidemia.  Sonographer:    Darlina Sicilian RDCS Referring Phys: [id number] [place]  1. Left ventricular ejection fraction, by visual estimation, is 55 to 60%. The left ventricle has normal function. There is moderately increased left ventricular hypertrophy.  2. The left ventricle has no regional wall motion abnormalities.  3. Global right ventricle has normal systolic function.The right ventricular size is normal. No increase in right ventricular wall thickness.  4. Left atrial size was normal.  5. Right atrial size was normal.  6. The mitral valve is normal in structure. No evidence of mitral valve regurgitation. No evidence of mitral stenosis.  7. The tricuspid valve is normal in structure.  8. The tricuspid valve is normal in structure. Tricuspid valve regurgitation is not demonstrated.  9. The aortic valve is normal in structure. Aortic valve  regurgitation is not visualized. Mild aortic valve sclerosis without stenosis. 10. Pulmonic regurgitation is mild. 11. The pulmonic valve was normal in structure.  Pulmonic valve regurgitation is mild. 12. The inferior vena cava is normal in size with greater than 50% respiratory variability, suggesting right atrial pressure of 3 mmHg. 13. The average left ventricular global longitudinal strain is -9.7 %. FINDINGS  Left Ventricle: Left ventricular ejection fraction, by visual estimation, is 55 to 60%. The left ventricle has normal function. The average left ventricular global longitudinal strain is -9.7 %. The left ventricle has no regional wall motion abnormalities. There is moderately increased left ventricular hypertrophy. Septal left ventricular hypertrophy. Normal left atrial pressure. Right Ventricle: The right ventricular size is normal. No increase in right ventricular wall thickness. Global RV systolic function is has normal systolic function. Left Atrium: Left atrial size was normal in size. Right Atrium: Right atrial size was normal in size Pericardium: There is no evidence of pericardial effusion. Mitral Valve: The mitral valve is normal in structure. No evidence of mitral valve regurgitation. No evidence of mitral valve stenosis by observation. Tricuspid Valve: The tricuspid valve is normal in structure. Tricuspid valve regurgitation is not demonstrated. Aortic Valve: The aortic valve is normal in structure. Aortic valve regurgitation is not visualized. Mild aortic valve sclerosis is present, with no evidence of aortic valve stenosis. Pulmonic Valve: The pulmonic valve was normal in structure. Pulmonic valve regurgitation is mild. Pulmonic regurgitation is mild. Aorta: The aortic root, ascending aorta and aortic arch are all structurally normal, with no evidence of dilitation or obstruction. Venous: The inferior vena cava is normal in size with greater than 50% respiratory variability, suggesting right  atrial pressure of 3 mmHg. IAS/Shunts: No atrial level shunt detected by color flow Doppler. There is no evidence of a patent foramen ovale. No ventricular septal defect is seen or detected. There is no evidence of an atrial septal defect.  LEFT VENTRICLE PLAX 2D LVIDd:         4.00 cm       Diastology LVIDs:         2.70 cm       LV e' lateral:   6.57 cm/s LV PW:         0.90 cm       LV E/e' lateral: 9.1 LV IVS:        1.50 cm       LV e' medial:    3.41 cm/s LVOT diam:     1.90 cm       LV E/e' medial:  17.6 LV SV:         43 ml LV SV Index:   24.80         2D Longitudinal Strain LVOT Area:     2.84 cm      2D Strain GLS Avg:     -9.7 %  LV Volumes (MOD) LV area d, A2C:    26.70 cm LV area d, A4C:    28.60 cm LV area s, A2C:    16.90 cm LV area s, A4C:    17.10 cm LV major d, A2C:   7.19 cm LV major d, A4C:   7.06 cm LV major s, A2C:   6.46 cm LV major s, A4C:   6.43 cm LV vol d, MOD A2C: 79.8 ml LV vol d, MOD A4C: 94.1 ml LV vol s, MOD A2C: 37.2 ml LV vol s, MOD A4C: 38.2 ml LV SV MOD A2C:     42.6 ml LV SV MOD A4C:     94.1 ml LV SV MOD BP:  49.7 ml RIGHT VENTRICLE RV S prime:     16.90 cm/s TAPSE (M-mode): 1.8 cm LEFT ATRIUM             Index       RIGHT ATRIUM           Index LA diam:        3.50 cm 2.09 cm/m  RA Area:     10.90 cm LA Vol (A2C):   44.7 ml 26.74 ml/m RA Volume:   20.50 ml  12.27 ml/m LA Vol (A4C):   49.7 ml 29.74 ml/m LA Biplane Vol: 48.5 ml 29.02 ml/m  AORTIC VALVE LVOT Vmax:   83.60 cm/s LVOT Vmean:  59.100 cm/s LVOT VTI:    0.185 m  AORTA Ao Root diam: 2.60 cm Ao Asc diam:  3.60 cm MITRAL VALVE MV Area (PHT): 3.77 cm             SHUNTS MV PHT:        58.29 msec           Systemic VTI:  0.18 m MV Decel Time: 201 msec             Systemic Diam: 1.90 cm MV E velocity: 60.00 cm/s 103 cm/s MV A velocity: 45.90 cm/s 70.3 cm/s MV E/A ratio:  1.31       1.5  Donato Schultz MD Electronically signed by Donato Schultz MD Signature Date/Time: 01/29/2019/2:06:38 PM    Final          Scheduled Meds: . Melene Muller ON 01/30/2019] aspirin  81 mg Oral Pre-Cath  . [MAR Hold] aspirin EC  81 mg Oral Daily  . [MAR Hold] simvastatin  40 mg Oral q1800  . [MAR Hold] sodium chloride flush  3 mL Intravenous Once  . [MAR Hold] sodium chloride flush  3 mL Intravenous Q12H   Continuous Infusions: . sodium chloride    . sodium chloride    . [START ON 01/30/2019] sodium chloride     Followed by  . [START ON 01/30/2019] sodium chloride    . heparin 800 Units/hr (01/29/19 0839)     LOS: 1 day    Time spent: 25 minutes with over 50% of the time coordinating the patient's care    Jae Dire, DO [facility] Pager [phone]  If 7PM-7AM, please contact night-coverage [email or web address] Password [facility] 01/29/2019, 4:33 PM

## 2019-01-29 NOTE — H&P (View-Only) (Signed)
Cardiology Consultation:   Patient ID: KENNIDY LAMKE MRN: [id number]; DOB: 1940-06-15  Admit date: 01/28/2019 Date of Consult: 01/29/2019  Primary Care Provider: Benita Stabile, MD Primary Cardiologist:  New (Eleri Ruben) Primary Electrophysiologist:  None    Patient Profile:   Theresa Norman is a 79 y.o. female with a hx of HTN and HLP who is being seen today for the evaluation of non-STEMI at the request of Dr. Antionette Char.  History of Present Illness:   Theresa Norman has been excellent health until yesterday morning, when she developed crushing chest pain, near syncope, recurrent vomiting and extreme weakness.  She was too weak to call for help and lives by herself.  By the time somebody came to her assistance, about 2 hours later, her symptoms had largely resolved except for some persistent weakness.  She had transient palpitations the evening before, but this was not associated with dizziness or syncope.  She has never experienced these symptoms before.  She denies fever, chills, cough, hemoptysis, leg swelling, orthopnea, PND, focal neurological events, bleeding problems or recent injuries, history of stroke or TIA.  She is currently asymptomatic.  Her initial high-sensitivity troponin was approximately 15,000, with a second assay being virtually identical.  Her ECG shows sinus rhythm with left axis deviation almost meeting criteria for left anterior fascicular block and the long QT at about 490 ms.  Her echocardiogram has not yet been formally reviewed, but on my evaluation shows normal left ventricular systolic function with a suggestion of basal inferior and basal inferolateral hypokinesis.  Heart Pathway Score:     Past Medical History:  Diagnosis Date  . High cholesterol   . Immune deficiency disorder (HCC)     History reviewed. No pertinent surgical history.   Home Medications:  Prior to Admission medications   Medication Sig Start Date End Date Taking? Authorizing Provider    aspirin 325 MG tablet Take 325 mg by mouth once as needed for mild pain or moderate pain.   Yes [provider]  aspirin EC 81 MG tablet Take 81 mg by mouth daily.   Yes [provider]  hydrochlorothiazide (HYDRODIURIL) 25 MG tablet Take 25 mg by mouth daily.   Yes [provider]  meloxicam (MOBIC) 15 MG tablet Take 15 mg by mouth daily.   Yes [provider]  simvastatin (ZOCOR) 40 MG tablet Take 40 mg by mouth daily.   Yes [provider]    Inpatient Medications: Scheduled Meds: . aspirin EC  81 mg Oral Daily  . simvastatin  40 mg Oral q1800  . sodium chloride flush  3 mL Intravenous Once   Continuous Infusions: . sodium chloride    . heparin 800 Units/hr (01/29/19 0839)   PRN Meds: acetaminophen, nitroGLYCERIN, ondansetron (ZOFRAN) IV  Allergies:   No Known Allergies  Social History:   Social History   Socioeconomic History  . Marital status: Married    Spouse name: Not on file  . Number of children: Not on file  . Years of education: Not on file  . Highest education level: Not on file  Occupational History  . Not on file  Tobacco Use  . Smoking status: Never Smoker  . Smokeless tobacco: Never Used  Substance and Sexual Activity  . Alcohol use: Never  . Drug use: Never  . Sexual activity: Not on file  Other Topics Concern  . Not on file  Social History Narrative  . Not on file   Social Determinants of  Health   Financial Resource Strain:   . Difficulty of Paying Living Expenses: Not on file  Food Insecurity:   . Worried About Charity fundraiser in the Last Year: Not on file  . Ran Out of Food in the Last Year: Not on file  Transportation Needs:   . Lack of Transportation (Medical): Not on file  . Lack of Transportation (Non-Medical): Not on file  Physical Activity:   . Days of Exercise per Week: Not on file  . Minutes of Exercise per Session: Not on file  Stress:   . Feeling of Stress : Not on file   Social Connections:   . Frequency of Communication with Friends and Family: Not on file  . Frequency of Social Gatherings with Friends and Family: Not on file  . Attends Religious Services: Not on file  . Active Member of Clubs or Organizations: Not on file  . Attends Archivist Meetings: Not on file  . Marital Status: Not on file  Intimate Partner Violence:   . Fear of Current or Ex-Partner: Not on file  . Emotionally Abused: Not on file  . Physically Abused: Not on file  . Sexually Abused: Not on file    Family History:   No FHx of CAD, other vascular disease  ROS:  Please see the history of present illness.  All other ROS reviewed and negative.     Physical Exam/Data:   Vitals:   01/29/19 0000 01/29/19 0130 01/29/19 0244 01/29/19 0800  BP: 131/67 (!) 118/52 (!) 156/83 127/67  Pulse: (!) 56 61 71 66  Resp: 14 14  13   Temp:   98 F (36.7 C) 98.2 F (36.8 C)  TempSrc:    Oral  SpO2: 96% 94% 97% 96%  Weight:      Height:       No intake or output data in the 24 hours ending 01/29/19 1342 Last 3 Weights 01/28/2019  Weight (lbs) 150 lb  Weight (kg) 68.04 kg     Body mass index is 28.34 kg/m.  General:  Well nourished, well developed, in no acute distress HEENT: normal Lymph: no adenopathy Neck: no JVD Endocrine:  No thryomegaly Vascular: No carotid bruits; FA pulses 2+ bilaterally without bruits  Cardiac:  normal S1, S2; RRR; no murmur  Lungs:  clear to auscultation bilaterally, no wheezing, rhonchi or rales  Abd: soft, nontender, no hepatomegaly  Ext: no edema Musculoskeletal:  No deformities, BUE and BLE strength normal and equal Skin: warm and dry  Neuro:  CNs 2-12 intact, no focal abnormalities noted Psych:  Normal affect   EKG:  The EKG was personally reviewed and demonstrates:  NSR, LAD (almost LAFB), +veR V2, long QTc, without other repol changes Telemetry:  Telemetry was personally reviewed and demonstrates:  NSR  Relevant CV Studies: Echo  (my review) normal LV function, subtle basal inferior/inferolateral hypokinesis, no valvular abn.  Laboratory Data:  High Sensitivity Troponin:   Recent Labs  Lab 01/28/19 2000 01/28/19 2145  TROPONINIHS 15,182* 14,880*     Chemistry Recent Labs  Lab 01/28/19 1501 01/29/19 0530  NA 139 137  K 3.2* 3.6  CL 97* 102  CO2 29 26  GLUCOSE 156* 110*  BUN 31* 25*  CREATININE 0.83 0.90  CALCIUM 9.7 8.9  GFRNONAA >60 >60  GFRAA >60 >60  ANIONGAP 13 9    Recent Labs  Lab 01/29/19 0530  PROT 6.1*  ALBUMIN 3.2*  AST 59*  ALT 24  ALKPHOS  47  BILITOT 0.5   Hematology Recent Labs  Lab 01/28/19 1501  WBC 15.7*  RBC 5.11  HGB 15.3*  HCT 47.9*  MCV 93.7  MCH 29.9  MCHC 31.9  RDW 12.8  PLT 289   BNPNo results for input(s): BNP, PROBNP in the last 168 hours.  DDimer No results for input(s): DDIMER in the last 168 hours.   Radiology/Studies:  DG Chest Portable 1 View  Result Date: 01/28/2019 CLINICAL DATA:  Near syncope EXAM: PORTABLE CHEST 1 VIEW COMPARISON:  None. FINDINGS: Colonic inter positioning beneath the right diaphragm. Mild atelectasis at the right base. No consolidation or effusion. Heart size upper limits of normal. Aortic atherosclerosis. No pneumothorax. IMPRESSION: No active disease.  Minimal basilar atelectasis on the right Electronically Signed   By: Kim  Fujinaga M.D.   On: 01/28/2019 19:12       TIMI Risk Score for Unstable Angina or Non-ST Elevation MI:   The patient's TIMI risk score is 3, which indicates a 13% risk of all cause mortality, new or recurrent myocardial infarction or need for urgent revascularization in the next 14 days.   Assessment and Plan:   1. NSTEMI: troponins peaked at 15000, small area of hypokinesis on echo, no arrhythmia, no CHF. For cardiac cath today. This procedure has been fully reviewed with the patient and written informed consent has been obtained. On IV heparin. 2. Ao atherosclerosis: seen on CXR. 3. HLP: LDL 80 on  current meds. Switching to atorvastatin 80 mg daily, which should achieve LDL<70. 4. HTN: excellent control on HCTZ. Beta blockers may not be tolerated due to bradycardia. An ACEi or ARB may be superior to thiazide in long term.      For questions or updates, please contact [facility] Please consult [email or web address] for contact info under     Signed, Tamzin Bertling, MD  01/29/2019 1:42 PM   

## 2019-01-29 NOTE — ED Notes (Signed)
Report given to Tiffany with [facility] 

## 2019-01-29 NOTE — ED Notes (Signed)
DNR bracelet placed on pt.  

## 2019-01-29 NOTE — Consult Note (Signed)
Cardiology Consultation:   Patient ID: KENNIDY LAMKE MRN: [id number]; DOB: 1940-06-15  Admit date: 01/28/2019 Date of Consult: 01/29/2019  Primary Care Provider: Benita Stabile, MD Primary Cardiologist:  New (Lirio Bach) Primary Electrophysiologist:  None    Patient Profile:   Theresa Norman is a 79 y.o. female with a hx of HTN and HLP who is being seen today for the evaluation of non-STEMI at the request of Dr. Antionette Char.  History of Present Illness:   Theresa Norman has been excellent health until yesterday morning, when she developed crushing chest pain, near syncope, recurrent vomiting and extreme weakness.  She was too weak to call for help and lives by herself.  By the time somebody came to her assistance, about 2 hours later, her symptoms had largely resolved except for some persistent weakness.  She had transient palpitations the evening before, but this was not associated with dizziness or syncope.  She has never experienced these symptoms before.  She denies fever, chills, cough, hemoptysis, leg swelling, orthopnea, PND, focal neurological events, bleeding problems or recent injuries, history of stroke or TIA.  She is currently asymptomatic.  Her initial high-sensitivity troponin was approximately 15,000, with a second assay being virtually identical.  Her ECG shows sinus rhythm with left axis deviation almost meeting criteria for left anterior fascicular block and the long QT at about 490 ms.  Her echocardiogram has not yet been formally reviewed, but on my evaluation shows normal left ventricular systolic function with a suggestion of basal inferior and basal inferolateral hypokinesis.  Heart Pathway Score:     Past Medical History:  Diagnosis Date  . High cholesterol   . Immune deficiency disorder (HCC)     History reviewed. No pertinent surgical history.   Home Medications:  Prior to Admission medications   Medication Sig Start Date End Date Taking? Authorizing Provider    aspirin 325 MG tablet Take 325 mg by mouth once as needed for mild pain or moderate pain.   Yes [provider]  aspirin EC 81 MG tablet Take 81 mg by mouth daily.   Yes [provider]  hydrochlorothiazide (HYDRODIURIL) 25 MG tablet Take 25 mg by mouth daily.   Yes [provider]  meloxicam (MOBIC) 15 MG tablet Take 15 mg by mouth daily.   Yes [provider]  simvastatin (ZOCOR) 40 MG tablet Take 40 mg by mouth daily.   Yes [provider]    Inpatient Medications: Scheduled Meds: . aspirin EC  81 mg Oral Daily  . simvastatin  40 mg Oral q1800  . sodium chloride flush  3 mL Intravenous Once   Continuous Infusions: . sodium chloride    . heparin 800 Units/hr (01/29/19 0839)   PRN Meds: acetaminophen, nitroGLYCERIN, ondansetron (ZOFRAN) IV  Allergies:   No Known Allergies  Social History:   Social History   Socioeconomic History  . Marital status: Married    Spouse name: Not on file  . Number of children: Not on file  . Years of education: Not on file  . Highest education level: Not on file  Occupational History  . Not on file  Tobacco Use  . Smoking status: Never Smoker  . Smokeless tobacco: Never Used  Substance and Sexual Activity  . Alcohol use: Never  . Drug use: Never  . Sexual activity: Not on file  Other Topics Concern  . Not on file  Social History Narrative  . Not on file   Social Determinants of  Health   Financial Resource Strain:   . Difficulty of Paying Living Expenses: Not on file  Food Insecurity:   . Worried About Charity fundraiser in the Last Year: Not on file  . Ran Out of Food in the Last Year: Not on file  Transportation Needs:   . Lack of Transportation (Medical): Not on file  . Lack of Transportation (Non-Medical): Not on file  Physical Activity:   . Days of Exercise per Week: Not on file  . Minutes of Exercise per Session: Not on file  Stress:   . Feeling of Stress : Not on file   Social Connections:   . Frequency of Communication with Friends and Family: Not on file  . Frequency of Social Gatherings with Friends and Family: Not on file  . Attends Religious Services: Not on file  . Active Member of Clubs or Organizations: Not on file  . Attends Archivist Meetings: Not on file  . Marital Status: Not on file  Intimate Partner Violence:   . Fear of Current or Ex-Partner: Not on file  . Emotionally Abused: Not on file  . Physically Abused: Not on file  . Sexually Abused: Not on file    Family History:   No FHx of CAD, other vascular disease  ROS:  Please see the history of present illness.  All other ROS reviewed and negative.     Physical Exam/Data:   Vitals:   01/29/19 0000 01/29/19 0130 01/29/19 0244 01/29/19 0800  BP: 131/67 (!) 118/52 (!) 156/83 127/67  Pulse: (!) 56 61 71 66  Resp: 14 14  13   Temp:   98 F (36.7 C) 98.2 F (36.8 C)  TempSrc:    Oral  SpO2: 96% 94% 97% 96%  Weight:      Height:       No intake or output data in the 24 hours ending 01/29/19 1342 Last 3 Weights 01/28/2019  Weight (lbs) 150 lb  Weight (kg) 68.04 kg     Body mass index is 28.34 kg/m.  General:  Well nourished, well developed, in no acute distress HEENT: normal Lymph: no adenopathy Neck: no JVD Endocrine:  No thryomegaly Vascular: No carotid bruits; FA pulses 2+ bilaterally without bruits  Cardiac:  normal S1, S2; RRR; no murmur  Lungs:  clear to auscultation bilaterally, no wheezing, rhonchi or rales  Abd: soft, nontender, no hepatomegaly  Ext: no edema Musculoskeletal:  No deformities, BUE and BLE strength normal and equal Skin: warm and dry  Neuro:  CNs 2-12 intact, no focal abnormalities noted Psych:  Normal affect   EKG:  The EKG was personally reviewed and demonstrates:  NSR, LAD (almost LAFB), +veR V2, long QTc, without other repol changes Telemetry:  Telemetry was personally reviewed and demonstrates:  NSR  Relevant CV Studies: Echo  (my review) normal LV function, subtle basal inferior/inferolateral hypokinesis, no valvular abn.  Laboratory Data:  High Sensitivity Troponin:   Recent Labs  Lab 01/28/19 2000 01/28/19 2145  TROPONINIHS 15,182* 14,880*     Chemistry Recent Labs  Lab 01/28/19 1501 01/29/19 0530  NA 139 137  K 3.2* 3.6  CL 97* 102  CO2 29 26  GLUCOSE 156* 110*  BUN 31* 25*  CREATININE 0.83 0.90  CALCIUM 9.7 8.9  GFRNONAA >60 >60  GFRAA >60 >60  ANIONGAP 13 9    Recent Labs  Lab 01/29/19 0530  PROT 6.1*  ALBUMIN 3.2*  AST 59*  ALT 24  ALKPHOS  47  BILITOT 0.5   Hematology Recent Labs  Lab 01/28/19 1501  WBC 15.7*  RBC 5.11  HGB 15.3*  HCT 47.9*  MCV 93.7  MCH 29.9  MCHC 31.9  RDW 12.8  PLT 289   BNPNo results for input(s): BNP, PROBNP in the last 168 hours.  DDimer No results for input(s): DDIMER in the last 168 hours.   Radiology/Studies:  DG Chest Portable 1 View  Result Date: 01/28/2019 CLINICAL DATA:  Near syncope EXAM: PORTABLE CHEST 1 VIEW COMPARISON:  None. FINDINGS: Colonic inter positioning beneath the right diaphragm. Mild atelectasis at the right base. No consolidation or effusion. Heart size upper limits of normal. Aortic atherosclerosis. No pneumothorax. IMPRESSION: No active disease.  Minimal basilar atelectasis on the right Electronically Signed   By: Jasmine Pang M.D.   On: 01/28/2019 19:12       TIMI Risk Score for Unstable Angina or Non-ST Elevation MI:   The patient's TIMI risk score is 3, which indicates a 13% risk of all cause mortality, new or recurrent myocardial infarction or need for urgent revascularization in the next 14 days.   Assessment and Plan:   1. NSTEMI: troponins peaked at 15000, small area of hypokinesis on echo, no arrhythmia, no CHF. For cardiac cath today. This procedure has been fully reviewed with the patient and written informed consent has been obtained. On IV heparin. 2. Ao atherosclerosis: seen on CXR. 3. HLP: LDL 80 on  current meds. Switching to atorvastatin 80 mg daily, which should achieve LDL<70. 4. HTN: excellent control on HCTZ. Beta blockers may not be tolerated due to bradycardia. An ACEi or ARB may be superior to thiazide in long term.      For questions or updates, please contact [facility] Please consult [email or web address] for contact info under     Signed, Thurmon Fair, MD  01/29/2019 1:42 PM

## 2019-01-29 NOTE — Progress Notes (Signed)
  Echocardiogram 2D Echocardiogram has been performed.  Leta Jungling M 01/29/2019, 11:27 AM

## 2019-01-29 NOTE — Progress Notes (Signed)
ANTICOAGULATION CONSULT NOTE  Pharmacy Consult for heparin Indication: chest pain/ACS  No Known Allergies  Patient Measurements: Height: 5\' 1"  (154.9 cm) Weight: 150 lb (68 kg) IBW/kg (Calculated) : 47.8 Heparin Dosing Weight: 62kg  Vital Signs: Temp: 98.2 F (36.8 C) (01/20 0800) Temp Source: Oral (01/20 0800) BP: 127/67 (01/20 0800) Pulse Rate: 66 (01/20 0800)  Labs: Recent Labs    01/28/19 1501 01/28/19 2000 01/28/19 2145 01/29/19 0530 01/29/19 0652  HGB 15.3*  --   --   --   --   HCT 47.9*  --   --   --   --   PLT 289  --   --   --   --   APTT  --   --  26  --   --   LABPROT  --   --  11.9  --   --   INR  --   --  0.9  --   --   HEPARINUNFRC  --   --   --   --  0.80*  CREATININE 0.83  --   --  0.90  --   TROPONINIHS  --  15,182* 14,880*  --   --     Estimated Creatinine Clearance: 44.7 mL/min (by C-G formula based on SCr of 0.9 mg/dL).   Medical History: Past Medical History:  Diagnosis Date  . High cholesterol   . Immune deficiency disorder (HCC)     Medications:  Medications Prior to Admission  Medication Sig Dispense Refill Last Dose  . aspirin 325 MG tablet Take 325 mg by mouth once as needed for mild pain or moderate pain.   01/28/2019 at Unknown time  . aspirin EC 81 MG tablet Take 81 mg by mouth daily.   01/28/2019 at Unknown time  . hydrochlorothiazide (HYDRODIURIL) 25 MG tablet Take 25 mg by mouth daily.   01/28/2019 at Unknown time  . meloxicam (MOBIC) 15 MG tablet Take 15 mg by mouth daily.   01/28/2019 at Unknown time  . simvastatin (ZOCOR) 40 MG tablet Take 40 mg by mouth daily.   01/28/2019 at Unknown time   Scheduled:  . aspirin EC  81 mg Oral Daily  . simvastatin  40 mg Oral q1800  . sodium chloride flush  3 mL Intravenous Once   Infusions:  . sodium chloride    . heparin 900 Units/hr (01/28/19 2248)    Assessment: Pt presented with syncope. She is being r/o for ACS with an elevated troponin. IV heparin has been ordered. No AC  PTA.  Heparin level came back supratherapeutic at 0.8, on 900 units/hr. Hgb 15.3, plt 289. Trop up to 14,880. No s/sx of bleeding or infusion issues.   Goal of Therapy:  Heparin level 0.3-0.7 units/ml Monitor platelets by anticoagulation protocol: Yes   Plan:  Reduce heparin infusion to 800 units/hr 8 hr HL Daily HL and CBC F/u cardiac workup  01/30/19, PharmD, BCCCP Clinical Pharmacist  Phone: [phone]  Please check AMION for all [facility] Pharmacy phone numbers After 10:00 PM, call Main Pharmacy [phone] 01/29/2019 8:09 AM

## 2019-01-30 ENCOUNTER — Encounter (HOSPITAL_COMMUNITY): Payer: Self-pay | Admitting: Ophthalmology

## 2019-01-30 LAB — BASIC METABOLIC PANEL
Anion gap: 11 (ref 5–15)
BUN: 19 mg/dL (ref 8–23)
CO2: 22 mmol/L (ref 22–32)
Calcium: 8.7 mg/dL — ABNORMAL LOW (ref 8.9–10.3)
Chloride: 106 mmol/L (ref 98–111)
Creatinine, Ser: 0.92 mg/dL (ref 0.44–1.00)
GFR calc Af Amer: >60 mL/min (ref >60)
GFR calc non Af Amer: 59 mL/min — ABNORMAL LOW (ref >60)
Glucose, Bld: 100 mg/dL — ABNORMAL HIGH (ref 70–99)
Potassium: 3.5 mmol/L (ref 3.5–5.1)
Sodium: 139 mmol/L (ref 135–145)

## 2019-01-30 LAB — CBC
HCT: 41.9 % (ref 36.0–46.0)
Hemoglobin: 13.7 g/dL (ref 12.0–15.0)
MCH: 30.3 pg (ref 26.0–34.0)
MCHC: 32.7 g/dL (ref 30.0–36.0)
MCV: 92.7 fL (ref 80.0–100.0)
Platelets: 227 10*3/uL (ref 150–400)
RBC: 4.52 MIL/uL (ref 3.87–5.11)
RDW: 13.3 % (ref 11.5–15.5)
WBC: 10.7 10*3/uL — ABNORMAL HIGH (ref 4.0–10.5)
nRBC: 0 % (ref 0.0–0.2)

## 2019-01-30 LAB — URINE CULTURE

## 2019-01-30 LAB — MAGNESIUM: Magnesium: 1.8 mg/dL (ref 1.7–2.4)

## 2019-01-30 MED ORDER — METOPROLOL SUCCINATE ER 25 MG PO TB24: 12.5000 mg | ORAL_TABLET | Freq: Every day | ORAL | 0 refills | Status: DC

## 2019-01-30 MED ORDER — TICAGRELOR 90 MG PO TABS: 90.0000 mg | ORAL_TABLET | Freq: Two times a day (BID) | ORAL | 1 refills | Status: DC

## 2019-01-30 MED ORDER — METOPROLOL SUCCINATE ER 25 MG PO TB24
12.5000 mg | ORAL_TABLET | Freq: Every day | ORAL | Status: DC
  Administered 2019-01-30: 12.5 mg via ORAL
  Filled 2019-01-30: qty 1

## 2019-01-30 MED ORDER — ATORVASTATIN CALCIUM 80 MG PO TABS: 80.0000 mg | ORAL_TABLET | Freq: Every day | ORAL | 0 refills | Status: DC

## 2019-01-30 MED ORDER — NITROGLYCERIN 0.4 MG SL SUBL: 0.4000 mg | SUBLINGUAL_TABLET | SUBLINGUAL | 0 refills | Status: DC | PRN

## 2019-01-30 MED FILL — NITROGLYCERIN 0.4 MG TAB SL: 0.4 | 8 days supply | Qty: 25 | Fill #0

## 2019-01-30 MED FILL — ATORVASTATIN CALCIUM 80 MG: 80 | 60 days supply | Qty: 60 | Fill #0

## 2019-01-30 MED FILL — METOPROLOL SUCCINATE ER 25: 25 | 60 days supply | Qty: 30 | Fill #0

## 2019-01-30 MED FILL — BRILINTA 90 MG TABLET: 90 | 30 days supply | Qty: 60 | Fill #0

## 2019-01-30 NOTE — Care Management (Signed)
1015 01-30-19 Benefits Check submitted for Brilinta. Case Manger will discuss the price with the patient and provide a 30 day free card. No further needs from Case Manager at this time. Graves-Bigelow, Lamar Laundry, RN, BSN Case Manager

## 2019-01-30 NOTE — Progress Notes (Signed)
Removed TR band from right radial. Site is a level 1 with bruising. Applied gauze and Tegaderm to site and educated to leave in place for 24 hrs. Educated that right arm is still NWB. Pt verbalized understanding.

## 2019-01-30 NOTE — Discharge Summary (Signed)
Physician Discharge Summary  Theresa Norman RFF:[id number] DOB: 1940-11-01 DOA: 01/28/2019  PCP: Benita Stabile, MD  Admit date: 01/28/2019 Discharge date: 01/30/2019   Code Status: Full Code  Admitted From: home Discharged to:  home Home Health:none  Equipment/Devices:none  Discharge Condition:stable   Recommendations for Outpatient Follow-up   1. Close follow up with cardiology 2. Started on Dual Antiplatelets and need to continue for 12 months 3. Zocor changed to Atorvastatin 80 mg  4. HCTZ discontinued, changed to Toprol XL 5. Follow up Lipid panel ordered for 6 weeks   Hospital Summary  This is a 79 year old female with history of hypertension, hyperlipidemia who presented to the ED and was admitted on 1/19 with chest pressure, presyncope, nausea and epigastric discomfort with nonbloody vomiting found to have NSTEMI with troponin 15,182-> 14,880.  Treated with full dose aspirin, IV heparin and received Rocephin IV x1.  Symptoms resolved on 1/20.  Remained on telemetry without events.  Cardiology consulted and patient subsequently underwent cardiac catheterization 01/29/2019. Proximal LAD 70% stenosed and had DES placed. 3rd RPL lesion 90% stenosed, not felt to be culprit lesion, no intervention. Started on dual antiplatelets, high dose statin, HCTZ changed to beta blocker  A & P   Principal Problem:   NSTEMI (non-ST elevated myocardial infarction) (HCC) Active Problems:   Essential hypertension   High cholesterol    1. NSTEMI 2. CAD s/p DES (01/29/19) a. Troponin 50,000 182-> 14,880 on admission, started on heparin drip b. Currently hemodynamically stable and tolerating room air c. Cardiology consulted, went to cath lab 01/29/19: Proximal LAD 70% stenosed and had DES placed. 3rd RPL lesion 90% stenosed, not felt to be culprit lesion, no intervention. d. Started on aspirin and brilinta e. Zocor changed to atorvastatin 80 mg  f. HCTZ changed to Low dose Toprol XL g. Close  follow up with cardiology outpatient 3. Presyncopal episode secondary to NSTEMI resolved a. No need for PT/OT eval.  b. Ambulates independently and lives alone, at baseline 4. Hypertension a. As above  5. Hyperlipidemia continue statin 6. Hypokalemia resolved     Consultants  . Cardiology   Procedures  . Left heart cath 01/29/19  Antibiotics   Anti-infectives (From admission, onward)   Start     Dose/Rate Route Frequency Ordered Stop   01/28/19 2200  cefTRIAXone (ROCEPHIN) 1 g in sodium chloride 0.9 % 100 mL IVPB     1 g 200 mL/hr over 30 Minutes Intravenous  Once 01/28/19 2154 01/29/19 0027        Subjective  Patient seen and examined at bedside no acute distress and resting comfortably.  No events overnight.  Tolerating diet. In good spirits and anticipating discharge. Daughter at bedside  Denies any chest pain, shortness of breath, fever, nausea, vomiting, urinary or bowel complaints. Otherwise ROS negative   Objective   Discharge Exam: Vitals:   01/30/19 0400 01/30/19 1129  BP: (!) 125/57 (!) 157/76  Pulse: 62 80  Resp: 14   Temp: 98.8 F (37.1 C)   SpO2: 98%    Vitals:   01/29/19 2053 01/30/19 0244 01/30/19 0400 01/30/19 1129  BP:   (!) 125/57 (!) 157/76  Pulse:   62 80  Resp:   14   Temp: 98.1 F (36.7 C)  98.8 F (37.1 C)   TempSrc: Oral  Oral   SpO2:   98%   Weight:  68.9 kg    Height:        Physical Exam Vitals and nursing  note reviewed.  Constitutional:      Appearance: Normal appearance.  HENT:     Head: Normocephalic and atraumatic.     Nose: Nose normal.     Mouth/Throat:     Mouth: Mucous membranes are moist.  Eyes:     Extraocular Movements: Extraocular movements intact.  Cardiovascular:     Rate and Rhythm: Normal rate and regular rhythm.  Pulmonary:     Effort: Pulmonary effort is normal.     Breath sounds: Normal breath sounds.  Abdominal:     General: Abdomen is flat.     Palpations: Abdomen is soft.   Musculoskeletal:        General: No swelling. Normal range of motion.     Cervical back: Normal range of motion. No rigidity.  Neurological:     General: No focal deficit present.     Mental Status: She is alert. Mental status is at baseline.  Psychiatric:        Mood and Affect: Mood normal.        Behavior: Behavior normal.       The results of significant diagnostics from this hospitalization (including imaging, microbiology, ancillary and laboratory) are listed below for reference.     Microbiology: Recent Results (from the past 240 hour(s))  Respiratory Panel by RT PCR (Flu A&B, Covid) - Nasopharyngeal Swab     Status: None   Collection Time: 01/28/19  9:19 PM   Specimen: Nasopharyngeal Swab  Result Value Ref Range Status   SARS Coronavirus 2 by RT PCR NEGATIVE NEGATIVE Final    Comment: (NOTE) SARS-CoV-2 target nucleic acids are NOT DETECTED. The SARS-CoV-2 RNA is generally detectable in upper respiratoy specimens during the acute phase of infection. The lowest concentration of SARS-CoV-2 viral copies this assay can detect is 131 copies/mL. A negative result does not preclude SARS-Cov-2 infection and should not be used as the sole basis for treatment or other patient management decisions. A negative result may occur with  improper specimen collection/handling, submission of specimen other than nasopharyngeal swab, presence of viral mutation(s) within the areas targeted by this assay, and inadequate number of viral copies (<131 copies/mL). A negative result must be combined with clinical observations, patient history, and epidemiological information. The expected result is Negative. Fact Sheet for Patients:  PinkCheek.be Fact Sheet for Healthcare Providers:  GravelBags.it This test is not yet ap proved or cleared by the [place] FDA and  has been authorized for detection and/or diagnosis of SARS-CoV-2  by FDA under an Emergency Use Authorization (EUA). This EUA will remain  in effect (meaning this test can be used) for the duration of the COVID-19 declaration under Section 564(b)(1) of the Act, 21 U.S.C. section 360bbb-3(b)(1), unless the authorization is terminated or revoked sooner.    Influenza A by PCR NEGATIVE NEGATIVE Final   Influenza B by PCR NEGATIVE NEGATIVE Final    Comment: (NOTE) The Xpert Xpress SARS-CoV-2/FLU/RSV assay is intended as an aid in  the diagnosis of influenza from Nasopharyngeal swab specimens and  should not be used as a sole basis for treatment. Nasal washings and  aspirates are unacceptable for Xpert Xpress SARS-CoV-2/FLU/RSV  testing. Fact Sheet for Patients: PinkCheek.be Fact Sheet for Healthcare Providers: GravelBags.it This test is not yet approved or cleared by the [place] FDA and  has been authorized for detection and/or diagnosis of SARS-CoV-2 by  FDA under an Emergency Use Authorization (EUA). This EUA will remain  in effect (meaning this test can be  used) for the duration of the  Covid-19 declaration under Section 564(b)(1) of the Act, 21  U.S.C. section 360bbb-3(b)(1), unless the authorization is  terminated or revoked. Performed at [facility], [address] [place], [place] [address]   Urine culture     Status: Abnormal   Collection Time: 01/28/19  9:23 PM   Specimen: Urine, Random  Result Value Ref Range Status   Specimen Description   Final    URINE, RANDOM Performed at [facility], [address] [place], [place] [address]    Special Requests   Final    NONE Performed at [facility], [address] [place], [place] [address]    Culture MULTIPLE SPECIES PRESENT, SUGGEST RECOLLECTION (A)  Final   Report Status 01/30/2019 FINAL  Final     Labs: BNP (last 3 results) No results for input(s): BNP in the last 8760 hours. Basic Metabolic Panel: Recent Labs  Lab  01/28/19 1501 01/29/19 0530 01/29/19 1824 01/30/19 0337  NA 139 137  --  139  K 3.2* 3.6  --  3.5  CL 97* 102  --  106  CO2 29 26  --  22  GLUCOSE 156* 110*  --  100*  BUN 31* 25*  --  19  CREATININE 0.83 0.90 0.86 0.92  CALCIUM 9.7 8.9  --  8.7*  MG  --   --   --  1.8   Liver Function Tests: Recent Labs  Lab 01/29/19 0530  AST 59*  ALT 24  ALKPHOS 47  BILITOT 0.5  PROT 6.1*  ALBUMIN 3.2*   No results for input(s): LIPASE, AMYLASE in the last 168 hours. No results for input(s): AMMONIA in the last 168 hours. CBC: Recent Labs  Lab 01/28/19 1501 01/29/19 1824 01/30/19 0337  WBC 15.7* 12.7* 10.7*  HGB 15.3* 15.2* 13.7  HCT 47.9* 45.4 41.9  MCV 93.7 89.7 92.7  PLT 289 234 227   Cardiac Enzymes: No results for input(s): CKTOTAL, CKMB, CKMBINDEX, TROPONINI in the last 168 hours. BNP: Invalid input(s): POCBNP CBG: No results for input(s): GLUCAP in the last 168 hours. D-Dimer No results for input(s): DDIMER in the last 72 hours. Hgb A1c Recent Labs    01/28/19 2145  HGBA1C 5.8*   Lipid Profile Recent Labs    01/28/19 2145  CHOL 158  HDL 57  LDLCALC 80  TRIG 104  CHOLHDL 2.8   Thyroid function studies No results for input(s): TSH, T4TOTAL, T3FREE, THYROIDAB in the last 72 hours.  Invalid input(s): FREET3 Anemia work up No results for input(s): VITAMINB12, FOLATE, FERRITIN, TIBC, IRON, RETICCTPCT in the last 72 hours. Urinalysis    Component Value Date/Time   COLORURINE YELLOW 01/28/2019 2123   APPEARANCEUR HAZY (A) 01/28/2019 2123   LABSPEC 1.024 01/28/2019 2123   PHURINE 5.0 01/28/2019 2123   GLUCOSEU NEGATIVE 01/28/2019 2123   HGBUR SMALL (A) 01/28/2019 2123   BILIRUBINUR NEGATIVE 01/28/2019 2123   KETONESUR NEGATIVE 01/28/2019 2123   PROTEINUR NEGATIVE 01/28/2019 2123   NITRITE NEGATIVE 01/28/2019 2123   LEUKOCYTESUR LARGE (A) 01/28/2019 2123   Sepsis Labs Invalid input(s): PROCALCITONIN,  WBC,  LACTICIDVEN Microbiology Recent Results  (from the past 240 hour(s))  Respiratory Panel by RT PCR (Flu A&B, Covid) - Nasopharyngeal Swab     Status: None   Collection Time: 01/28/19  9:19 PM   Specimen: Nasopharyngeal Swab  Result Value Ref Range Status   SARS Coronavirus 2 by RT PCR NEGATIVE NEGATIVE Final    Comment: (  NOTE) SARS-CoV-2 target nucleic acids are NOT DETECTED. The SARS-CoV-2 RNA is generally detectable in upper respiratoy specimens during the acute phase of infection. The lowest concentration of SARS-CoV-2 viral copies this assay can detect is 131 copies/mL. A negative result does not preclude SARS-Cov-2 infection and should not be used as the sole basis for treatment or other patient management decisions. A negative result may occur with  improper specimen collection/handling, submission of specimen other than nasopharyngeal swab, presence of viral mutation(s) within the areas targeted by this assay, and inadequate number of viral copies (<131 copies/mL). A negative result must be combined with clinical observations, patient history, and epidemiological information. The expected result is Negative. Fact Sheet for Patients:  https://www.moore.com/ Fact Sheet for Healthcare Providers:  https://www.young.biz/ This test is not yet ap proved or cleared by the [place] FDA and  has been authorized for detection and/or diagnosis of SARS-CoV-2 by FDA under an Emergency Use Authorization (EUA). This EUA will remain  in effect (meaning this test can be used) for the duration of the COVID-19 declaration under Section 564(b)(1) of the Act, 21 U.S.C. section 360bbb-3(b)(1), unless the authorization is terminated or revoked sooner.    Influenza A by PCR NEGATIVE NEGATIVE Final   Influenza B by PCR NEGATIVE NEGATIVE Final    Comment: (NOTE) The Xpert Xpress SARS-CoV-2/FLU/RSV assay is intended as an aid in  the diagnosis of influenza from Nasopharyngeal swab specimens and   should not be used as a sole basis for treatment. Nasal washings and  aspirates are unacceptable for Xpert Xpress SARS-CoV-2/FLU/RSV  testing. Fact Sheet for Patients: https://www.moore.com/ Fact Sheet for Healthcare Providers: https://www.young.biz/ This test is not yet approved or cleared by the [place] FDA and  has been authorized for detection and/or diagnosis of SARS-CoV-2 by  FDA under an Emergency Use Authorization (EUA). This EUA will remain  in effect (meaning this test can be used) for the duration of the  Covid-19 declaration under Section 564(b)(1) of the Act, 21  U.S.C. section 360bbb-3(b)(1), unless the authorization is  terminated or revoked. Performed at [facility], [address] [place], [place] [address]   Urine culture     Status: Abnormal   Collection Time: 01/28/19  9:23 PM   Specimen: Urine, Random  Result Value Ref Range Status   Specimen Description   Final    URINE, RANDOM Performed at [facility], [address], [place], [place] [address]    Special Requests   Final    NONE Performed at [facility], [address] [place], [place] [address]    Culture MULTIPLE SPECIES PRESENT, SUGGEST RECOLLECTION (A)  Final   Report Status 01/30/2019 FINAL  Final    Discharge Instructions     Discharge Instructions    Amb Referral to Cardiac Rehabilitation   Complete by: As directed    Diagnosis:  NSTEMI Coronary Stents PTCA     After initial evaluation and assessments completed: Virtual Based Care may be provided alone or in conjunction with Phase 2 Cardiac Rehab based on patient barriers.: Yes   Diet - low sodium heart healthy   Complete by: As directed    Discharge instructions   Complete by: As directed    You were seen and examined in the hospital for a heart attack and cared for by a hospitalist.   Upon Discharge:  - Stop taking Hydrochlorothiazide and simvastatin - Start taking metoprolol 25 mg  daily - Start taking Ticagrelor (Brilinta) twice daily - Take Asprin  81 mg daily - Start taking Atorvastatin daily and get repeat cholesterol panel in 6 weeks - Follow up will be scheduled with your cardiologist soon  Request that your primary physician go over all hospital tests and procedures/radiological results at the follow up.   Please get all hospital records sent to your physician by signing a hospital release before you go home.     Read the complete instructions along with all the possible side effects for all the medicines you take and that have been prescribed to you. Take any new medicines after you have completely understood and accept all the possible adverse reactions/side effects.   If you have any questions about your discharge medications or the care you received while you were in the hospital, you can call the unit and asked to speak with the hospitalist on call. Once you are discharged, your primary care physician will handle any further medical issues. Please note that NO REFILLS for any discharge medications will be authorized, as it is imperative that you return to your primary care physician (or establish a relationship with a primary care physician if you do not have one) for your aftercare needs so that they can reassess your need for medications and monitor your lab values.   Do not drive, operate heavy machinery, perform activities at heights, swimming or participation in water activities or provide baby sitting services if your were admitted for loss of consciousness/seizures or if you are on sedating medications including, but not limited to benzodiazepines, sleep medications, narcotic pain medications, etc., until you have been cleared to do so by a medical doctor.   Do not take more than prescribed medications.   Wear a seat belt while driving.  If you have smoked or chewed Tobacco in the last 2 years please stop smoking; also stop any regular Alcohol and/or  any Recreational drug use including marijuana.  If you experience worsening of your admission symptoms or develop shortness of breath, chest pain, suicidal or homicidal thoughts or experience a life threatening emergency, you must seek medical attention immediately by calling 911 or calling your PCP immediately.   Increase activity slowly   Complete by: As directed      Allergies as of 01/30/2019   No Known Allergies     Medication List    STOP taking these medications   hydrochlorothiazide 25 MG tablet Commonly known as: HYDRODIURIL   meloxicam 15 MG tablet Commonly known as: MOBIC   simvastatin 40 MG tablet Commonly known as: ZOCOR     TAKE these medications   aspirin EC 81 MG tablet Take 81 mg by mouth daily. What changed: Another medication with the same name was removed. Continue taking this medication, and follow the directions you see here.   atorvastatin 80 MG tablet Commonly known as: LIPITOR Take 1 tablet (80 mg total) by mouth daily at 6 PM.   metoprolol succinate 25 MG 24 hr tablet Commonly known as: TOPROL-XL Take 0.5 tablets (12.5 mg total) by mouth daily. Start taking on: January 31, 2019   nitroGLYCERIN 0.4 MG SL tablet Commonly known as: NITROSTAT Place 1 tablet (0.4 mg total) under the tongue every 5 (five) minutes x 3 doses as needed for chest pain.   ticagrelor 90 MG Tabs tablet Commonly known as: BRILINTA Take 1 tablet (90 mg total) by mouth 2 (two) times daily.      Follow-up Information    Azalee CourseMeng, [place], [place] Follow up on 02/06/2019.   Specialties: Cardiology, Radiology  Why: 9:30 am TOC Contact information: [address] [place] [address] [phone]          No Known Allergies  Time coordinating discharge: Over 30 minutes   SIGNED:   Jae Dire, D.O. [facility] Pager: [phone]  01/30/2019, 11:40 AM

## 2019-01-30 NOTE — Progress Notes (Signed)
CARDIAC REHAB PHASE I   PRE:  Rate/Rhythm: 80 SR    BP: sitting 138/67    SaO2: 97 RA  MODE:  Ambulation: 260 ft   POST:  Rate/Rhythm: 106 ST    BP: sitting 154/76     SaO2: 97 RA  Pt thankful to get up and walk. Somewhat weak initially, better with distance. She has a bad knee that she was taking meloxicam daily for. Got injection last week and "its doing good". To recliner. Discussed MI, stent, Brilinta, restrictions, diet, exercise, NTG, and CRPII. She is very receptive. Understands the importance of Brilinta and discussed the risk of bleeding with meloxicam and encouraged tylenol. Will refer to [facility] CRPII however she is not interested. Would prefer to exercise (walk) at home. Her family lives nearby.  [phone]   Harriet Masson CES, ACSM 01/30/2019 9:15 AM

## 2019-01-30 NOTE — Progress Notes (Addendum)
Progress Note  Patient Name: Theresa Norman Date of Encounter: 01/30/2019  Primary Cardiologist: Sanda Klein, MD   Subjective   No chest pain.  Inpatient Medications    Scheduled Meds: . aspirin EC  81 mg Oral Daily  . atorvastatin  80 mg Oral q1800  . enoxaparin (LOVENOX) injection  40 mg Subcutaneous Q24H  . metoprolol succinate  12.5 mg Oral Daily  . sodium chloride flush  3 mL Intravenous Once  . sodium chloride flush  3 mL Intravenous Q12H  . sodium chloride flush  3 mL Intravenous Q12H  . ticagrelor  90 mg Oral BID   Continuous Infusions: . sodium chloride    . sodium chloride     PRN Meds: sodium chloride, acetaminophen, nitroGLYCERIN, ondansetron (ZOFRAN) IV, sodium chloride flush   Vital Signs    Vitals:   01/29/19 2040 01/29/19 2053 01/30/19 0244 01/30/19 0400  BP: (!) 120/59   (!) 125/57  Pulse: 81   62  Resp: 18   14  Temp:  98.1 F (36.7 C)  98.8 F (37.1 C)  TempSrc:  Oral  Oral  SpO2: 95%   98%  Weight:   68.9 kg   Height:        Intake/Output Summary (Last 24 hours) at 01/30/2019 1006 Last data filed at 01/30/2019 0839 Gross per 24 hour  Intake 300 ml  Output 600 ml  Net -300 ml   Last 3 Weights 01/30/2019 01/28/2019  Weight (lbs) 151 lb 14.4 oz 150 lb  Weight (kg) 68.9 kg 68.04 kg      Telemetry    Sinus rhythm 60-80s - Personally Reviewed  ECG    NSR 64, left axis deviation - Personally Reviewed  Physical Exam   GEN: No acute distress.   Neck: No JVD Cardiac: RRR, no murmurs, rubs, or gallops.  Respiratory: Clear to auscultation bilaterally. GI: Soft, nontender, non-distended  MS: No edema; No deformity. Neuro:  Nonfocal  Psych: Normal affect  Cath site C/D/I  Labs    High Sensitivity Troponin:   Recent Labs  Lab 01/28/19 2000 01/28/19 2145  TROPONINIHS 15,182* 14,880*      Chemistry Recent Labs  Lab 01/28/19 1501 01/28/19 1501 01/29/19 0530 01/29/19 1824 01/30/19 0337  NA 139  --  137  --  139  K  3.2*  --  3.6  --  3.5  CL 97*  --  102  --  106  CO2 29  --  26  --  22  GLUCOSE 156*  --  110*  --  100*  BUN 31*  --  25*  --  19  CREATININE 0.83   < > 0.90 0.86 0.92  CALCIUM 9.7  --  8.9  --  8.7*  PROT  --   --  6.1*  --   --   ALBUMIN  --   --  3.2*  --   --   AST  --   --  59*  --   --   ALT  --   --  24  --   --   ALKPHOS  --   --  47  --   --   BILITOT  --   --  0.5  --   --   GFRNONAA >60   < > >60 >60 59*  GFRAA >60   < > >60 >60 >60  ANIONGAP 13  --  9  --  11   < > =  values in this interval not displayed.     Hematology Recent Labs  Lab 01/28/19 1501 01/29/19 1824 01/30/19 0337  WBC 15.7* 12.7* 10.7*  RBC 5.11 5.06 4.52  HGB 15.3* 15.2* 13.7  HCT 47.9* 45.4 41.9  MCV 93.7 89.7 92.7  MCH 29.9 30.0 30.3  MCHC 31.9 33.5 32.7  RDW 12.8 13.2 13.3  PLT 289 234 227    BNPNo results for input(s): BNP, PROBNP in the last 168 hours.   DDimer No results for input(s): DDIMER in the last 168 hours.   Radiology    CARDIAC CATHETERIZATION  Result Date: 01/30/2019  The left ventricular systolic function is normal.  LV end diastolic pressure is normal.  The left ventricular ejection fraction is 55-65% by visual estimate.  3rd RPL lesion is 90% stenosed.  Prox LAD lesion is 70% stenosed.  Post intervention, there is a 0% residual stenosis.  A drug-eluting stent was successfully placed using a STENT RESOLUTE ONYX 2.5X15.  1.  Significant one-vessel coronary artery disease.  There is 70% stenosis in the proximal LAD which was significant by DFR at 0.88.  There is also significant stenosis in a small RPL3 supplying a small territory and thus this is unlikely to be the culprit. 2.  Normal LV systolic function and left ventricular end-diastolic pressure. 3.  Successful angioplasty and drug-eluting stent placement to the proximal LAD. Recommendations: Dual antiplatelet therapy for at least 12 months.  Aggressive treatment of risk factors. Likely discharge home tomorrow.    DG Chest Portable 1 View  Result Date: 01/28/2019 CLINICAL DATA:  Near syncope EXAM: PORTABLE CHEST 1 VIEW COMPARISON:  None. FINDINGS: Colonic inter positioning beneath the right diaphragm. Mild atelectasis at the right base. No consolidation or effusion. Heart size upper limits of normal. Aortic atherosclerosis. No pneumothorax. IMPRESSION: No active disease.  Minimal basilar atelectasis on the right Electronically Signed   By: Jasmine PangKim  Fujinaga M.D.   On: 01/28/2019 19:12   ECHOCARDIOGRAM COMPLETE  Result Date: 01/29/2019   ECHOCARDIOGRAM REPORT   Patient Name:   Theresa [name] Date of Exam: 01/29/2019 Medical Rec #:  [id number]       Height:       61.0 in Accession #:    [id number]      Weight:       150.0 lb Date of Birth:  [birth year]       BSA:          1.67 m Patient Age:    79 years        BP:           127/67 mmHg Patient Gender: F               HR:           66 bpm. Exam Location:  Inpatient Procedure: 2D Echo Indications:    Elevated Troponin                 Syncope 780.2 / R55  History:        Patient has no prior history of Echocardiogram examinations.                 NSTEMI; Risk Factors:Hypertension and Dyslipidemia.  Sonographer:    Leta Junglingiffany Cooper RDCS Referring Phys: [id number] TIMOTHY S OPYD IMPRESSIONS  1. Left ventricular ejection fraction, by visual estimation, is 55 to 60%. The left ventricle has normal function. There is moderately increased left ventricular hypertrophy.  2. The left ventricle has no regional wall  motion abnormalities.  3. Global right ventricle has normal systolic function.The right ventricular size is normal. No increase in right ventricular wall thickness.  4. Left atrial size was normal.  5. Right atrial size was normal.  6. The mitral valve is normal in structure. No evidence of mitral valve regurgitation. No evidence of mitral stenosis.  7. The tricuspid valve is normal in structure.  8. The tricuspid valve is normal in structure. Tricuspid valve regurgitation is not  demonstrated.  9. The aortic valve is normal in structure. Aortic valve regurgitation is not visualized. Mild aortic valve sclerosis without stenosis. 10. Pulmonic regurgitation is mild. 11. The pulmonic valve was normal in structure. Pulmonic valve regurgitation is mild. 12. The inferior vena cava is normal in size with greater than 50% respiratory variability, suggesting right atrial pressure of 3 mmHg. 13. The average left ventricular global longitudinal strain is -9.7 %. FINDINGS  Left Ventricle: Left ventricular ejection fraction, by visual estimation, is 55 to 60%. The left ventricle has normal function. The average left ventricular global longitudinal strain is -9.7 %. The left ventricle has no regional wall motion abnormalities. There is moderately increased left ventricular hypertrophy. Septal left ventricular hypertrophy. Normal left atrial pressure. Right Ventricle: The right ventricular size is normal. No increase in right ventricular wall thickness. Global RV systolic function is has normal systolic function. Left Atrium: Left atrial size was normal in size. Right Atrium: Right atrial size was normal in size Pericardium: There is no evidence of pericardial effusion. Mitral Valve: The mitral valve is normal in structure. No evidence of mitral valve regurgitation. No evidence of mitral valve stenosis by observation. Tricuspid Valve: The tricuspid valve is normal in structure. Tricuspid valve regurgitation is not demonstrated. Aortic Valve: The aortic valve is normal in structure. Aortic valve regurgitation is not visualized. Mild aortic valve sclerosis is present, with no evidence of aortic valve stenosis. Pulmonic Valve: The pulmonic valve was normal in structure. Pulmonic valve regurgitation is mild. Pulmonic regurgitation is mild. Aorta: The aortic root, ascending aorta and aortic arch are all structurally normal, with no evidence of dilitation or obstruction. Venous: The inferior vena cava is normal  in size with greater than 50% respiratory variability, suggesting right atrial pressure of 3 mmHg. IAS/Shunts: No atrial level shunt detected by color flow Doppler. There is no evidence of a patent foramen ovale. No ventricular septal defect is seen or detected. There is no evidence of an atrial septal defect.  LEFT VENTRICLE PLAX 2D LVIDd:         4.00 cm       Diastology LVIDs:         2.70 cm       LV e' lateral:   6.57 cm/s LV PW:         0.90 cm       LV E/e' lateral: 9.1 LV IVS:        1.50 cm       LV e' medial:    3.41 cm/s LVOT diam:     1.90 cm       LV E/e' medial:  17.6 LV SV:         43 ml LV SV Index:   24.80         2D Longitudinal Strain LVOT Area:     2.84 cm      2D Strain GLS Avg:     -9.7 %  LV Volumes (MOD) LV area d, A2C:    26.70 cm  LV area d, A4C:    28.60 cm LV area s, A2C:    16.90 cm LV area s, A4C:    17.10 cm LV major d, A2C:   7.19 cm LV major d, A4C:   7.06 cm LV major s, A2C:   6.46 cm LV major s, A4C:   6.43 cm LV vol d, MOD A2C: 79.8 ml LV vol d, MOD A4C: 94.1 ml LV vol s, MOD A2C: 37.2 ml LV vol s, MOD A4C: 38.2 ml LV SV MOD A2C:     42.6 ml LV SV MOD A4C:     94.1 ml LV SV MOD BP:      49.7 ml RIGHT VENTRICLE RV S prime:     16.90 cm/s TAPSE (M-mode): 1.8 cm LEFT ATRIUM             Index       RIGHT ATRIUM           Index LA diam:        3.50 cm 2.09 cm/m  RA Area:     10.90 cm LA Vol (A2C):   44.7 ml 26.74 ml/m RA Volume:   20.50 ml  12.27 ml/m LA Vol (A4C):   49.7 ml 29.74 ml/m LA Biplane Vol: 48.5 ml 29.02 ml/m  AORTIC VALVE LVOT Vmax:   83.60 cm/s LVOT Vmean:  59.100 cm/s LVOT VTI:    0.185 m  AORTA Ao Root diam: 2.60 cm Ao Asc diam:  3.60 cm MITRAL VALVE MV Area (PHT): 3.77 cm             SHUNTS MV PHT:        58.29 msec           Systemic VTI:  0.18 m MV Decel Time: 201 msec             Systemic Diam: 1.90 cm MV E velocity: 60.00 cm/s 103 cm/s MV A velocity: 45.90 cm/s 70.3 cm/s MV E/A ratio:  1.31       1.5  Donato Schultz MD Electronically signed by Donato Schultz MD  Signature Date/Time: 01/29/2019/2:06:38 PM    Final     Cardiac Studies   Left heart cath 01/29/19:  The left ventricular systolic function is normal.  LV end diastolic pressure is normal.  The left ventricular ejection fraction is 55-65% by visual estimate.  3rd RPL lesion is 90% stenosed.  Prox LAD lesion is 70% stenosed.  Post intervention, there is a 0% residual stenosis.  A drug-eluting stent was successfully placed using a STENT RESOLUTE ONYX 2.5X15.   1.  Significant one-vessel coronary artery disease.  There is 70% stenosis in the proximal LAD which was significant by DFR at 0.88.  There is also significant stenosis in a small RPL3 supplying a small territory and thus this is unlikely to be the culprit. 2.  Normal LV systolic function and left ventricular end-diastolic pressure. 3.  Successful angioplasty and drug-eluting stent placement to the proximal LAD.   Recommendations: Dual antiplatelet therapy for at least 12 months.  Aggressive treatment of risk factors. Likely discharge home tomorrow. _____________   Echo 01/29/19:  1. Left ventricular ejection fraction, by visual estimation, is 55 to 60%. The left ventricle has normal function. There is moderately increased left ventricular hypertrophy.  2. The left ventricle has no regional wall motion abnormalities.  3. Global right ventricle has normal systolic function.The right ventricular size is normal. No increase in right ventricular wall thickness.  4.  Left atrial size was normal.  5. Right atrial size was normal.  6. The mitral valve is normal in structure. No evidence of mitral valve regurgitation. No evidence of mitral stenosis.  7. The tricuspid valve is normal in structure.  8. The tricuspid valve is normal in structure. Tricuspid valve regurgitation is not demonstrated.  9. The aortic valve is normal in structure. Aortic valve regurgitation is not visualized. Mild aortic valve sclerosis without stenosis. 10.  Pulmonic regurgitation is mild. 11. The pulmonic valve was normal in structure. Pulmonic valve regurgitation is mild. 12. The inferior vena cava is normal in size with greater than 50% respiratory variability, suggesting right atrial pressure of 3 mmHg. 13. The average left ventricular global longitudinal strain is -9.7 %.   Patient Profile     79 y.o. female with a hx of HTN and HLP who is being followed for non-STEMI.  Assessment & Plan    NSTEMI CAD s/p DES to proximal LAD - hs troponin 20355 --> 14880 - left heart cath yesterday with 70% lesion in the proximal LAD (DFR 0.88) treated with DES - residual disease of RPL3 of 90% but felt unlikely to be the culprit lesion and therefore no intervention - she was started on ASA and brilinta - low dose toprol started - 80 mg atorvastatin    Hyperlipidemia with LDL goal < 70 - 01/28/2019: Cholesterol 158; HDL 57; LDL Cholesterol 80; Triglycerides 104; VLDL 21 - zocor was changed to 80 mg lipitor - repeat fasting lipids in 6 weeks - very close to target LDL of < 70   Hypertension  Medications as above. Will hold home HCTZ for now with addition of BB. May benefit from ACEI/ARB if better BP control is needed.  Bring BP log to follow up app after Dr. Royann Shivers sees.   Cardiology follow up has been arranged.  OK for discharge from a cardiology standpoint.   [facility] will sign off.   Medication Recommendations:  As above Other recommendations (labs, testing, etc):  Follow up has been made   For questions or updates, please contact [facility] Please consult [email or web address] for contact info under        Signed, Marcelino Duster, PA  01/30/2019, 10:06 AM    I have seen and examined the patient along with Marcelino Duster, PA , PA NP.  I have reviewed the chart, notes and new data.  I agree with PA/NP's note.  Key new complaints: asymptomatic Key examination changes: healthy radial artery access site with flat  ecchymosis, no hematoma, normal CV exam Key new findings / data: angiograms reviewed. The occluded PL branch was the culprit in my opinion (fits the wall motion abnormality - basal inferolateral hypokinesis) that I see on her echo, but the proximal LAD stenosis was the more menacing lesion. Preserved EF, no arrhythmia on telemetry.  PLAN: Mandatory dual antiplatelet therapy for 12 months. Lipid lowering w high dose statin,  target LDL<70. Discussed exercise/rehab. Low dose beta blocker (HR relatively slow). Early f/u.  Thurmon Fair, MD, [facility] [phone] 01/30/2019, 10:37 AM

## 2019-01-30 NOTE — Care Management (Signed)
Per Purvis Sheffield. W/[employer]- pay for  Brilinta 90 mg twice a day $83.38. Generic (Ticagrelor) not covered.  No PA required Tier 3 medication Deductible not met  Pharmacy:  [facility] ([employer] for 90 day supply $250.14.  REF# [id number]

## 2019-01-30 NOTE — Discharge Instructions (Signed)
Radial Site Care  This sheet gives you information about how to care for yourself after your procedure. Your health care provider may also give you more specific instructions. If you have problems or questions, contact your health care provider. What can I expect after the procedure? After the procedure, it is common to have:  Bruising and tenderness at the catheter insertion area. Follow these instructions at home: Medicines  Take over-the-counter and prescription medicines only as told by your health care provider. Insertion site care  Follow instructions from your health care provider about how to take care of your insertion site. Make sure you: ? Wash your hands with soap and water before you change your bandage (dressing). If soap and water are not available, use hand sanitizer. ? Change your dressing as told by your health care provider. ? Leave stitches (sutures), skin glue, or adhesive strips in place. These skin closures may need to stay in place for 2 weeks or longer. If adhesive strip edges start to loosen and curl up, you may trim the loose edges. Do not remove adhesive strips completely unless your health care provider tells you to do that.  Check your insertion site every day for signs of infection. Check for: ? Redness, swelling, or pain. ? Fluid or blood. ? Pus or a bad smell. ? Warmth.  Do not take baths, swim, or use a hot tub until your health care provider approves.  You may shower 24-48 hours after the procedure, or as directed by your health care provider. ? Remove the dressing and gently wash the site with plain soap and water. ? Pat the area dry with a clean towel. ? Do not rub the site. That could cause bleeding.  Do not apply powder or lotion to the site. Activity   For 24 hours after the procedure, or as directed by your health care provider: ? Do not flex or bend the affected arm. ? Do not push or pull heavy objects with the affected arm. ? Do not  drive yourself home from the hospital or clinic. You may drive 24 hours after the procedure unless your health care provider tells you not to. ? Do not operate machinery or power tools.  Do not lift anything that is heavier than 10 lb (4.5 kg), or the limit that you are told, until your health care provider says that it is safe.  Ask your health care provider when it is okay to: ? Return to work or school. ? Resume usual physical activities or sports. ? Resume sexual activity. General instructions  If the catheter site starts to bleed, raise your arm and put firm pressure on the site. If the bleeding does not stop, get help right away. This is a medical emergency.  If you went home on the same day as your procedure, a responsible adult should be with you for the first 24 hours after you arrive home.  Keep all follow-up visits as told by your health care provider. This is important. Contact a health care provider if:  You have a fever.  You have redness, swelling, or yellow drainage around your insertion site. Get help right away if:  You have unusual pain at the radial site.  The catheter insertion area swells very fast.  The insertion area is bleeding, and the bleeding does not stop when you hold steady pressure on the area.  Your arm or hand becomes pale, cool, tingly, or numb. These symptoms may represent a serious problem   that is an emergency. Do not wait to see if the symptoms will go away. Get medical help right away. Call your local emergency services (911 in the [place] Do not drive yourself to the hospital. Summary  After the procedure, it is common to have bruising and tenderness at the site.  Follow instructions from your health care provider about how to take care of your radial site wound. Check the wound every day for signs of infection.  Do not lift anything that is heavier than 10 lb (4.5 kg), or the limit that you are told, until your health care provider says  that it is safe. This information is not intended to replace advice given to you by your health care provider. Make sure you discuss any questions you have with your health care provider. Document Revised: 01/31/2017 Document Reviewed: 01/31/2017 Elsevier Patient Education  2020 Elsevier Inc.    Heart-Healthy Eating Plan Heart-healthy meal planning includes:  Eating less unhealthy fats.  Eating more healthy fats.  Making other changes in your diet. Talk with your doctor or a diet specialist (dietitian) to create an eating plan that is right for you. What is my plan? Your doctor may recommend an eating plan that includes:  Total fat: ______% or less of total calories a day.  Saturated fat: ______% or less of total calories a day.  Cholesterol: less than _________mg a day. What are tips for following this plan? Cooking Avoid frying your food. Try to bake, boil, grill, or broil it instead. You can also reduce fat by:  Removing the skin from poultry.  Removing all visible fats from meats.  Steaming vegetables in water or broth. Meal planning   At meals, divide your plate into four equal parts: ? Fill one-half of your plate with vegetables and green salads. ? Fill one-fourth of your plate with whole grains. ? Fill one-fourth of your plate with lean protein foods.  Eat 4-5 servings of vegetables per day. A serving of vegetables is: ? 1 cup of raw or cooked vegetables. ? 2 cups of raw leafy greens.  Eat 4-5 servings of fruit per day. A serving of fruit is: ? 1 medium whole fruit. ?  cup of dried fruit. ?  cup of fresh, frozen, or canned fruit. ?  cup of 100% fruit juice.  Eat more foods that have soluble fiber. These are apples, broccoli, carrots, beans, peas, and barley. Try to get 20-30 g of fiber per day.  Eat 4-5 servings of nuts, legumes, and seeds per week: ? 1 serving of dried beans or legumes equals  cup after being cooked. ? 1 serving of nuts is   cup. ? 1 serving of seeds equals 1 tablespoon. General information  Eat more home-cooked food. Eat less restaurant, buffet, and fast food.  Limit or avoid alcohol.  Limit foods that are high in starch and sugar.  Avoid fried foods.  Lose weight if you are overweight.  Keep track of how much salt (sodium) you eat. This is important if you have high blood pressure. Ask your doctor to tell you more about this.  Try to add vegetarian meals each week. Fats  Choose healthy fats. These include olive oil and canola oil, flaxseeds, walnuts, almonds, and seeds.  Eat more omega-3 fats. These include salmon, mackerel, sardines, tuna, flaxseed oil, and ground flaxseeds. Try to eat fish at least 2 times each week.  Check food labels. Avoid foods with trans fats or high amounts of saturated fat.    Limit saturated fats. ? These are often found in animal products, such as meats, butter, and cream. ? These are also found in plant foods, such as palm oil, palm kernel oil, and coconut oil.  Avoid foods with partially hydrogenated oils in them. These have trans fats. Examples are stick margarine, some tub margarines, cookies, crackers, and other baked goods. What foods can I eat? Fruits All fresh, canned (in natural juice), or frozen fruits. Vegetables Fresh or frozen vegetables (raw, steamed, roasted, or grilled). Green salads. Grains Most grains. Choose whole wheat and whole grains most of the time. Rice and pasta, including brown rice and pastas made with whole wheat. Meats and other proteins Lean, well-trimmed beef, veal, pork, and lamb. Chicken and turkey without skin. All fish and shellfish. Wild duck, rabbit, pheasant, and venison. Egg whites or low-cholesterol egg substitutes. Dried beans, peas, lentils, and tofu. Seeds and most nuts. Dairy Low-fat or nonfat cheeses, including ricotta and mozzarella. Skim or 1% milk that is liquid, powdered, or evaporated. Buttermilk that is made with  low-fat milk. Nonfat or low-fat yogurt. Fats and oils Non-hydrogenated (trans-free) margarines. Vegetable oils, including soybean, sesame, sunflower, olive, peanut, safflower, corn, canola, and cottonseed. Salad dressings or mayonnaise made with a vegetable oil. Beverages Mineral water. Coffee and tea. Diet carbonated beverages. Sweets and desserts Sherbet, gelatin, and fruit ice. Small amounts of dark chocolate. Limit all sweets and desserts. Seasonings and condiments All seasonings and condiments. The items listed above may not be a complete list of foods and drinks you can eat. Contact a dietitian for more options. What foods should I avoid? Fruits Canned fruit in heavy syrup. Fruit in cream or butter sauce. Fried fruit. Limit coconut. Vegetables Vegetables cooked in cheese, cream, or butter sauce. Fried vegetables. Grains Breads that are made with saturated or trans fats, oils, or whole milk. Croissants. Sweet rolls. Donuts. High-fat crackers, such as cheese crackers. Meats and other proteins Fatty meats, such as hot dogs, ribs, sausage, bacon, rib-eye roast or steak. High-fat deli meats, such as salami and bologna. Caviar. Domestic duck and goose. Organ meats, such as liver. Dairy Cream, sour cream, cream cheese, and creamed cottage cheese. Whole-milk cheeses. Whole or 2% milk that is liquid, evaporated, or condensed. Whole buttermilk. Cream sauce or high-fat cheese sauce. Yogurt that is made from whole milk. Fats and oils Meat fat, or shortening. Cocoa butter, hydrogenated oils, palm oil, coconut oil, palm kernel oil. Solid fats and shortenings, including bacon fat, salt pork, lard, and butter. Nondairy cream substitutes. Salad dressings with cheese or sour cream. Beverages Regular sodas and juice drinks with added sugar. Sweets and desserts Frosting. Pudding. Cookies. Cakes. Pies. Milk chocolate or white chocolate. Buttered syrups. Full-fat ice cream or ice cream drinks. The items  listed above may not be a complete list of foods and drinks to avoid. Contact a dietitian for more information. Summary  Heart-healthy meal planning includes eating less unhealthy fats, eating more healthy fats, and making other changes in your diet.  Eat a balanced diet. This includes fruits and vegetables, low-fat or nonfat dairy, lean protein, nuts and legumes, whole grains, and heart-healthy oils and fats. This information is not intended to replace advice given to you by your health care provider. Make sure you discuss any questions you have with your health care provider. Document Revised: 03/01/2017 Document Reviewed: 02/02/2017 Elsevier Patient Education  2020 Elsevier Inc.  

## 2019-02-04 DIAGNOSIS — E559 Vitamin D deficiency, unspecified: Secondary | ICD-10-CM | POA: Diagnosis not present

## 2019-02-04 DIAGNOSIS — R7301 Impaired fasting glucose: Secondary | ICD-10-CM | POA: Diagnosis not present

## 2019-02-04 DIAGNOSIS — I10 Essential (primary) hypertension: Secondary | ICD-10-CM | POA: Diagnosis not present

## 2019-02-04 DIAGNOSIS — N182 Chronic kidney disease, stage 2 (mild): Secondary | ICD-10-CM | POA: Diagnosis not present

## 2019-02-04 DIAGNOSIS — Z0189 Encounter for other specified special examinations: Secondary | ICD-10-CM | POA: Diagnosis not present

## 2019-02-06 ENCOUNTER — Other Ambulatory Visit: Payer: Self-pay

## 2019-02-06 ENCOUNTER — Encounter: Payer: Self-pay | Admitting: Physician Assistant

## 2019-02-06 ENCOUNTER — Ambulatory Visit: Payer: Medicare Other | Admitting: Family Medicine

## 2019-02-06 ENCOUNTER — Telehealth: Payer: Self-pay

## 2019-02-06 VITALS — BP 162/87 | HR 86 | Temp 98.4°F | Ht 61.0 in | Wt 150.8 lb

## 2019-02-06 DIAGNOSIS — I10 Essential (primary) hypertension: Secondary | ICD-10-CM

## 2019-02-06 DIAGNOSIS — E782 Mixed hyperlipidemia: Secondary | ICD-10-CM | POA: Diagnosis not present

## 2019-02-06 DIAGNOSIS — I214 Non-ST elevation (NSTEMI) myocardial infarction: Secondary | ICD-10-CM

## 2019-02-06 MED ORDER — METOPROLOL SUCCINATE ER 25 MG PO TB24: 25.0000 mg | ORAL_TABLET | Freq: Every day | ORAL | 3 refills | Status: AC

## 2019-02-06 NOTE — Telephone Encounter (Signed)
    Dr. Diona Browner is out of the office until next Monday. Will leave for review upon his return.   Signed, Ellsworth Lennox, PA-C 02/06/2019, 10:30 AM Pager: [phone]

## 2019-02-06 NOTE — Progress Notes (Signed)
Cardiology Office Note  Date: 02/06/2019   ID: Theresa Norman, DOB 06-May-1940, MRN [id number]  PCP:  Benita Stabile, MD  Cardiologist:  Thurmon Fair, MD Electrophysiologist:  None   Chief Complaint  Patient presents with  . Follow-up    NSTEMI    History of Present Illness: Theresa Norman is a 79 y.o. female who presented to the emergency room on January 19 with sudden onset of dizziness, near syncope, vomiting x2.  She described a pair of pressure in her upper chest that resolved after vomiting.  She also complained of having an episode of feeling like her heart was racing previous in the evening which resolved.  She denied any chest pain or shortness of breath/abdominal pain on arrival to the emergency room.  An initial troponin was drawn which was 15,182.  She also had a UTI.  Additional past medical history includes hypertension, hyperlipidemia.  Cardiology was consulted for NSTEMI.  Second troponin 14,880.  She was scheduled for cardiac cath placed on IV heparin.  Patient went to Cath Lab on January 29, 2019.  Proximal LAD was 70% stenosed and had a DES placed.  Third RPL lesion 90% stenosed.  There was no intervention on this vessel.  Patient was started on Brilinta and aspirin, atorvastatin 80 mg, Toprol-XL was started 12.5 mg/day.  Patient denies any problems since being discharged from the hospital.  Denies any progressive anginal or exertional symptoms, nausea, vomiting, diaphoresis, palpitations or arrhythmias, orthostatic symptoms, dyspeptic symptoms.  Denies any lower extremity edema.  Right radial access site has some bruising and discoloration but patient states it is improving.  She has good right radial pulses.  Access site is clean and dry.  Patient would like to switch to a cardiologist in [place] if possible.  Advised patient of the 3 cardiologist to work in the reasonable office.  Patient would like to see Dr. Diona Browner in the future.  She states driving to  [place] is just too far to drive.  Medications reviewed with patient.  She is currently taking aspirin 81 mg, atorvastatin 80 mg, metoprolol 12.5 mg daily, sublingual nitroglycerin as needed, Brilinta 90 mg p.o. twice daily.  Patient states she is scheduled to see her primary care physician Dr. Margo Norman in 2 to 3 weeks for lab work.  Blood pressure elevated on arrival.  Patient states at home it is usually much better in the 130s to 140 systolic.  Past Medical History:  Diagnosis Date  . Anxiety   . Arthritis   . CAD (coronary artery disease), native coronary artery 01/29/2019   NSTEMI: DES-pLAD  . High cholesterol   . Hypercholesteremia   . Hypertension   . Immune deficiency disorder [facility])     Past Surgical History:  Procedure Laterality Date  . CATARACT EXTRACTION W/PHACO Right 11/07/2016   Procedure: CATARACT EXTRACTION PHACO AND INTRAOCULAR LENS PLACEMENT RIGHT EYE;  Surgeon: Gemma Payor, MD;  Location: [facility];  Service: Ophthalmology;  Laterality: Right;  CDE: 13.30  . CATARACT EXTRACTION W/PHACO Left 11/20/2016   Procedure: CATARACT EXTRACTION PHACO AND INTRAOCULAR LENS PLACEMENT (IOC);  Surgeon: Gemma Payor, MD;  Location: [facility];  Service: Ophthalmology;  Laterality: Left;  CDE: 20.00  . CORONARY STENT INTERVENTION N/A 01/29/2019   Procedure: CORONARY STENT INTERVENTION;  Surgeon: Iran Ouch, MD;  Location: [facility];  Service: Cardiovascular;  Laterality: N/A;  . INTRAVASCULAR PRESSURE WIRE/FFR STUDY N/A 01/29/2019   Procedure: INTRAVASCULAR PRESSURE WIRE/FFR STUDY;  Surgeon: Kirke Corin,  Mertie Clause, MD;  Location: [place] CV LAB;  Service: Cardiovascular;  Laterality: N/A;  . LEFT HEART CATH AND CORONARY ANGIOGRAPHY N/A 01/29/2019   Procedure: LEFT HEART CATH AND CORONARY ANGIOGRAPHY;  Surgeon: Wellington Hampshire, MD;  Location: [place] CV LAB;  Service: Cardiovascular;  Laterality: N/A;    Current Outpatient Medications  Medication Sig Dispense Refill  .  aspirin EC 81 MG tablet Take 81 mg by mouth daily.    Marland Kitchen atorvastatin (LIPITOR) 80 MG tablet Take 1 tablet (80 mg total) by mouth daily at 6 PM. 60 tablet 0  . metoprolol succinate (TOPROL-XL) 25 MG 24 hr tablet Take 0.5 tablets (12.5 mg total) by mouth daily. 30 tablet 0  . nitroGLYCERIN (NITROSTAT) 0.4 MG SL tablet Place 1 tablet (0.4 mg total) under the tongue every 5 (five) minutes x 3 doses as needed for chest pain. 10 tablet 0  . simvastatin (ZOCOR) 20 MG tablet Take 20 mg by mouth daily.    . ticagrelor (BRILINTA) 90 MG TABS tablet Take 1 tablet (90 mg total) by mouth 2 (two) times daily. 60 tablet 1   No current facility-administered medications for this visit.   Allergies:  Patient has no known allergies.   Social History: The patient  reports that she has never smoked. She has never used smokeless tobacco. She reports that she does not drink alcohol or use drugs.   Family History: The patient's family history is not on file.   ROS:  Please see the history of present illness. Otherwise, complete review of systems is positive for none.  All other systems are reviewed and negative.   Physical Exam: VS:  BP (!) 162/87   Pulse 86   Temp 98.4 F (36.9 C)   Ht 5\' 1"  (1.549 m)   Wt 150 lb 12.8 oz (68.4 kg)   SpO2 96%   BMI 28.49 kg/m , BMI Body mass index is 28.49 kg/m.  Wt Readings from Last 3 Encounters:  02/06/19 150 lb 12.8 oz (68.4 kg)  01/30/19 151 lb 14.4 oz (68.9 kg)  11/07/16 162 lb (73.5 kg)    General: Patient appears comfortable at rest. HEENT: Conjunctiva and lids normal, oropharynx clear with moist mucosa. Neck: Supple, no elevated JVP or carotid bruits, no thyromegaly. Lungs: Clear to auscultation, nonlabored breathing at rest. Cardiac: Regular rate and rhythm, no S3 or significant systolic murmur, no pericardial rub. Abdomen: Soft, nontender, no hepatomegaly, bowel sounds present, no guarding or rebound. Extremities: No pitting edema, distal pulses 2+. Skin:  Warm and dry. Musculoskeletal: No kyphosis. Neuropsychiatric: Alert and oriented x3, affect grossly appropriate.  ECG:  An ECG dated 02/06/2019 was personally reviewed today and demonstrated:  Sinus rhythm rate of 83, left anterior fascicular block voltage criteria for left ventricular hypertrophy, no specific T wave abnormality, prolonged QT.  QT/QTc 402/472 ms  Recent Labwork: 01/29/2019: ALT 24; AST 59 01/30/2019: BUN 19; Creatinine, Ser 0.92; Hemoglobin 13.7; Magnesium 1.8; Platelets 227; Potassium 3.5; Sodium 139     Component Value Date/Time   CHOL 158 01/28/2019 2145   TRIG 104 01/28/2019 2145   HDL 57 01/28/2019 2145   CHOLHDL 2.8 01/28/2019 2145   VLDL 21 01/28/2019 2145   Britt 80 01/28/2019 2145    Other Studies Reviewed Today:  Cardiac catheterization January 29, 2019  The left ventricular systolic function is normal.  LV end diastolic pressure is normal.  The left ventricular ejection fraction is 55-65% by visual estimate.  3rd RPL lesion is  90% stenosed.  Prox LAD lesion is 70% stenosed.  Post intervention, there is a 0% residual stenosis.  A drug-eluting stent was successfully placed using a STENT RESOLUTE ONYX 2.5X15.   1.  Significant one-vessel coronary artery disease.  There is 70% stenosis in the proximal LAD which was significant by DFR at 0.88.  There is also significant stenosis in a small RPL3 supplying a small territory and thus this is unlikely to be the culprit. 2.  Normal LV systolic function and left ventricular end-diastolic pressure. 3.  Successful angioplasty and drug-eluting stent placement to the proximal LAD. Recommendations: Dual antiplatelet therapy for at least 12 months.  Aggressive treatment of risk factors.  Echocardiogram January 29, 2019 1. Left ventricular ejection fraction, by visual estimation, is 55 to 60%. The left ventricle has normal function. There is moderately increased left ventricular hypertrophy. 2. The left  ventricle has no regional wall motion abnormalities. 3. Global right ventricle has normal systolic function.The right ventricular size is normal. No increase in right ventricular wall thickness. 4. Left atrial size was normal. 5. Right atrial size was normal. 6. The mitral valve is normal in structure. No evidence of mitral valve regurgitation. No evidence of mitral stenosis. 7. The tricuspid valve is normal in structure. 8. The tricuspid valve is normal in structure. Tricuspid valve regurgitation is not demonstrated. 9. The aortic valve is normal in structure. Aortic valve regurgitation is not visualized. Mild aortic valve sclerosis without stenosis. 10. Pulmonic regurgitation is mild. 11. The pulmonic valve was normal in structure. Pulmonic valve regurgitation is mild. 12. The inferior vena cava is normal in size with greater than 50% respiratory variability, suggesting right atrial pressure of 3 mmHg. 13. The average left ventricular global longitudinal strain is -9.7 %.  Assessment and Plan:  1. NSTEMI (non-ST elevated myocardial infarction) (HCC)   2. Essential hypertension   3. Mixed hyperlipidemia    1. NSTEMI (non-ST elevated myocardial infarction) [facility]) Status post NSTEMI January 19 02/2019, cardiac catheterization on January.  Cardiac catheterization in January 20.  Mid LAD lesion stented.  RPL lesion 90% not treated.  Patient denies any subsequent anginal or exertional symptoms, or other associated symptoms.  Continue aspirin 81 mg, Brilinta 90 mg p.o. twice daily, nitroglycerin sublingual 0.4 mg as needed.  She is scheduled to have lab work with Dr. Margo Norman in 2 to 3 weeks.  Advised her to have a CBC, BMP with magnesium, lipid profile and LFTs.  Have Dr. Margo Norman following these labs to [place] please.  2. Essential hypertension Blood pressure elevated on arrival 162/87.  Patient states systolic blood pressures at home are usually 130s to 140s.  Increased dose of Toprol to 25 mg daily.   Monitor blood pressure at least 3-4 times a week.    3. Mixed hyperlipidemia Continue atorvastatin 80 mg p.o. twice daily.  Medication Adjustments/Labs and Tests Ordered: Current medicines are reviewed at length with the patient today.  Concerns regarding medicines are outlined above.    There are no Patient Instructions on file for this visit.       Signed, Rennis Harding, NP 02/06/2019 9:39 AM    [facility] at [facility] [address] [place], [place], [place] [address] Phone: [phone]; Fax: [phone]

## 2019-02-06 NOTE — Telephone Encounter (Signed)
No objections

## 2019-02-06 NOTE — Telephone Encounter (Signed)
Patient had an office visit today with Rennis Harding, NP and she mentioned to him that she would like to switch care to Dr. Diona Browner in [place] office for it is closer for her.

## 2019-02-06 NOTE — Patient Instructions (Signed)
Medication Instructions:   INCREASE Metoprolol Succinate (Toprol-XL) to 25 mg daily  *If you need a refill on your cardiac medications before your next appointment, please call your pharmacy*  Lab Work: You will need to have your labs (blood work) completed by your PCP as discussed in today's visit.  If you have labs (blood work) drawn today and your tests are completely normal, you will receive your results only by: Marland Kitchen MyChart Message (if you have MyChart) OR . A paper copy in the mail If you have any lab test that is abnormal or we need to change your treatment, we will call you to review the results.  Testing/Procedures: NONE ordered at this time of appointment   Follow-Up: At [facility], you and your health needs are our priority.  As part of our continuing mission to provide you with exceptional heart care, we have created designated Provider Care Teams.  These Care Teams include your primary Cardiologist (physician) and Advanced Practice Providers (APPs -  Physician Assistants and Nurse Practitioners) who all work together to provide you with the care you need, when you need it.  Your next appointment:   3 month(s)  The format for your next appointment:   In Person  Provider:   Nona Dell, MD  Other Instructions

## 2019-02-10 NOTE — Telephone Encounter (Signed)
That is fine 

## 2019-02-24 DIAGNOSIS — M1712 Unilateral primary osteoarthritis, left knee: Secondary | ICD-10-CM | POA: Diagnosis not present

## 2019-02-24 DIAGNOSIS — E782 Mixed hyperlipidemia: Secondary | ICD-10-CM | POA: Diagnosis not present

## 2019-02-24 DIAGNOSIS — R7301 Impaired fasting glucose: Secondary | ICD-10-CM | POA: Diagnosis not present

## 2019-02-24 DIAGNOSIS — I129 Hypertensive chronic kidney disease with stage 1 through stage 4 chronic kidney disease, or unspecified chronic kidney disease: Secondary | ICD-10-CM | POA: Diagnosis not present

## 2019-02-27 ENCOUNTER — Telehealth: Payer: Self-pay | Admitting: Cardiology

## 2019-02-27 MED ORDER — CLOPIDOGREL BISULFATE 75 MG PO TABS: ORAL_TABLET | ORAL | 3 refills | Status: DC

## 2019-02-27 NOTE — Telephone Encounter (Signed)
Spoke with Felicia @ [facility]. Informed her of medication change. She voiced understanding of plan. Sent RX.

## 2019-02-27 NOTE — Telephone Encounter (Signed)
Pt recently wanted to switch physicians. She wanted to see Dr. Diona Browner. She does not have an appointment until 4/30. Pt cannot afford Brilinta, and would like an alternative. Please advise. I will forward to [place], PA-C as she is covering for Dr. Diona Browner.

## 2019-02-27 NOTE — Telephone Encounter (Signed)
New Message    Pt c/o medication issue:  1. Name of Medication: ticagrelor (BRILINTA) 90 MG TABS tablet  2. How are you currently taking this medication (dosage and times per day)? No taking   3. Are you having a reaction (difficulty breathing--STAT)? No   4. What is your medication issue? Felecia from [facility] is calling and says the pt says this medication is too expensive ad is wondering if there is an alternative    Please call

## 2019-02-27 NOTE — Telephone Encounter (Signed)
     Can switch Brilinta to Plavix. Would need to take 300mg  the first day (4 of the 75mg  tablets) then 75mg  daily afterwards.   Signed, , PA-C 02/27/2019, 12:44 PM Pager: [phone]

## 2019-03-17 DIAGNOSIS — E782 Mixed hyperlipidemia: Secondary | ICD-10-CM | POA: Diagnosis not present

## 2019-03-17 DIAGNOSIS — N182 Chronic kidney disease, stage 2 (mild): Secondary | ICD-10-CM | POA: Diagnosis not present

## 2019-03-17 DIAGNOSIS — I2102 ST elevation (STEMI) myocardial infarction involving left anterior descending coronary artery: Secondary | ICD-10-CM | POA: Diagnosis not present

## 2019-03-17 DIAGNOSIS — M1712 Unilateral primary osteoarthritis, left knee: Secondary | ICD-10-CM | POA: Diagnosis not present

## 2019-03-17 DIAGNOSIS — I129 Hypertensive chronic kidney disease with stage 1 through stage 4 chronic kidney disease, or unspecified chronic kidney disease: Secondary | ICD-10-CM | POA: Diagnosis not present

## 2019-03-18 DIAGNOSIS — R7301 Impaired fasting glucose: Secondary | ICD-10-CM | POA: Diagnosis not present

## 2019-03-18 DIAGNOSIS — Z0000 Encounter for general adult medical examination without abnormal findings: Secondary | ICD-10-CM | POA: Diagnosis not present

## 2019-03-18 DIAGNOSIS — I2102 ST elevation (STEMI) myocardial infarction involving left anterior descending coronary artery: Secondary | ICD-10-CM | POA: Diagnosis not present

## 2019-03-18 DIAGNOSIS — R946 Abnormal results of thyroid function studies: Secondary | ICD-10-CM | POA: Diagnosis not present

## 2019-03-18 DIAGNOSIS — I129 Hypertensive chronic kidney disease with stage 1 through stage 4 chronic kidney disease, or unspecified chronic kidney disease: Secondary | ICD-10-CM | POA: Diagnosis not present

## 2019-03-18 DIAGNOSIS — N182 Chronic kidney disease, stage 2 (mild): Secondary | ICD-10-CM | POA: Diagnosis not present

## 2019-03-20 DIAGNOSIS — I10 Essential (primary) hypertension: Secondary | ICD-10-CM | POA: Diagnosis not present

## 2019-03-20 DIAGNOSIS — N182 Chronic kidney disease, stage 2 (mild): Secondary | ICD-10-CM | POA: Diagnosis not present

## 2019-03-20 DIAGNOSIS — Z0001 Encounter for general adult medical examination with abnormal findings: Secondary | ICD-10-CM | POA: Diagnosis not present

## 2019-03-20 DIAGNOSIS — R7301 Impaired fasting glucose: Secondary | ICD-10-CM | POA: Diagnosis not present

## 2019-03-20 DIAGNOSIS — E559 Vitamin D deficiency, unspecified: Secondary | ICD-10-CM | POA: Diagnosis not present

## 2019-03-21 ENCOUNTER — Telehealth: Payer: Self-pay

## 2019-03-21 NOTE — Telephone Encounter (Signed)
I forwarded this message to Dr.Hall's office

## 2019-03-21 NOTE — Telephone Encounter (Signed)
-----  Message from Satira Sark, MD sent at 03/21/2019  1:17 PM EST ----- Results reviewed.  I have not actually met Theresa Norman yet, I did review her records.  Please check with Dr. Juel Burrow office about how they plan to handle the abnormal LFTs.  These are significantly higher than the check in January.  I do note that she is now on high-dose Lipitor, this should at least be cut back or possibly even held with repeat LFTs obtained in 4 to 6 weeks (she was previously on Zocor).

## 2019-03-25 ENCOUNTER — Telehealth: Payer: Self-pay | Admitting: Cardiology

## 2019-03-25 NOTE — Telephone Encounter (Signed)
Please give Laquasha w/ Dr. Marcelo Baldy office a call @ [phone] concerning the pt's medication

## 2019-03-25 NOTE — Telephone Encounter (Signed)
Returned call. Dr. Margo Aye wanted to make sure pt should continue taking aspirin 81 mg along with Plavix 75 mg daily. I will forward to Dr. Diona Browner to advise, as he has agreed to be the pt's provider at patients request.

## 2019-03-25 NOTE — Telephone Encounter (Signed)
Dr. Scharlene Gloss office made aware.

## 2019-03-25 NOTE — Telephone Encounter (Signed)
Yes.  She had an NSTEMI and DES intervention of the LAD in January.  12 months of dual antiplatelet therapy is anticipated.

## 2019-04-03 DIAGNOSIS — R945 Abnormal results of liver function studies: Secondary | ICD-10-CM | POA: Diagnosis not present

## 2019-04-03 DIAGNOSIS — I2102 ST elevation (STEMI) myocardial infarction involving left anterior descending coronary artery: Secondary | ICD-10-CM | POA: Diagnosis not present

## 2019-04-03 DIAGNOSIS — E559 Vitamin D deficiency, unspecified: Secondary | ICD-10-CM | POA: Diagnosis not present

## 2019-04-03 DIAGNOSIS — E782 Mixed hyperlipidemia: Secondary | ICD-10-CM | POA: Diagnosis not present

## 2019-04-03 DIAGNOSIS — Z634 Disappearance and death of family member: Secondary | ICD-10-CM | POA: Diagnosis not present

## 2019-04-03 DIAGNOSIS — R600 Localized edema: Secondary | ICD-10-CM | POA: Diagnosis not present

## 2019-04-03 DIAGNOSIS — I10 Essential (primary) hypertension: Secondary | ICD-10-CM | POA: Diagnosis not present

## 2019-04-03 DIAGNOSIS — I2510 Atherosclerotic heart disease of native coronary artery without angina pectoris: Secondary | ICD-10-CM | POA: Diagnosis not present

## 2019-04-30 DIAGNOSIS — E782 Mixed hyperlipidemia: Secondary | ICD-10-CM | POA: Diagnosis not present

## 2019-04-30 DIAGNOSIS — E559 Vitamin D deficiency, unspecified: Secondary | ICD-10-CM | POA: Diagnosis not present

## 2019-05-08 DIAGNOSIS — E782 Mixed hyperlipidemia: Secondary | ICD-10-CM | POA: Diagnosis not present

## 2019-05-08 DIAGNOSIS — M25562 Pain in left knee: Secondary | ICD-10-CM | POA: Diagnosis not present

## 2019-05-08 DIAGNOSIS — I2510 Atherosclerotic heart disease of native coronary artery without angina pectoris: Secondary | ICD-10-CM | POA: Diagnosis not present

## 2019-05-08 DIAGNOSIS — R945 Abnormal results of liver function studies: Secondary | ICD-10-CM | POA: Diagnosis not present

## 2019-05-08 DIAGNOSIS — I10 Essential (primary) hypertension: Secondary | ICD-10-CM | POA: Diagnosis not present

## 2019-05-09 ENCOUNTER — Ambulatory Visit (INDEPENDENT_AMBULATORY_CARE_PROVIDER_SITE_OTHER): Payer: Medicare Other | Admitting: Cardiology

## 2019-05-09 ENCOUNTER — Other Ambulatory Visit: Payer: Self-pay

## 2019-05-09 ENCOUNTER — Encounter: Payer: Self-pay | Admitting: Cardiology

## 2019-05-09 VITALS — BP 138/80 | HR 70 | Temp 98.7°F | Ht 61.0 in | Wt 140.1 lb

## 2019-05-09 DIAGNOSIS — E782 Mixed hyperlipidemia: Secondary | ICD-10-CM

## 2019-05-09 DIAGNOSIS — R945 Abnormal results of liver function studies: Secondary | ICD-10-CM | POA: Diagnosis not present

## 2019-05-09 DIAGNOSIS — I25119 Atherosclerotic heart disease of native coronary artery with unspecified angina pectoris: Secondary | ICD-10-CM | POA: Diagnosis not present

## 2019-05-09 DIAGNOSIS — R7989 Other specified abnormal findings of blood chemistry: Secondary | ICD-10-CM

## 2019-05-09 NOTE — Patient Instructions (Signed)
Medication Instructions: Your physician recommends that you continue on your current medications as directed. Please refer to the Current Medication list given to you today.   Labwork: None today  Procedures/Testing: None today  Follow-Up: 6 months office visit with Dr.McDowell  Any Additional Special Instructions Will Be Listed Below (If Applicable).     If you need a refill on your cardiac medications before your next appointment, please call your pharmacy.      Thank you for choosing [facility] !        

## 2019-05-09 NOTE — Progress Notes (Signed)
Cardiology Office Note  Date: 05/09/2019   ID: Theresa Norman, Theresa Norman 07/17/1940, MRN [id number]  PCP:  Benita Stabile, MD  Cardiologist:  Nona Dell, MD Electrophysiologist:  None   Chief Complaint  Patient presents with  . Cardiac follow-up    History of Present Illness: Theresa Norman is an 79 y.o. female former patient of Dr. Royann Shivers, last seen in the office by Mr. Vincenza Hews NP in January, that I am meeting for the first time today.  I reviewed extensive records and updated her chart.  She presents stating that she has been doing well overall.  No active angina symptoms or nitroglycerin use.  She has been walking regularly for exercise and feels well.  She was switched from Brilinta to Plavix in February.  She still has easy bruising, no major bleeding problems.  We did discuss plan for a full year of dual antiplatelet therapy following ACS in January of this year with DES to the proximal LAD.  I note that she had lab work per Dr. Margo Aye in March at which point her AST was 139 and ALT 234.  She had been placed on high-dose Lipitor around the time of ACS.  She has since been changed to Pravachol by Dr. Margo Aye, starting on it today in fact.  She has follow-up lab work planned with Dr. Margo Aye.   Past Medical History:  Diagnosis Date  . Anxiety   . Arthritis   . CAD (coronary artery disease), native coronary artery    DES to proximal LAD January 2021  . Essential hypertension   . Immune deficiency disorder (HCC)   . Mixed hyperlipidemia   . NSTEMI (non-ST elevated myocardial infarction) [facility])    January 2021    Past Surgical History:  Procedure Laterality Date  . CATARACT EXTRACTION W/PHACO Right 11/07/2016   Procedure: CATARACT EXTRACTION PHACO AND INTRAOCULAR LENS PLACEMENT RIGHT EYE;  Surgeon: Gemma Payor, MD;  Location: [facility];  Service: Ophthalmology;  Laterality: Right;  CDE: 13.30  . CATARACT EXTRACTION W/PHACO Left 11/20/2016   Procedure: CATARACT EXTRACTION PHACO AND  INTRAOCULAR LENS PLACEMENT (IOC);  Surgeon: Gemma Payor, MD;  Location: [facility];  Service: Ophthalmology;  Laterality: Left;  CDE: 20.00  . CORONARY STENT INTERVENTION N/A 01/29/2019   Procedure: CORONARY STENT INTERVENTION;  Surgeon: Iran Ouch, MD;  [facility];  Service: Cardiovascular;  Laterality: N/A;  . INTRAVASCULAR PRESSURE WIRE/FFR STUDY N/A 01/29/2019   Procedure: INTRAVASCULAR PRESSURE WIRE/FFR STUDY;  Surgeon: Iran Ouch, MD;  [facility]: [facility];  Service: Cardiovascular;  Laterality: N/A;  . LEFT HEART CATH AND CORONARY ANGIOGRAPHY N/A 01/29/2019   Procedure: LEFT HEART CATH AND CORONARY ANGIOGRAPHY;  Surgeon: Iran Ouch, MD;  [facility];  Service: Cardiovascular;  Laterality: N/A;    Current Outpatient Medications  Medication Sig Dispense Refill  . aspirin EC 81 MG tablet Take 81 mg by mouth daily.    . clopidogrel (PLAVIX) 75 MG tablet Please take 300 mg (4 tablets) on day 1 , then take 75 mg (1 tablet) daily. 94 tablet 3  . metoprolol succinate (TOPROL-XL) 25 MG 24 hr tablet Take 1 tablet (25 mg total) by mouth daily. 90 tablet 3  . nitroGLYCERIN (NITROSTAT) 0.4 MG SL tablet Place 1 tablet (0.4 mg total) under the tongue every 5 (five) minutes x 3 doses as needed for chest pain. 10 tablet 0  . pravastatin (PRAVACHOL) 40 MG tablet Take 40 mg  by mouth daily.     No current facility-administered medications for this visit.   Allergies:  Patient has no known allergies.   ROS: No palpitations or syncope.  Physical Exam: VS:  BP 138/80   Pulse 70   Temp 98.7 F (37.1 C)   Ht 5\' 1"  (1.549 m)   Wt 140 lb 1.6 oz (63.5 kg)   SpO2 97%   BMI 26.47 kg/m , BMI Body mass index is 26.47 kg/m.  Wt Readings from Last 3 Encounters:  05/09/19 140 lb 1.6 oz (63.5 kg)  02/06/19 150 lb 12.8 oz (68.4 kg)  01/30/19 151 lb 14.4 oz (68.9 kg)    General: Elderly woman, appears comfortable at rest. HEENT: Conjunctiva and lids  normal, wearing a mask. Neck: Supple, no elevated JVP or carotid bruits, no thyromegaly. Lungs: Clear to auscultation, nonlabored breathing at rest. Cardiac: Regular rate and rhythm, no S3, soft systolic murmur, no pericardial rub. Abdomen: Soft, nontender, bowel sounds present. Extremities: No pitting edema, distal pulses 2+. Skin: Scattered ecchymoses arms and legs.  ECG:  An ECG dated 02/06/2019 was personally reviewed today and demonstrated:  Sinus rhythm with left anterior fascicular block and increased voltage.  Recent Labwork: 01/29/2019: ALT 24; AST 59 01/30/2019: BUN 19; Creatinine, Ser 0.92; Hemoglobin 13.7; Magnesium 1.8; Platelets 227; Potassium 3.5; Sodium 139     Component Value Date/Time   CHOL 158 01/28/2019 2145   TRIG 104 01/28/2019 2145   HDL 57 01/28/2019 2145   CHOLHDL 2.8 01/28/2019 2145   VLDL 21 01/28/2019 2145   LDLCALC 80 01/28/2019 2145  March 2021: Hemoglobin 15, platelets 250, BUN 9, creatinine 0.88, potassium 5.0, AST 139, ALT 234, cholesterol 149, triglycerides 153, HDL 45, LDL 78, hemoglobin A1c is 5.5%.  Other Studies Reviewed Today:  Cardiac catheterization 01/29/2019:  The left ventricular systolic function is normal.  LV end diastolic pressure is normal.  The left ventricular ejection fraction is 55-65% by visual estimate.  3rd RPL lesion is 90% stenosed.  Prox LAD lesion is 70% stenosed.  Post intervention, there is a 0% residual stenosis.  A drug-eluting stent was successfully placed using a STENT RESOLUTE ONYX 2.5X15.   1.  Significant one-vessel coronary artery disease.  There is 70% stenosis in the proximal LAD which was significant by DFR at 0.88.  There is also significant stenosis in a small RPL3 supplying a small territory and thus this is unlikely to be the culprit. 2.  Normal LV systolic function and left ventricular end-diastolic pressure. 3.  Successful angioplasty and drug-eluting stent placement to the proximal  LAD.  Recommendations: Dual antiplatelet therapy for at least 12 months.  Aggressive treatment of risk factors. Likely discharge home tomorrow.  Echocardiogram 01/29/2019: 1. Left ventricular ejection fraction, by visual estimation, is 55 to  60%. The left ventricle has normal function. There is moderately increased  left ventricular hypertrophy.  2. The left ventricle has no regional wall motion abnormalities.  3. Global right ventricle has normal systolic function.The right  ventricular size is normal. No increase in right ventricular wall  thickness.  4. Left atrial size was normal.  5. Right atrial size was normal.  6. The mitral valve is normal in structure. No evidence of mitral valve  regurgitation. No evidence of mitral stenosis.  7. The tricuspid valve is normal in structure.  8. The tricuspid valve is normal in structure. Tricuspid valve  regurgitation is not demonstrated.  9. The aortic valve is normal in structure. Aortic valve regurgitation  is  not visualized. Mild aortic valve sclerosis without stenosis.  10. Pulmonic regurgitation is mild.  11. The pulmonic valve was normal in structure. Pulmonic valve  regurgitation is mild.  12. The inferior vena cava is normal in size with greater than 50%  respiratory variability, suggesting right atrial pressure of 3 mmHg.  13. The average left ventricular global longitudinal strain is -9.7 %.   Assessment and Plan:  1.  CAD status post NSTEMI in January of this year with placement of DES to the proximal LAD.  Residual disease being managed medically as noted above.  She is doing well symptomatically without active angina.  Continue aspirin and Plavix with anticipated 1 year course overall, agree with switch from Lipitor to Pravachol for now with reassessment of LFTs per PCP, continue Toprol-XL.  2.  Mixed hyperlipidemia, initially on high-dose Lipitor and then just recently switched to Pravachol in light of elevated AST  and ALT.  She has follow-up scheduled with Dr. Margo Aye.  If liver function tests do not normalize would suggest evaluation by GI to investigate for other possible causes unrelated to statin therapy.   Medication Adjustments/Labs and Tests Ordered: Current medicines are reviewed at length with the patient today.  Concerns regarding medicines are outlined above.   Tests Ordered: No orders of the defined types were placed in this encounter.   Medication Changes: No orders of the defined types were placed in this encounter.   Disposition:  Follow up 6 months in the [place] office.  Signed, Jonelle Sidle, MD, [facility] 05/09/2019 11:22 AM    [facility] at [facility] [address], [place], [place] [address] Phone: [phone]; Fax: [phone]

## 2019-05-15 DIAGNOSIS — M1712 Unilateral primary osteoarthritis, left knee: Secondary | ICD-10-CM | POA: Diagnosis not present

## 2019-05-28 DIAGNOSIS — R945 Abnormal results of liver function studies: Secondary | ICD-10-CM | POA: Diagnosis not present

## 2019-05-28 DIAGNOSIS — I10 Essential (primary) hypertension: Secondary | ICD-10-CM | POA: Diagnosis not present

## 2019-05-28 DIAGNOSIS — E782 Mixed hyperlipidemia: Secondary | ICD-10-CM | POA: Diagnosis not present

## 2019-05-28 DIAGNOSIS — E559 Vitamin D deficiency, unspecified: Secondary | ICD-10-CM | POA: Diagnosis not present

## 2019-05-28 DIAGNOSIS — I2510 Atherosclerotic heart disease of native coronary artery without angina pectoris: Secondary | ICD-10-CM | POA: Diagnosis not present

## 2019-05-28 DIAGNOSIS — M25562 Pain in left knee: Secondary | ICD-10-CM | POA: Diagnosis not present

## 2019-06-11 DIAGNOSIS — E7849 Other hyperlipidemia: Secondary | ICD-10-CM | POA: Diagnosis not present

## 2019-06-11 DIAGNOSIS — I10 Essential (primary) hypertension: Secondary | ICD-10-CM | POA: Diagnosis not present

## 2019-07-24 DIAGNOSIS — N182 Chronic kidney disease, stage 2 (mild): Secondary | ICD-10-CM | POA: Diagnosis not present

## 2019-07-24 DIAGNOSIS — R7301 Impaired fasting glucose: Secondary | ICD-10-CM | POA: Diagnosis not present

## 2019-07-24 DIAGNOSIS — I129 Hypertensive chronic kidney disease with stage 1 through stage 4 chronic kidney disease, or unspecified chronic kidney disease: Secondary | ICD-10-CM | POA: Diagnosis not present

## 2019-07-24 DIAGNOSIS — I2102 ST elevation (STEMI) myocardial infarction involving left anterior descending coronary artery: Secondary | ICD-10-CM | POA: Diagnosis not present

## 2019-07-24 DIAGNOSIS — Z0000 Encounter for general adult medical examination without abnormal findings: Secondary | ICD-10-CM | POA: Diagnosis not present

## 2019-07-24 DIAGNOSIS — R946 Abnormal results of thyroid function studies: Secondary | ICD-10-CM | POA: Diagnosis not present

## 2019-07-24 DIAGNOSIS — R7309 Other abnormal glucose: Secondary | ICD-10-CM | POA: Diagnosis not present

## 2019-07-29 DIAGNOSIS — I129 Hypertensive chronic kidney disease with stage 1 through stage 4 chronic kidney disease, or unspecified chronic kidney disease: Secondary | ICD-10-CM | POA: Diagnosis not present

## 2019-07-29 DIAGNOSIS — M1712 Unilateral primary osteoarthritis, left knee: Secondary | ICD-10-CM | POA: Diagnosis not present

## 2019-07-29 DIAGNOSIS — N182 Chronic kidney disease, stage 2 (mild): Secondary | ICD-10-CM | POA: Diagnosis not present

## 2019-07-29 DIAGNOSIS — E559 Vitamin D deficiency, unspecified: Secondary | ICD-10-CM | POA: Diagnosis not present

## 2019-07-29 DIAGNOSIS — R7301 Impaired fasting glucose: Secondary | ICD-10-CM | POA: Diagnosis not present

## 2019-07-29 DIAGNOSIS — I10 Essential (primary) hypertension: Secondary | ICD-10-CM | POA: Diagnosis not present

## 2019-07-29 DIAGNOSIS — E782 Mixed hyperlipidemia: Secondary | ICD-10-CM | POA: Diagnosis not present

## 2019-08-15 ENCOUNTER — Other Ambulatory Visit: Payer: Self-pay

## 2019-08-15 ENCOUNTER — Ambulatory Visit
Admission: EM | Admit: 2019-08-15 | Discharge: 2019-08-15 | Disposition: A | Discharge status: Home / Self Care | Payer: Medicare Other | Attending: Emergency Medicine | Admitting: Emergency Medicine

## 2019-08-15 DIAGNOSIS — Z1152 Encounter for screening for COVID-19: Secondary | ICD-10-CM

## 2019-08-15 DIAGNOSIS — Z20822 Contact with and (suspected) exposure to covid-19: Secondary | ICD-10-CM | POA: Diagnosis not present

## 2019-08-15 NOTE — ED Triage Notes (Signed)
Pt here for covid testing after exposure at church

## 2019-08-15 NOTE — Discharge Instructions (Signed)

## 2019-08-15 NOTE — ED Provider Notes (Signed)
[facility]   [id number] 08/15/19 Arrival Time: 1032   CC: COVID test  SUBJECTIVE: History from: patient.  Theresa Norman is a 79 y.o. female who presents for COVID testing.  Admits to COVID exposure at church 5 days ago.  Currently asymptomatic.  Denies aggravating or alleviating symptoms.  Denies previous COVID infection.   Denies fever, chills, fatigue, nasal congestion, rhinorrhea, sore throat, cough, SOB, wheezing, chest pain, nausea, vomiting, changes in bowel or bladder habits.     ROS: As per HPI.  All other pertinent ROS negative.     Past Medical History:  Diagnosis Date  . Anxiety   . Arthritis   . CAD (coronary artery disease), native coronary artery    DES to proximal LAD January 2021  . Essential hypertension   . Immune deficiency disorder (HCC)   . Mixed hyperlipidemia   . NSTEMI (non-ST elevated myocardial infarction) [facility])    January 2021   Past Surgical History:  Procedure Laterality Date  . CATARACT EXTRACTION W/PHACO Right 11/07/2016   Procedure: CATARACT EXTRACTION PHACO AND INTRAOCULAR LENS PLACEMENT RIGHT EYE;  Surgeon: Gemma Payor, MD;  Location: [facility];  Service: Ophthalmology;  Laterality: Right;  CDE: 13.30  . CATARACT EXTRACTION W/PHACO Left 11/20/2016   Procedure: CATARACT EXTRACTION PHACO AND INTRAOCULAR LENS PLACEMENT (IOC);  Surgeon: Gemma Payor, MD;  Location: [facility];  Service: Ophthalmology;  Laterality: Left;  CDE: 20.00  . CORONARY STENT INTERVENTION N/A 01/29/2019   Procedure: CORONARY STENT INTERVENTION;  Surgeon: Iran Ouch, MD;  [facility];  Service: Cardiovascular;  Laterality: N/A;  . INTRAVASCULAR PRESSURE WIRE/FFR STUDY N/A 01/29/2019   Procedure: INTRAVASCULAR PRESSURE WIRE/FFR STUDY;  Surgeon: Iran Ouch, MD;  [facility]: [facility];  Service: Cardiovascular;  Laterality: N/A;  . LEFT HEART CATH AND CORONARY ANGIOGRAPHY N/A 01/29/2019   Procedure: LEFT HEART CATH AND CORONARY ANGIOGRAPHY;   Surgeon: Iran Ouch, MD;  [facility]: [facility];  Service: Cardiovascular;  Laterality: N/A;   No Known Allergies No current facility-administered medications on file prior to encounter.   Current Outpatient Medications on File Prior to Encounter  Medication Sig Dispense Refill  . aspirin EC 81 MG tablet Take 81 mg by mouth daily.    . clopidogrel (PLAVIX) 75 MG tablet Please take 300 mg (4 tablets) on day 1 , then take 75 mg (1 tablet) daily. 94 tablet 3  . metoprolol succinate (TOPROL-XL) 25 MG 24 hr tablet Take 1 tablet (25 mg total) by mouth daily. 90 tablet 3  . nitroGLYCERIN (NITROSTAT) 0.4 MG SL tablet Place 1 tablet (0.4 mg total) under the tongue every 5 (five) minutes x 3 doses as needed for chest pain. 10 tablet 0  . pravastatin (PRAVACHOL) 40 MG tablet Take 40 mg by mouth daily.     Social History   Socioeconomic History  . Marital status: Married    Spouse name: Not on file  . Number of children: Not on file  . Years of education: Not on file  . Highest education level: Not on file  Occupational History  . Not on file  Tobacco Use  . Smoking status: Never Smoker  . Smokeless tobacco: Never Used  Vaping Use  . Vaping Use: Never used  Substance and Sexual Activity  . Alcohol use: Never  . Drug use: Never  . Sexual activity: Not on file  Other Topics Concern  . Not on file  Social History Narrative   **  Merged History Encounter **       Social Determinants of Health   Financial Resource Strain:   . Difficulty of Paying Living Expenses:   Food Insecurity:   . Worried About Programme researcher, broadcasting/film/video in the Last Year:   . Barista in the Last Year:   Transportation Needs:   . Freight forwarder (Medical):   Marland Kitchen Lack of Transportation (Non-Medical):   Physical Activity:   . Days of Exercise per Week:   . Minutes of Exercise per Session:   Stress:   . Feeling of Stress :   Social Connections:   . Frequency of Communication with Friends and  Family:   . Frequency of Social Gatherings with Friends and Family:   . Attends Religious Services:   . Active Member of Clubs or Organizations:   . Attends Banker Meetings:   Marland Kitchen Marital Status:   Intimate Partner Violence:   . Fear of Current or Ex-Partner:   . Emotionally Abused:   Marland Kitchen Physically Abused:   . Sexually Abused:    Family History  Problem Relation Age of Onset  . Hypertension Father     OBJECTIVE:  Vitals:   08/15/19 1054  BP: (!) 164/77  Pulse: 77  Resp: 18  Temp: 98.4 F (36.9 C)  SpO2: 97%     General appearance: alert; well-appearing, nontoxic; speaking in full sentences and tolerating own secretions HEENT: NCAT; Ears: EACs clear, TMs pearly gray; Eyes: PERRL.  EOM grossly intact.Nose: nares patent without rhinorrhea, Throat: oropharynx clear, tonsils non erythematous or enlarged, uvula midline  Neck: supple without LAD Lungs: unlabored respirations, symmetrical air entry; cough: absent; no respiratory distress; CTAB Heart: regular rate and rhythm.  Skin: warm and dry Psychological: alert and cooperative; normal mood and affect   ASSESSMENT & PLAN:  1. Encounter for screening for COVID-19   2. Exposure to COVID-19 virus   3. Encounter for laboratory testing for COVID-19 virus    COVID testing ordered.  It will take between 2-5 days for test results.  Someone will contact you regarding abnormal results.    In the meantime:  If you were to develop symptoms: You should remain isolated in your home for 10 days from symptom onset AND greater than 72 hours after symptoms resolution (absence of fever without the use of fever-reducing medication and improvement in respiratory symptoms), whichever is longer If you have had exposure: you should remain in quarantine for 7 days from exposure.  However, if symptoms develop you must self isolate and return for retesting Get plenty of rest and push fluids Follow up with PCP as needed Call or go to the  ED if you have any new symptoms such as fever, cough, shortness of breath, chest tightness, chest pain, turning blue, changes in mental status, etc...   Reviewed expectations re: course of current medical issues. Questions answered. Outlined signs and symptoms indicating need for more acute intervention. Patient verbalized understanding. After Visit Summary given.         Rennis Harding, PA-C 08/15/19 1111

## 2019-08-16 LAB — NOVEL CORONAVIRUS, NAA: SARS-CoV-2, NAA: NOT DETECTED

## 2019-08-16 LAB — SARS-COV-2, NAA 2 DAY TAT

## 2019-08-26 DIAGNOSIS — I129 Hypertensive chronic kidney disease with stage 1 through stage 4 chronic kidney disease, or unspecified chronic kidney disease: Secondary | ICD-10-CM | POA: Diagnosis not present

## 2019-08-26 DIAGNOSIS — Z0000 Encounter for general adult medical examination without abnormal findings: Secondary | ICD-10-CM | POA: Diagnosis not present

## 2019-08-26 DIAGNOSIS — R946 Abnormal results of thyroid function studies: Secondary | ICD-10-CM | POA: Diagnosis not present

## 2019-08-26 DIAGNOSIS — R7301 Impaired fasting glucose: Secondary | ICD-10-CM | POA: Diagnosis not present

## 2019-08-26 DIAGNOSIS — N182 Chronic kidney disease, stage 2 (mild): Secondary | ICD-10-CM | POA: Diagnosis not present

## 2019-08-26 DIAGNOSIS — I10 Essential (primary) hypertension: Secondary | ICD-10-CM | POA: Diagnosis not present

## 2019-08-26 DIAGNOSIS — I2102 ST elevation (STEMI) myocardial infarction involving left anterior descending coronary artery: Secondary | ICD-10-CM | POA: Diagnosis not present

## 2019-08-26 DIAGNOSIS — E782 Mixed hyperlipidemia: Secondary | ICD-10-CM | POA: Diagnosis not present

## 2019-08-26 DIAGNOSIS — E559 Vitamin D deficiency, unspecified: Secondary | ICD-10-CM | POA: Diagnosis not present

## 2019-09-10 DIAGNOSIS — N182 Chronic kidney disease, stage 2 (mild): Secondary | ICD-10-CM | POA: Diagnosis not present

## 2019-09-10 DIAGNOSIS — E559 Vitamin D deficiency, unspecified: Secondary | ICD-10-CM | POA: Diagnosis not present

## 2019-09-10 DIAGNOSIS — E782 Mixed hyperlipidemia: Secondary | ICD-10-CM | POA: Diagnosis not present

## 2019-09-10 DIAGNOSIS — I129 Hypertensive chronic kidney disease with stage 1 through stage 4 chronic kidney disease, or unspecified chronic kidney disease: Secondary | ICD-10-CM | POA: Diagnosis not present

## 2019-09-10 DIAGNOSIS — I10 Essential (primary) hypertension: Secondary | ICD-10-CM | POA: Diagnosis not present

## 2019-09-29 ENCOUNTER — Encounter: Payer: Self-pay | Admitting: Cardiology

## 2019-09-29 DIAGNOSIS — R55 Syncope and collapse: Secondary | ICD-10-CM | POA: Diagnosis not present

## 2019-09-29 DIAGNOSIS — I2102 ST elevation (STEMI) myocardial infarction involving left anterior descending coronary artery: Secondary | ICD-10-CM | POA: Diagnosis not present

## 2019-09-29 DIAGNOSIS — N182 Chronic kidney disease, stage 2 (mild): Secondary | ICD-10-CM | POA: Diagnosis not present

## 2019-09-29 DIAGNOSIS — I129 Hypertensive chronic kidney disease with stage 1 through stage 4 chronic kidney disease, or unspecified chronic kidney disease: Secondary | ICD-10-CM | POA: Diagnosis not present

## 2019-09-29 DIAGNOSIS — Z136 Encounter for screening for cardiovascular disorders: Secondary | ICD-10-CM | POA: Diagnosis not present

## 2019-09-29 DIAGNOSIS — Z0000 Encounter for general adult medical examination without abnormal findings: Secondary | ICD-10-CM | POA: Diagnosis not present

## 2019-09-29 DIAGNOSIS — I10 Essential (primary) hypertension: Secondary | ICD-10-CM | POA: Diagnosis not present

## 2019-10-01 NOTE — Progress Notes (Signed)
Cardiology Office Note  Date: 10/02/2019   ID: Bennie, Scaff Oct 06, 1940, MRN [id number]  PCP:  Benita Stabile, MD  Cardiologist:  Nona Dell, MD Electrophysiologist:  None   Chief Complaint: Recent syncope with vomiting, feeling funny, diaphoretic  History of Present Illness: Theresa Norman is a 79 y.o. female with a history of CAD/NSTEMI January 2021 (DES to proximal LAD January 2021), HTN, HLD, arthritis.  Last encounter with Dr. Diona Browner 05/09/2019.  She stated she had been doing well overall.  Having no active anginal symptoms or nitroglycerin use.  She was walking regularly for exercise of.  She had been switched from Brilinta to Plavix in February.  No bleeding problems.  Lab work in March showed elevated LFTs: AST 139, ALT 334.  He had previously been on high-dose Lipitor.  Her PCP changed Lipitor to Pravachol and she had recently started the medication.  She was continuing aspirin and Plavix.  Dr. Diona Browner stated if LFTs remained elevated in spite of statin change suggest GI to investigate for other possible causes unrelated to statin therapy.  Patient is being referred back to [place] for recent syncopal episode.  Recently seen at PCP office 09/29/2019.  She presented to their office for passing out on the prior Saturday.  She was at acute when she started walking back to her house started feeling faint, diaphoretic had witnessed syncopal episode with vomiting prior to syncopal episode.  EMS was called.  She felt drained over the weekend but no other episodes.  Office note states patient reported some palpitations.  Patient states she has no recollection of this.  She states the only other time she passed out and had similar symptoms worse when she had her heart attack.  She is post NSTEMI January 2021 was DES to proximal LAD.  She had a third RPL branch which was thought not to be the culprit vessel.  There was no intervention on this vessel.  She denies any subsequent episodes.   Denies any anginal or exertional symptoms, palpitations or arrhythmias, orthostatic symptoms, PND or orthopnea, CVA or TIA-like symptoms, DVT or PE-like symptoms, claudication-like symptoms, or lower extremity edema.   Past Medical History:  Diagnosis Date  . Anxiety   . Arthritis   . CAD (coronary artery disease), native coronary artery    DES to proximal LAD January 2021  . Essential hypertension   . Immune deficiency disorder (HCC)   . Mixed hyperlipidemia   . NSTEMI (non-ST elevated myocardial infarction) [facility])    January 2021    Past Surgical History:  Procedure Laterality Date  . CATARACT EXTRACTION W/PHACO Right 11/07/2016   Procedure: CATARACT EXTRACTION PHACO AND INTRAOCULAR LENS PLACEMENT RIGHT EYE;  Surgeon: Gemma Payor, MD;  Location: [facility];  Service: Ophthalmology;  Laterality: Right;  CDE: 13.30  . CATARACT EXTRACTION W/PHACO Left 11/20/2016   Procedure: CATARACT EXTRACTION PHACO AND INTRAOCULAR LENS PLACEMENT (IOC);  Surgeon: Gemma Payor, MD;  Location: [facility];  Service: Ophthalmology;  Laterality: Left;  CDE: 20.00  . CORONARY STENT INTERVENTION N/A 01/29/2019   Procedure: CORONARY STENT INTERVENTION;  Surgeon: Iran Ouch, MD;  [facility];  Service: Cardiovascular;  Laterality: N/A;  . INTRAVASCULAR PRESSURE WIRE/FFR STUDY N/A 01/29/2019   Procedure: INTRAVASCULAR PRESSURE WIRE/FFR STUDY;  Surgeon: Iran Ouch, MD;  Location: [facility];  Service: Cardiovascular;  Laterality: N/A;  . LEFT HEART CATH AND CORONARY ANGIOGRAPHY N/A 01/29/2019   Procedure: LEFT HEART CATH AND  CORONARY ANGIOGRAPHY;  Surgeon: Iran Ouch, MD;  [facility]: [facility];  Service: Cardiovascular;  Laterality: N/A;    Current Outpatient Medications  Medication Sig Dispense Refill  . aspirin EC 81 MG tablet Take 81 mg by mouth daily.    . clopidogrel (PLAVIX) 75 MG tablet Please take 300 mg (4 tablets) on day 1 , then take 75 mg (1 tablet) daily. 94  tablet 3  . metoprolol succinate (TOPROL-XL) 25 MG 24 hr tablet Take 1 tablet (25 mg total) by mouth daily. 90 tablet 3  . nitroGLYCERIN (NITROSTAT) 0.4 MG SL tablet Place 1 tablet (0.4 mg total) under the tongue every 5 (five) minutes x 3 doses as needed for chest pain. 10 tablet 0  . pravastatin (PRAVACHOL) 40 MG tablet Take 40 mg by mouth daily.     No current facility-administered medications for this visit.   Allergies:  Patient has no known allergies.   Social History: The patient  reports that she has never smoked. She has never used smokeless tobacco. She reports that she does not drink alcohol and does not use drugs.   Family History: The patient's family history includes Hypertension in her father.   ROS:  Please see the history of present illness. Otherwise, complete review of systems is positive for none.  All other systems are reviewed and negative.   Physical Exam: VS:  BP (!) 158/88   Pulse 70   Ht 5\' 1"  (1.549 m)   Wt 125 lb 9.6 oz (57 kg)   SpO2 93%   BMI 23.73 kg/m , BMI Body mass index is 23.73 kg/m.  Wt Readings from Last 3 Encounters:  10/02/19 125 lb 9.6 oz (57 kg)  05/09/19 140 lb 1.6 oz (63.5 kg)  02/06/19 150 lb 12.8 oz (68.4 kg)    General: Patient appears comfortable at rest. Neck: Supple, no elevated JVP or carotid bruits, no thyromegaly. Lungs: Clear to auscultation, nonlabored breathing at rest. Cardiac: Regular rate and rhythm, no S3 or significant systolic murmur, no pericardial rub. Extremities: No pitting edema, distal pulses 2+. Skin: Warm and dry. Musculoskeletal: No kyphosis. Neuropsychiatric: Alert and oriented x3, affect grossly appropriate.  ECG:  Last EKG February 06, 2019 normal sinus rhythm rate of 83, LAFB, voltage criteria for LVH, nonspecific T wave abnormality, prolonged QT.  QT/QTc 402/472 ms.  Recent Labwork: 01/29/2019: ALT 24; AST 59 01/30/2019: BUN 19; Creatinine, Ser 0.92; Hemoglobin 13.7; Magnesium 1.8; Platelets 227;  Potassium 3.5; Sodium 139     Component Value Date/Time   CHOL 158 01/28/2019 2145   TRIG 104 01/28/2019 2145   HDL 57 01/28/2019 2145   CHOLHDL 2.8 01/28/2019 2145   VLDL 21 01/28/2019 2145   LDLCALC 80 01/28/2019 2145    Other Studies Reviewed Today:  Cardiac catheterization 01/29/2019:  The left ventricular systolic function is normal.  LV end diastolic pressure is normal.  The left ventricular ejection fraction is 55-65% by visual estimate.  3rd RPL lesion is 90% stenosed.  Prox LAD lesion is 70% stenosed.  Post intervention, there is a 0% residual stenosis.  A drug-eluting stent was successfully placed using a STENT RESOLUTE ONYX 2.5X15.  1. Significant one-vessel coronary artery disease. There is 70% stenosis in the proximal LAD which was significant by DFR at 0.88. There is also significant stenosis in a small RPL3 supplying a small territory and thus this is unlikely to be the culprit. 2. Normal LV systolic function and left ventricular end-diastolic pressure. 3. Successful  angioplasty and drug-eluting stent placement to the proximal LAD.  Recommendations: Dual antiplatelet therapy for at least 12 months. Aggressive treatment of risk factors. Likely discharge home tomorrow. Diagnostic Dominance: Right  Intervention      Echocardiogram 01/29/2019: 1. Left ventricular ejection fraction, by visual estimation, is 55 to  60%. The left ventricle has normal function. There is moderately increased  left ventricular hypertrophy.  2. The left ventricle has no regional wall motion abnormalities.  3. Global right ventricle has normal systolic function.The right  ventricular size is normal. No increase in right ventricular wall  thickness.  4. Left atrial size was normal.  5. Right atrial size was normal.  6. The mitral valve is normal in structure. No evidence of mitral valve  regurgitation. No evidence of mitral stenosis.  7. The tricuspid valve is  normal in structure.  8. The tricuspid valve is normal in structure. Tricuspid valve  regurgitation is not demonstrated.  9. The aortic valve is normal in structure. Aortic valve regurgitation is  not visualized. Mild aortic valve sclerosis without stenosis.  10. Pulmonic regurgitation is mild.  11. The pulmonic valve was normal in structure. Pulmonic valve  regurgitation is mild.  12. The inferior vena cava is normal in size with greater than 50%  respiratory variability, suggesting right atrial pressure of 3 mmHg.  13. The average left ventricular global longitudinal strain is -9.7 %  Assessment and Plan:  1. CAD in native artery   2. Mixed hyperlipidemia   3. Syncope and collapse    1. CAD in native artery NSTEMI in January 2021 with DES to proximal LAD.  Had a third RPL branch with 90% stenosis which was not intervened on.  She currently denies any anginal or exertional symptoms.  He states she had similar symptoms to #3 below when she had her MI.  Continue aspirin 81 mg daily, Plavix 75 mg p.o. daily, nitroglycerin 0.4 mg as needed.  Toprol-XL 25 mg daily.  2. Mixed hyperlipidemia Continue pravastatin 40 mg daily.  Her LFTs have returned to normal since switching to pravastatin.  AST is 17, ALT 10 as of 05/29/2019.  3. Syncope and collapse Recent syncopal episode while outside during a cookout.  She states she was walking back to her the house and started feeling faint, diaphoretic and nauseated then vomited and passed out.  Recently saw Etheleen Sia NP at Dr. Scharlene Gloss office.  NP mentions patient reported palpitations.  Patient denies mentioning any palpitations.  She denies any recent palpitations.  Denies any orthostatic symptoms, CVA or TIA-like symptoms.  Symptoms sound as though this may have been a vasovagal episode.  She has had no recurrences.  Medication Adjustments/Labs and Tests Ordered: Current medicines are reviewed at length with the patient today.  Concerns  regarding medicines are outlined above.   Disposition: Follow-up with Dr. Diona Browner at scheduled visit on November 1, 11 AM.  Signed, Rennis Harding, NP 10/02/2019 11:05 AM    [facility] [address] [place], [place], [place] [address] Phone: [phone]; Fax: [phone]

## 2019-10-02 ENCOUNTER — Ambulatory Visit: Payer: Medicare Other | Admitting: Family Medicine

## 2019-10-02 ENCOUNTER — Encounter: Payer: Self-pay | Admitting: Family Medicine

## 2019-10-02 VITALS — BP 158/88 | HR 70 | Ht 61.0 in | Wt 125.6 lb

## 2019-10-02 DIAGNOSIS — E782 Mixed hyperlipidemia: Secondary | ICD-10-CM

## 2019-10-02 DIAGNOSIS — R55 Syncope and collapse: Secondary | ICD-10-CM

## 2019-10-02 DIAGNOSIS — I2510 Atherosclerotic heart disease of native coronary artery without angina pectoris: Secondary | ICD-10-CM | POA: Diagnosis not present

## 2019-10-02 NOTE — Patient Instructions (Signed)
Medication Instructions:  Continue all current medications.  Labwork: none  Testing/Procedures: none  Follow-Up: As scheduled   Any Other Special Instructions Will Be Listed Below (If Applicable).   If you need a refill on your cardiac medications before your next appointment, please call your pharmacy.  

## 2019-11-03 DIAGNOSIS — N182 Chronic kidney disease, stage 2 (mild): Secondary | ICD-10-CM | POA: Diagnosis not present

## 2019-11-03 DIAGNOSIS — E782 Mixed hyperlipidemia: Secondary | ICD-10-CM | POA: Diagnosis not present

## 2019-11-03 DIAGNOSIS — I129 Hypertensive chronic kidney disease with stage 1 through stage 4 chronic kidney disease, or unspecified chronic kidney disease: Secondary | ICD-10-CM | POA: Diagnosis not present

## 2019-11-03 DIAGNOSIS — E559 Vitamin D deficiency, unspecified: Secondary | ICD-10-CM | POA: Diagnosis not present

## 2019-11-03 DIAGNOSIS — I10 Essential (primary) hypertension: Secondary | ICD-10-CM | POA: Diagnosis not present

## 2019-11-10 ENCOUNTER — Ambulatory Visit: Payer: Medicare Other | Admitting: Cardiology

## 2019-11-10 ENCOUNTER — Encounter: Payer: Self-pay | Admitting: Cardiology

## 2019-11-10 VITALS — BP 152/90 | HR 66 | Ht 61.0 in | Wt 122.0 lb

## 2019-11-10 DIAGNOSIS — E782 Mixed hyperlipidemia: Secondary | ICD-10-CM

## 2019-11-10 DIAGNOSIS — I25119 Atherosclerotic heart disease of native coronary artery with unspecified angina pectoris: Secondary | ICD-10-CM

## 2019-11-10 NOTE — Patient Instructions (Signed)

## 2019-11-10 NOTE — Progress Notes (Signed)
Cardiology Office Note  Date: 11/10/2019   ID: Theresa Norman, DOB [birth year], MRN [id number]  PCP:  Benita Stabile, MD  Cardiologist:  Nona Dell, MD Electrophysiologist:  None   Chief Complaint  Patient presents with  . Cardiac follow-up    History of Present Illness: Theresa Norman is an 79 y.o. female last seen in September by Mr. Vincenza Hews NP.  She presents for a routine visit.  She does not report any active angina at this time, no dizziness or syncope (last visit with Mr. Vincenza Hews NP noted with suspicion of an episode of vasovagal syncope.)  She had follow-up lab work in May with Dr. Margo Aye, LFTs normalized at that time.  She is now on low-dose Crestor and anticipates follow-up assessment of lipids with Dr. Margo Aye at visit in December.  I reviewed the remainder of her medications which are outlined below.  Today's blood pressure was elevated.  She does check it at home, states that her systolic is usually lower.  I have asked her to check this in the few weeks preceding her visit with Dr. Margo Aye in December.  She may need additional therapy.  Past Medical History:  Diagnosis Date  . Anxiety   . Arthritis   . CAD (coronary artery disease), native coronary artery    DES to proximal LAD January 2021  . Essential hypertension   . Immune deficiency disorder (HCC)   . Mixed hyperlipidemia   . NSTEMI (non-ST elevated myocardial infarction) [facility])    January 2021    Past Surgical History:  Procedure Laterality Date  . CATARACT EXTRACTION W/PHACO Right 11/07/2016   Procedure: CATARACT EXTRACTION PHACO AND INTRAOCULAR LENS PLACEMENT RIGHT EYE;  Surgeon: Gemma Payor, MD;  Location: [facility];  Service: Ophthalmology;  Laterality: Right;  CDE: 13.30  . CATARACT EXTRACTION W/PHACO Left 11/20/2016   Procedure: CATARACT EXTRACTION PHACO AND INTRAOCULAR LENS PLACEMENT (IOC);  Surgeon: Gemma Payor, MD;  Location: [facility];  Service: Ophthalmology;  Laterality: Left;  CDE: 20.00  . CORONARY  STENT INTERVENTION N/A 01/29/2019   Procedure: CORONARY STENT INTERVENTION;  Surgeon: Iran Ouch, MD;  [facility];  Service: Cardiovascular;  Laterality: N/A;  . INTRAVASCULAR PRESSURE WIRE/FFR STUDY N/A 01/29/2019   Procedure: INTRAVASCULAR PRESSURE WIRE/FFR STUDY;  Surgeon: Iran Ouch, MD;  [facility];  Service: Cardiovascular;  Laterality: N/A;  . LEFT HEART CATH AND CORONARY ANGIOGRAPHY N/A 01/29/2019   Procedure: LEFT HEART CATH AND CORONARY ANGIOGRAPHY;  Surgeon: Iran Ouch, MD;  [facility];  Service: Cardiovascular;  Laterality: N/A;    Current Outpatient Medications  Medication Sig Dispense Refill  . aspirin EC 81 MG tablet Take 81 mg by mouth daily.    . clopidogrel (PLAVIX) 75 MG tablet Take 1 tablet by mouth daily.    . metoprolol succinate (TOPROL-XL) 25 MG 24 hr tablet Take 1 tablet (25 mg total) by mouth daily. 90 tablet 3  . nitroGLYCERIN (NITROSTAT) 0.4 MG SL tablet Place 1 tablet (0.4 mg total) under the tongue every 5 (five) minutes x 3 doses as needed for chest pain. 10 tablet 0  . rosuvastatin (CRESTOR) 5 MG tablet Take 5 mg by mouth daily.     No current facility-administered medications for this visit.   Allergies:  Patient has no known allergies.   ROS: No palpitations or syncope.  Physical Exam: VS:  BP (!) 152/90   Pulse 66   Ht 5\' 1"  (  1.549 m)   Wt 122 lb (55.3 kg)   SpO2 97%   BMI 23.05 kg/m , BMI Body mass index is 23.05 kg/m.  Wt Readings from Last 3 Encounters:  11/10/19 122 lb (55.3 kg)  10/02/19 125 lb 9.6 oz (57 kg)  05/09/19 140 lb 1.6 oz (63.5 kg)    General: Elderly woman, appears comfortable at rest. HEENT: Conjunctiva and lids normal, wearing a mask. Neck: Supple, no elevated JVP or carotid bruits, no thyromegaly. Lungs: Clear to auscultation, nonlabored breathing at rest. Cardiac: Regular rate and rhythm, no S3, soft systolic murmur, no pericardial rub. Extremities:  No pitting edema, distal pulses 2+.  ECG:  An ECG dated 02/06/2019 was personally reviewed today and demonstrated:  Sinus rhythm with LVH, leftward axis, nonspecific T wave changes.  Recent Labwork: 01/29/2019: ALT 24; AST 59 01/30/2019: BUN 19; Creatinine, Ser 0.92; Hemoglobin 13.7; Magnesium 1.8; Platelets 227; Potassium 3.5; Sodium 139     Component Value Date/Time   CHOL 158 01/28/2019 2145   TRIG 104 01/28/2019 2145   HDL 57 01/28/2019 2145   CHOLHDL 2.8 01/28/2019 2145   VLDL 21 01/28/2019 2145   LDLCALC 80 01/28/2019 2145  May 2021: BUN 12, creatinine 0.93, potassium 5.2, AST 17, ALT 10  Other Studies Reviewed Today:  Cardiac catheterization 01/29/2019:  The left ventricular systolic function is normal.  LV end diastolic pressure is normal.  The left ventricular ejection fraction is 55-65% by visual estimate.  3rd RPL lesion is 90% stenosed.  Prox LAD lesion is 70% stenosed.  Post intervention, there is a 0% residual stenosis.  A drug-eluting stent was successfully placed using a STENT RESOLUTE ONYX 2.5X15.  1. Significant one-vessel coronary artery disease. There is 70% stenosis in the proximal LAD which was significant by DFR at 0.88. There is also significant stenosis in a small RPL3 supplying a small territory and thus this is unlikely to be the culprit. 2. Normal LV systolic function and left ventricular end-diastolic pressure. 3. Successful angioplasty and drug-eluting stent placement to the proximal LAD.  Recommendations: Dual antiplatelet therapy for at least 12 months. Aggressive treatment of risk factors. Likely discharge home tomorrow.  Echocardiogram 01/29/2019: 1. Left ventricular ejection fraction, by visual estimation, is 55 to  60%. The left ventricle has normal function. There is moderately increased  left ventricular hypertrophy.  2. The left ventricle has no regional wall motion abnormalities.  3. Global right ventricle has normal  systolic function.The right  ventricular size is normal. No increase in right ventricular wall  thickness.  4. Left atrial size was normal.  5. Right atrial size was normal.  6. The mitral valve is normal in structure. No evidence of mitral valve  regurgitation. No evidence of mitral stenosis.  7. The tricuspid valve is normal in structure.  8. The tricuspid valve is normal in structure. Tricuspid valve  regurgitation is not demonstrated.  9. The aortic valve is normal in structure. Aortic valve regurgitation is  not visualized. Mild aortic valve sclerosis without stenosis.  10. Pulmonic regurgitation is mild.  11. The pulmonic valve was normal in structure. Pulmonic valve  regurgitation is mild.  12. The inferior vena cava is normal in size with greater than 50%  respiratory variability, suggesting right atrial pressure of 3 mmHg.  13. The average left ventricular global longitudinal strain is -9.7 %.   Assessment and Plan:  1.  CAD status post DES to the proximal LAD in January in the setting of NSTEMI.  She  is clinically stable without active angina.  Continue aspirin, Plavix, Toprol-XL, and Crestor.  2.  Mixed hyperlipidemia.  Transaminitis has resolved following discontinuation of high-dose Lipitor.  She is tolerating low-dose Crestor, last LFTs were normal.  Check FLP with Dr. Margo Aye at visit in December.  Goal LDL should be under 70.  3.  Elevated blood pressure.  She will check home blood pressure recordings in the 2 weeks preceding her visit with Dr. Margo Aye.  If blood pressure is not optimal, consider addition of low-dose ARB.  Medication Adjustments/Labs and Tests Ordered: Current medicines are reviewed at length with the patient today.  Concerns regarding medicines are outlined above.   Tests Ordered: No orders of the defined types were placed in this encounter.   Medication Changes: No orders of the defined types were placed in this encounter.   Disposition:   Follow up 6 months.  Signed, Jonelle Sidle, MD, [facility] 11/10/2019 11:17 AM    [facility] at [facility] [address] [place], [place], [place] [address] Phone: [phone]; Fax: [phone]

## 2019-12-02 DIAGNOSIS — I129 Hypertensive chronic kidney disease with stage 1 through stage 4 chronic kidney disease, or unspecified chronic kidney disease: Secondary | ICD-10-CM | POA: Diagnosis not present

## 2019-12-02 DIAGNOSIS — I10 Essential (primary) hypertension: Secondary | ICD-10-CM | POA: Diagnosis not present

## 2019-12-02 DIAGNOSIS — E782 Mixed hyperlipidemia: Secondary | ICD-10-CM | POA: Diagnosis not present

## 2019-12-02 DIAGNOSIS — N182 Chronic kidney disease, stage 2 (mild): Secondary | ICD-10-CM | POA: Diagnosis not present

## 2019-12-02 DIAGNOSIS — E559 Vitamin D deficiency, unspecified: Secondary | ICD-10-CM | POA: Diagnosis not present

## 2019-12-25 DIAGNOSIS — R7301 Impaired fasting glucose: Secondary | ICD-10-CM | POA: Diagnosis not present

## 2019-12-25 DIAGNOSIS — Z0000 Encounter for general adult medical examination without abnormal findings: Secondary | ICD-10-CM | POA: Diagnosis not present

## 2019-12-25 DIAGNOSIS — Z136 Encounter for screening for cardiovascular disorders: Secondary | ICD-10-CM | POA: Diagnosis not present

## 2019-12-25 DIAGNOSIS — I2102 ST elevation (STEMI) myocardial infarction involving left anterior descending coronary artery: Secondary | ICD-10-CM | POA: Diagnosis not present

## 2019-12-25 DIAGNOSIS — I129 Hypertensive chronic kidney disease with stage 1 through stage 4 chronic kidney disease, or unspecified chronic kidney disease: Secondary | ICD-10-CM | POA: Diagnosis not present

## 2019-12-25 DIAGNOSIS — R55 Syncope and collapse: Secondary | ICD-10-CM | POA: Diagnosis not present

## 2019-12-29 DIAGNOSIS — I10 Essential (primary) hypertension: Secondary | ICD-10-CM | POA: Diagnosis not present

## 2019-12-29 DIAGNOSIS — M1712 Unilateral primary osteoarthritis, left knee: Secondary | ICD-10-CM | POA: Diagnosis not present

## 2019-12-29 DIAGNOSIS — E559 Vitamin D deficiency, unspecified: Secondary | ICD-10-CM | POA: Diagnosis not present

## 2019-12-29 DIAGNOSIS — N182 Chronic kidney disease, stage 2 (mild): Secondary | ICD-10-CM | POA: Diagnosis not present

## 2019-12-29 DIAGNOSIS — R7301 Impaired fasting glucose: Secondary | ICD-10-CM | POA: Diagnosis not present

## 2020-02-07 DIAGNOSIS — I10 Essential (primary) hypertension: Secondary | ICD-10-CM | POA: Diagnosis not present

## 2020-02-07 DIAGNOSIS — I2510 Atherosclerotic heart disease of native coronary artery without angina pectoris: Secondary | ICD-10-CM | POA: Diagnosis not present

## 2020-02-07 DIAGNOSIS — E782 Mixed hyperlipidemia: Secondary | ICD-10-CM | POA: Diagnosis not present

## 2020-03-08 DIAGNOSIS — I2510 Atherosclerotic heart disease of native coronary artery without angina pectoris: Secondary | ICD-10-CM | POA: Diagnosis not present

## 2020-03-08 DIAGNOSIS — E782 Mixed hyperlipidemia: Secondary | ICD-10-CM | POA: Diagnosis not present

## 2020-03-08 DIAGNOSIS — I10 Essential (primary) hypertension: Secondary | ICD-10-CM | POA: Diagnosis not present

## 2020-04-07 DIAGNOSIS — I10 Essential (primary) hypertension: Secondary | ICD-10-CM | POA: Diagnosis not present

## 2020-04-07 DIAGNOSIS — I2510 Atherosclerotic heart disease of native coronary artery without angina pectoris: Secondary | ICD-10-CM | POA: Diagnosis not present

## 2020-04-07 DIAGNOSIS — E782 Mixed hyperlipidemia: Secondary | ICD-10-CM | POA: Diagnosis not present

## 2020-04-13 DIAGNOSIS — M1712 Unilateral primary osteoarthritis, left knee: Secondary | ICD-10-CM | POA: Diagnosis not present

## 2020-04-20 DIAGNOSIS — M1712 Unilateral primary osteoarthritis, left knee: Secondary | ICD-10-CM | POA: Diagnosis not present

## 2020-05-09 DIAGNOSIS — M1990 Unspecified osteoarthritis, unspecified site: Secondary | ICD-10-CM | POA: Diagnosis not present

## 2020-05-14 ENCOUNTER — Ambulatory Visit: Payer: Medicare Other | Admitting: Cardiology

## 2020-05-20 NOTE — Progress Notes (Signed)
Cardiology Office Note  Date: 05/21/2020   ID: ALEC MCPHEE, DOB 06-18-1940, MRN [id number]  PCP:  Benita Stabile, MD  Cardiologist:  Nona Dell, MD Electrophysiologist:  None   Chief Complaint: 24-month follow-up  History of Present Illness: Theresa Norman is a 80 y.o. female with a history of CAD, HTN, HLD, NSTEMI   Last seen by Dr. Diona Browner on 11/10/2019 for routine visit.  She did not report any active angina, dizziness or syncope.  She had follow-up lab work with her primary care provider and LFTs had normalized at that time she was continuing on low-dose Crestor.  Blood pressure was elevated.  She was asked to check her blood pressures in the preceding weeks leading up to her follow-up with Dr. Margo Aye in December and may need additional therapy.  Her CAD was clinically stable without active angina.  She was continuing aspirin, Plavix, Toprol-XL and Crestor.  Plan was for her to check her blood pressures in the 2 weeks preceding her visit with Dr. Margo Aye.  Blood pressure was not optimal, consider addition of low-dose ARB.   She is here today for 76-month follow-up.  She denies any acute illnesses or hospitalizations in the interim since last visit.  Blood pressure is slightly up but states her blood pressures at home are usually much better.  She states yesterday her blood pressure was in the 120s over 60s.  States is usually in the 120s over 70s.  She denies any anginal or exertional symptoms, lightheadedness, dizziness, near syncopal or syncopal episodes.  Denies any SOB or DOE.  Denies any PND or orthopnea.  No bleeding issues.  No claudication-like symptoms, DVT or PE-like symptoms.  No lower extremity edema.  She states her recent lab work from her primary care provider was normal.   Past Medical History:  Diagnosis Date  . Anxiety   . Arthritis   . CAD (coronary artery disease), native coronary artery    DES to proximal LAD January 2021  . Essential hypertension   . Immune  deficiency disorder (HCC)   . Mixed hyperlipidemia   . NSTEMI (non-ST elevated myocardial infarction) [facility])    January 2021    Past Surgical History:  Procedure Laterality Date  . CATARACT EXTRACTION W/PHACO Right 11/07/2016   Procedure: CATARACT EXTRACTION PHACO AND INTRAOCULAR LENS PLACEMENT RIGHT EYE;  Surgeon: Gemma Payor, MD;  Location: [facility];  Service: Ophthalmology;  Laterality: Right;  CDE: 13.30  . CATARACT EXTRACTION W/PHACO Left 11/20/2016   Procedure: CATARACT EXTRACTION PHACO AND INTRAOCULAR LENS PLACEMENT (IOC);  Surgeon: Gemma Payor, MD;  Location: [facility];  Service: Ophthalmology;  Laterality: Left;  CDE: 20.00  . CORONARY STENT INTERVENTION N/A 01/29/2019   Procedure: CORONARY STENT INTERVENTION;  Surgeon: Iran Ouch, MD;  [facility];  Service: Cardiovascular;  Laterality: N/A;  . INTRAVASCULAR PRESSURE WIRE/FFR STUDY N/A 01/29/2019   Procedure: INTRAVASCULAR PRESSURE WIRE/FFR STUDY;  Surgeon: Iran Ouch, MD;  Location: [facility];  Service: Cardiovascular;  Laterality: N/A;  . LEFT HEART CATH AND CORONARY ANGIOGRAPHY N/A 01/29/2019   Procedure: LEFT HEART CATH AND CORONARY ANGIOGRAPHY;  Surgeon: Iran Ouch, MD;  [facility];  Service: Cardiovascular;  Laterality: N/A;    Current Outpatient Medications  Medication Sig Dispense Refill  . Cholecalciferol (VITAMIN D3 PO) Take 1 tablet by mouth daily.    . clopidogrel (PLAVIX) 75 MG tablet Take 1 tablet by mouth daily.    Marland Kitchen  metoprolol succinate (TOPROL-XL) 25 MG 24 hr tablet Take 1 tablet (25 mg total) by mouth daily. 90 tablet 3  . Multiple Vitamin (MULTIVITAMIN) tablet Take 1 tablet by mouth daily.    . nitroGLYCERIN (NITROSTAT) 0.4 MG SL tablet Place 1 tablet (0.4 mg total) under the tongue every 5 (five) minutes x 3 doses as needed for chest pain. 10 tablet 0  . rosuvastatin (CRESTOR) 5 MG tablet Take 5 mg by mouth daily.     No current facility-administered  medications for this visit.   Allergies:  Patient has no known allergies.   Social History: The patient  reports that she has never smoked. She has never used smokeless tobacco. She reports that she does not drink alcohol and does not use drugs.   Family History: The patient's family history includes Hypertension in her father.   ROS:  Please see the history of present illness. Otherwise, complete review of systems is positive for none.  All other systems are reviewed and negative.   Physical Exam: VS:  BP 134/88   Pulse 64   Ht 5\' 1"  (1.549 m)   Wt 126 lb (57.2 kg)   SpO2 98%   BMI 23.81 kg/m , BMI Body mass index is 23.81 kg/m.  Wt Readings from Last 3 Encounters:  05/21/20 126 lb (57.2 kg)  11/10/19 122 lb (55.3 kg)  10/02/19 125 lb 9.6 oz (57 kg)    General: Patient appears comfortable at rest. Neck: Supple, no elevated JVP or carotid bruits, no thyromegaly. Lungs: Clear to auscultation, nonlabored breathing at rest. Cardiac: Regular rate and rhythm, no S3 or significant systolic murmur, no pericardial rub. Extremities: No pitting edema, distal pulses 2+. Skin: Warm and dry. Musculoskeletal: No kyphosis. Neuropsychiatric: Alert and oriented x3, affect grossly appropriate.  ECG:  EKG February 06, 2019 normal sinus rhythm rate of 83, LAFB, voltage criteria for LVH, nonspecific T wave abnormality, prolonged QT.  Recent Labwork: No results found for requested labs within last 8760 hours.     Component Value Date/Time   CHOL 158 01/28/2019 2145   TRIG 104 01/28/2019 2145   HDL 57 01/28/2019 2145   CHOLHDL 2.8 01/28/2019 2145   VLDL 21 01/28/2019 2145   LDLCALC 80 01/28/2019 2145    Other Studies Reviewed Today:   Cardiac catheterization 01/29/2019:  The left ventricular systolic function is normal.  LV end diastolic pressure is normal.  The left ventricular ejection fraction is 55-65% by visual estimate.  3rd RPL lesion is 90% stenosed.  Prox LAD lesion is  70% stenosed.  Post intervention, there is a 0% residual stenosis.  A drug-eluting stent was successfully placed using a STENT RESOLUTE ONYX 2.5X15.  1. Significant one-vessel coronary artery disease. There is 70% stenosis in the proximal LAD which was significant by DFR at 0.88. There is also significant stenosis in a small RPL3 supplying a small territory and thus this is unlikely to be the culprit. 2. Normal LV systolic function and left ventricular end-diastolic pressure. 3. Successful angioplasty and drug-eluting stent placement to the proximal LAD.  Recommendations: Dual antiplatelet therapy for at least 12 months. Aggressive treatment of risk factors. Likely discharge home tomorrow.  Echocardiogram 01/29/2019: 1. Left ventricular ejection fraction, by visual estimation, is 55 to  60%. The left ventricle has normal function. There is moderately increased  left ventricular hypertrophy.  2. The left ventricle has no regional wall motion abnormalities.  3. Global right ventricle has normal systolic function.The right  ventricular size is  normal. No increase in right ventricular wall  thickness.  4. Left atrial size was normal.  5. Right atrial size was normal.  6. The mitral valve is normal in structure. No evidence of mitral valve  regurgitation. No evidence of mitral stenosis.  7. The tricuspid valve is normal in structure.  8. The tricuspid valve is normal in structure. Tricuspid valve  regurgitation is not demonstrated.  9. The aortic valve is normal in structure. Aortic valve regurgitation is  not visualized. Mild aortic valve sclerosis without stenosis.  10. Pulmonic regurgitation is mild.  11. The pulmonic valve was normal in structure. Pulmonic valve  regurgitation is mild.  12. The inferior vena cava is normal in size with greater than 50%  respiratory variability, suggesting right atrial pressure of 3 mmHg.  13. The average left ventricular global  longitudinal strain is -9.7 %.  Assessment and Plan:  1. CAD in native artery   2. Mixed hyperlipidemia   3. Essential hypertension    1. CAD in native artery Denies any anginal or exertional symptoms.  Continue Plavix 75 mg daily.  Continue sublingual nitroglycerin as needed.  Please refill nitroglycerin.  2. Mixed hyperlipidemia Continue Crestor 5 mg daily.  3. Essential hypertension Continue Toprol-XL 25 mg daily.  Blood pressure slightly elevated today but states her blood pressure at home is usually in the 120s over 70s.  Medication Adjustments/Labs and Tests Ordered: Current medicines are reviewed at length with the patient today.  Concerns regarding medicines are outlined above.   Disposition: Follow-up with Dr. Diona Browner or APP 6 months  Signed, Rennis Harding, NP 05/21/2020 10:43 AM    [facility] at [facility] [address] [place], [place], [place] [address] Phone: [phone]; Fax: [phone]

## 2020-05-21 ENCOUNTER — Ambulatory Visit: Payer: Medicare Other | Admitting: Family Medicine

## 2020-05-21 ENCOUNTER — Other Ambulatory Visit: Payer: Self-pay

## 2020-05-21 ENCOUNTER — Encounter: Payer: Self-pay | Admitting: Family Medicine

## 2020-05-21 VITALS — BP 134/88 | HR 64 | Ht 61.0 in | Wt 126.0 lb

## 2020-05-21 DIAGNOSIS — I2510 Atherosclerotic heart disease of native coronary artery without angina pectoris: Secondary | ICD-10-CM | POA: Diagnosis not present

## 2020-05-21 DIAGNOSIS — I10 Essential (primary) hypertension: Secondary | ICD-10-CM

## 2020-05-21 DIAGNOSIS — E782 Mixed hyperlipidemia: Secondary | ICD-10-CM

## 2020-05-21 MED ORDER — NITROGLYCERIN 0.4 MG SL SUBL: 0.4000 mg | SUBLINGUAL_TABLET | SUBLINGUAL | 3 refills | Status: DC | PRN

## 2020-05-21 NOTE — Addendum Note (Signed)
Addended by: Lesle Chris on: 05/21/2020 10:49 AM   Modules accepted: Orders

## 2020-05-21 NOTE — Patient Instructions (Signed)
Medication Instructions:  Continue all current medications.   Labwork: none  Testing/Procedures: none  Follow-Up: 6 months   Any Other Special Instructions Will Be Listed Below (If Applicable).   If you need a refill on your cardiac medications before your next appointment, please call your pharmacy.  

## 2020-06-30 DIAGNOSIS — R7301 Impaired fasting glucose: Secondary | ICD-10-CM | POA: Diagnosis not present

## 2020-06-30 DIAGNOSIS — I10 Essential (primary) hypertension: Secondary | ICD-10-CM | POA: Diagnosis not present

## 2020-07-05 DIAGNOSIS — N182 Chronic kidney disease, stage 2 (mild): Secondary | ICD-10-CM | POA: Diagnosis not present

## 2020-07-05 DIAGNOSIS — E559 Vitamin D deficiency, unspecified: Secondary | ICD-10-CM | POA: Diagnosis not present

## 2020-07-05 DIAGNOSIS — R946 Abnormal results of thyroid function studies: Secondary | ICD-10-CM | POA: Diagnosis not present

## 2020-07-05 DIAGNOSIS — E782 Mixed hyperlipidemia: Secondary | ICD-10-CM | POA: Diagnosis not present

## 2020-07-05 DIAGNOSIS — R7301 Impaired fasting glucose: Secondary | ICD-10-CM | POA: Diagnosis not present

## 2020-07-05 DIAGNOSIS — K5909 Other constipation: Secondary | ICD-10-CM | POA: Diagnosis not present

## 2020-07-05 DIAGNOSIS — M25562 Pain in left knee: Secondary | ICD-10-CM | POA: Diagnosis not present

## 2020-07-05 DIAGNOSIS — I10 Essential (primary) hypertension: Secondary | ICD-10-CM | POA: Diagnosis not present

## 2020-07-05 DIAGNOSIS — I2510 Atherosclerotic heart disease of native coronary artery without angina pectoris: Secondary | ICD-10-CM | POA: Diagnosis not present

## 2020-07-05 DIAGNOSIS — R7401 Elevation of levels of liver transaminase levels: Secondary | ICD-10-CM | POA: Diagnosis not present

## 2020-07-08 DIAGNOSIS — M1990 Unspecified osteoarthritis, unspecified site: Secondary | ICD-10-CM | POA: Diagnosis not present

## 2020-07-27 DIAGNOSIS — H6502 Acute serous otitis media, left ear: Secondary | ICD-10-CM | POA: Diagnosis not present

## 2020-09-08 DIAGNOSIS — M1990 Unspecified osteoarthritis, unspecified site: Secondary | ICD-10-CM | POA: Diagnosis not present

## 2020-09-28 DIAGNOSIS — Z0001 Encounter for general adult medical examination with abnormal findings: Secondary | ICD-10-CM | POA: Diagnosis not present

## 2020-10-08 DIAGNOSIS — M1990 Unspecified osteoarthritis, unspecified site: Secondary | ICD-10-CM | POA: Diagnosis not present

## 2020-11-08 DIAGNOSIS — M1990 Unspecified osteoarthritis, unspecified site: Secondary | ICD-10-CM | POA: Diagnosis not present

## 2020-12-01 ENCOUNTER — Ambulatory Visit: Payer: Medicare Other | Admitting: Cardiology

## 2020-12-01 ENCOUNTER — Encounter: Payer: Self-pay | Admitting: Cardiology

## 2020-12-01 VITALS — BP 134/82 | HR 67 | Ht 61.0 in | Wt 130.6 lb

## 2020-12-01 DIAGNOSIS — E782 Mixed hyperlipidemia: Secondary | ICD-10-CM

## 2020-12-01 DIAGNOSIS — I25119 Atherosclerotic heart disease of native coronary artery with unspecified angina pectoris: Secondary | ICD-10-CM | POA: Diagnosis not present

## 2020-12-01 MED ORDER — ASPIRIN EC 81 MG PO TBEC: 81.0000 mg | DELAYED_RELEASE_TABLET | Freq: Every day | ORAL | 3 refills | Status: DC

## 2020-12-01 NOTE — Patient Instructions (Addendum)
Medication Instructions:  Your physician has recommended you make the following change in your medication:  Stop clopidogrel Start aspirin 81 mg daily Continue other medications the same  Labwork: none  Testing/Procedures: none  Follow-Up: Your physician recommends that you schedule a follow-up appointment in: 6 months  Any Other Special Instructions Will Be Listed Below (If Applicable).  If you need a refill on your cardiac medications before your next appointment, please call your pharmacy.

## 2020-12-01 NOTE — Progress Notes (Signed)
Cardiology Office Note  Date: 12/01/2020   ID: Theresa, Norman [birth year], MRN [id number]  PCP:  Benita Stabile, MD  Cardiologist:  Nona Dell, MD Electrophysiologist:  None   Chief Complaint  Patient presents with   Cardiac follow-up    History of Present Illness: Theresa Norman is an 80 y.o. female last seen in May by Mr. Theresa Norman.  She is here for a routine visit.  Reports doing well overall, no angina symptoms or nitroglycerin use.  She walks 30 minutes twice a day.  Reports NYHA class II dyspnea.  I reviewed her medications which are noted below.  We discussed stopping Plavix and replacing it with aspirin 81 mg daily.  She does have more bruising on Plavix.  I personally reviewed her ECG which shows sinus rhythm with leftward axis.  She is due for follow-up lab work with Dr. Margo Aye in December.  Past Medical History:  Diagnosis Date   Anxiety    Arthritis    CAD (coronary artery disease), native coronary artery    DES to proximal LAD January 2021   Essential hypertension    Immune deficiency disorder [facility])    Mixed hyperlipidemia    NSTEMI (non-ST elevated myocardial infarction) [facility])    January 2021    Past Surgical History:  Procedure Laterality Date   CATARACT EXTRACTION W/PHACO Right 11/07/2016   Procedure: CATARACT EXTRACTION PHACO AND INTRAOCULAR LENS PLACEMENT RIGHT EYE;  Surgeon: Theresa Payor, MD;  Location: [facility];  Service: Ophthalmology;  Laterality: Right;  CDE: 13.30   CATARACT EXTRACTION W/PHACO Left 11/20/2016   Procedure: CATARACT EXTRACTION PHACO AND INTRAOCULAR LENS PLACEMENT (IOC);  Surgeon: Theresa Payor, MD;  Location: [facility];  Service: Ophthalmology;  Laterality: Left;  CDE: 20.00   CORONARY STENT INTERVENTION N/A 01/29/2019   Procedure: CORONARY STENT INTERVENTION;  Surgeon: Theresa Ouch, MD;  [facility]: [facility];  Service: Cardiovascular;  Laterality: N/A;   INTRAVASCULAR PRESSURE WIRE/FFR STUDY N/A 01/29/2019   Procedure:  INTRAVASCULAR PRESSURE WIRE/FFR STUDY;  Surgeon: Theresa Ouch, MD;  [facility]: [facility];  Service: Cardiovascular;  Laterality: N/A;   LEFT HEART CATH AND CORONARY ANGIOGRAPHY N/A 01/29/2019   Procedure: LEFT HEART CATH AND CORONARY ANGIOGRAPHY;  Surgeon: Theresa Ouch, MD;  [facility]: [facility];  Service: Cardiovascular;  Laterality: N/A;    Current Outpatient Medications  Medication Sig Dispense Refill   aspirin EC 81 MG tablet Take 1 tablet (81 mg total) by mouth daily. Swallow whole. 90 tablet 3   Cholecalciferol (VITAMIN D3 PO) Take 1 tablet by mouth daily.     metoprolol succinate (TOPROL-XL) 25 MG 24 hr tablet Take 1 tablet (25 mg total) by mouth daily. 90 tablet 3   Multiple Vitamin (MULTIVITAMIN) tablet Take 1 tablet by mouth daily.     nitroGLYCERIN (NITROSTAT) 0.4 MG SL tablet Place 1 tablet (0.4 mg total) under the tongue every 5 (five) minutes x 3 doses as needed for chest pain. 25 tablet 3   rosuvastatin (CRESTOR) 5 MG tablet Take 5 mg by mouth daily.     No current facility-administered medications for this visit.   Allergies:  Patient has no known allergies.   ROS: No palpitations or syncope.  Arthritic knee pain.  Physical Exam: VS:  BP 134/82   Pulse 67   Ht 5\' 1"  (1.549 m)   Wt 130 lb 9.6 oz (59.2 kg)   SpO2 98%   BMI 24.68 kg/m ,  BMI Body mass index is 24.68 kg/m.  Wt Readings from Last 3 Encounters:  12/01/20 130 lb 9.6 oz (59.2 kg)  05/21/20 126 lb (57.2 kg)  11/10/19 122 lb (55.3 kg)    General: Patient appears comfortable at rest. HEENT: Conjunctiva and lids normal, wearing a mask. Neck: Supple, no elevated JVP or carotid bruits, no thyromegaly. Lungs: Clear to auscultation, nonlabored breathing at rest. Cardiac: Regular rate and rhythm, no S3, 1/6 systolic murmur. Extremities: No pitting edema.  ECG:  An ECG dated 02/06/2019 was personally reviewed today and demonstrated:  Sinus rhythm with LVH, leftward axis, nonspecific T  wave changes.  Recent Labwork:    Component Value Date/Time   CHOL 158 01/28/2019 2145   TRIG 104 01/28/2019 2145   HDL 57 01/28/2019 2145   CHOLHDL 2.8 01/28/2019 2145   VLDL 21 01/28/2019 2145   LDLCALC 80 01/28/2019 2145  December 2021: Hemoglobin 13.4, platelets 293, BUN 11, creatinine 0.81, potassium 4.9, AST 17, ALT 13, cholesterol 165, triglycerides 116, HDL 60, LDL 84, hemoglobin A1c 5.2%  Other Studies Reviewed Today:  Cardiac catheterization 01/29/2019: The left ventricular systolic function is normal. LV end diastolic pressure is normal. The left ventricular ejection fraction is 55-65% by visual estimate. 3rd RPL lesion is 90% stenosed. Prox LAD lesion is 70% stenosed. Post intervention, there is a 0% residual stenosis. A drug-eluting stent was successfully placed using a STENT RESOLUTE ONYX 2.5X15.   1.  Significant one-vessel coronary artery disease.  There is 70% stenosis in the proximal LAD which was significant by DFR at 0.88.  There is also significant stenosis in a small RPL3 supplying a small territory and thus this is unlikely to be the culprit. 2.  Normal LV systolic function and left ventricular end-diastolic pressure. 3.  Successful angioplasty and drug-eluting stent placement to the proximal LAD.   Recommendations: Dual antiplatelet therapy for at least 12 months.  Aggressive treatment of risk factors. Likely discharge home tomorrow.   Echocardiogram 01/29/2019:  1. Left ventricular ejection fraction, by visual estimation, is 55 to  60%. The left ventricle has normal function. There is moderately increased  left ventricular hypertrophy.   2. The left ventricle has no regional wall motion abnormalities.   3. Global right ventricle has normal systolic function.The right  ventricular size is normal. No increase in right ventricular wall  thickness.   4. Left atrial size was normal.   5. Right atrial size was normal.   6. The mitral valve is normal in  structure. No evidence of mitral valve  regurgitation. No evidence of mitral stenosis.   7. The tricuspid valve is normal in structure.   8. The tricuspid valve is normal in structure. Tricuspid valve  regurgitation is not demonstrated.   9. The aortic valve is normal in structure. Aortic valve regurgitation is  not visualized. Mild aortic valve sclerosis without stenosis.  10. Pulmonic regurgitation is mild.  11. The pulmonic valve was normal in structure. Pulmonic valve  regurgitation is mild.  12. The inferior vena cava is normal in size with greater than 50%  respiratory variability, suggesting right atrial pressure of 3 mmHg.  13. The average left ventricular global longitudinal strain is -9.7 %.   Assessment and Plan:  1.  CAD status post DES to the proximal LAD in January 2021.  She is doing well at this time on medical therapy, continue regular walking plan for exercise.  Substitute aspirin 81 mg daily for Plavix and otherwise continue Toprol-XL, and  Crestor.  ECG reviewed.  2.  Mixed hyperlipidemia, continues on Crestor with follow-up lab work pending per Dr. Margo Aye in December.  Medication Adjustments/Labs and Tests Ordered: Current medicines are reviewed at length with the patient today.  Concerns regarding medicines are outlined above.   Tests Ordered: Orders Placed This Encounter  Procedures   EKG 12-Lead    Medication Changes: Meds ordered this encounter  Medications   aspirin EC 81 MG tablet    Sig: Take 1 tablet (81 mg total) by mouth daily. Swallow whole.    Dispense:  90 tablet    Refill:  3    12/01/2020 NEW-stop plavix     Disposition:  Follow up  6 months.  Signed, Jonelle Sidle, MD, [facility] 12/01/2020 11:36 AM    [facility] at [facility] [address] [place], [place], [place] [address] Phone: [phone]; Fax: [phone]

## 2020-12-08 DIAGNOSIS — M1990 Unspecified osteoarthritis, unspecified site: Secondary | ICD-10-CM | POA: Diagnosis not present

## 2020-12-29 DIAGNOSIS — H04123 Dry eye syndrome of bilateral lacrimal glands: Secondary | ICD-10-CM | POA: Diagnosis not present

## 2021-01-05 DIAGNOSIS — E559 Vitamin D deficiency, unspecified: Secondary | ICD-10-CM | POA: Diagnosis not present

## 2021-01-05 DIAGNOSIS — I10 Essential (primary) hypertension: Secondary | ICD-10-CM | POA: Diagnosis not present

## 2021-01-05 DIAGNOSIS — R7301 Impaired fasting glucose: Secondary | ICD-10-CM | POA: Diagnosis not present

## 2021-01-11 ENCOUNTER — Telehealth: Payer: Self-pay | Admitting: *Deleted

## 2021-01-11 DIAGNOSIS — E782 Mixed hyperlipidemia: Secondary | ICD-10-CM | POA: Diagnosis not present

## 2021-01-11 DIAGNOSIS — I10 Essential (primary) hypertension: Secondary | ICD-10-CM | POA: Diagnosis not present

## 2021-01-11 DIAGNOSIS — R946 Abnormal results of thyroid function studies: Secondary | ICD-10-CM | POA: Diagnosis not present

## 2021-01-11 DIAGNOSIS — R7401 Elevation of levels of liver transaminase levels: Secondary | ICD-10-CM | POA: Diagnosis not present

## 2021-01-11 DIAGNOSIS — E559 Vitamin D deficiency, unspecified: Secondary | ICD-10-CM | POA: Diagnosis not present

## 2021-01-11 DIAGNOSIS — I2510 Atherosclerotic heart disease of native coronary artery without angina pectoris: Secondary | ICD-10-CM | POA: Diagnosis not present

## 2021-01-11 DIAGNOSIS — N182 Chronic kidney disease, stage 2 (mild): Secondary | ICD-10-CM | POA: Diagnosis not present

## 2021-01-11 DIAGNOSIS — K5909 Other constipation: Secondary | ICD-10-CM | POA: Diagnosis not present

## 2021-01-11 DIAGNOSIS — M25562 Pain in left knee: Secondary | ICD-10-CM | POA: Diagnosis not present

## 2021-01-11 DIAGNOSIS — R7301 Impaired fasting glucose: Secondary | ICD-10-CM | POA: Diagnosis not present

## 2021-01-11 MED ORDER — ROSUVASTATIN CALCIUM 10 MG PO TABS: 10.0000 mg | ORAL_TABLET | Freq: Every day | ORAL | 6 refills | Status: DC

## 2021-01-11 NOTE — Telephone Encounter (Signed)
Lesle Chris, LPN  02/17/5282  1:32 PM EST Back to Top    Notified, copy to pcp.  Will send new prescription to [facility] now.      Jonelle Sidle, MD  01/11/2021 10:59 AM EST     Results reviewed.  Recent LDL 86.  Would suggest increasing Crestor to 10 mg daily to try and get lipids closer to goal.

## 2021-03-07 ENCOUNTER — Encounter (HOSPITAL_COMMUNITY): Payer: Self-pay | Admitting: *Deleted

## 2021-03-07 ENCOUNTER — Emergency Department (HOSPITAL_COMMUNITY): Payer: Medicare Other

## 2021-03-07 ENCOUNTER — Other Ambulatory Visit: Payer: Self-pay

## 2021-03-07 ENCOUNTER — Observation Stay (HOSPITAL_COMMUNITY)
Admission: EM | Admit: 2021-03-07 | Discharge: 2021-03-09 | Disposition: A | Discharge status: Home / Self Care | Payer: Medicare Other | Attending: Family Medicine | Admitting: Family Medicine

## 2021-03-07 DIAGNOSIS — E7800 Pure hypercholesterolemia, unspecified: Secondary | ICD-10-CM | POA: Diagnosis present

## 2021-03-07 DIAGNOSIS — I4891 Unspecified atrial fibrillation: Secondary | ICD-10-CM | POA: Diagnosis present

## 2021-03-07 DIAGNOSIS — Z79899 Other long term (current) drug therapy: Secondary | ICD-10-CM | POA: Insufficient documentation

## 2021-03-07 DIAGNOSIS — Z28310 Unvaccinated for covid-19: Secondary | ICD-10-CM | POA: Insufficient documentation

## 2021-03-07 DIAGNOSIS — Z20822 Contact with and (suspected) exposure to covid-19: Secondary | ICD-10-CM | POA: Insufficient documentation

## 2021-03-07 DIAGNOSIS — Z7901 Long term (current) use of anticoagulants: Secondary | ICD-10-CM | POA: Insufficient documentation

## 2021-03-07 DIAGNOSIS — Z7982 Long term (current) use of aspirin: Secondary | ICD-10-CM | POA: Insufficient documentation

## 2021-03-07 DIAGNOSIS — I10 Essential (primary) hypertension: Secondary | ICD-10-CM | POA: Diagnosis not present

## 2021-03-07 DIAGNOSIS — G459 Transient cerebral ischemic attack, unspecified: Principal | ICD-10-CM | POA: Diagnosis present

## 2021-03-07 DIAGNOSIS — I639 Cerebral infarction, unspecified: Secondary | ICD-10-CM | POA: Diagnosis not present

## 2021-03-07 DIAGNOSIS — Z8673 Personal history of transient ischemic attack (TIA), and cerebral infarction without residual deficits: Secondary | ICD-10-CM | POA: Diagnosis not present

## 2021-03-07 DIAGNOSIS — I2510 Atherosclerotic heart disease of native coronary artery without angina pectoris: Secondary | ICD-10-CM | POA: Diagnosis present

## 2021-03-07 DIAGNOSIS — Z743 Need for continuous supervision: Secondary | ICD-10-CM | POA: Diagnosis not present

## 2021-03-07 DIAGNOSIS — R6889 Other general symptoms and signs: Secondary | ICD-10-CM | POA: Diagnosis not present

## 2021-03-07 DIAGNOSIS — I619 Nontraumatic intracerebral hemorrhage, unspecified: Secondary | ICD-10-CM | POA: Diagnosis not present

## 2021-03-07 DIAGNOSIS — R410 Disorientation, unspecified: Secondary | ICD-10-CM | POA: Diagnosis not present

## 2021-03-07 DIAGNOSIS — Z955 Presence of coronary angioplasty implant and graft: Secondary | ICD-10-CM | POA: Insufficient documentation

## 2021-03-07 DIAGNOSIS — S0093XA Contusion of unspecified part of head, initial encounter: Secondary | ICD-10-CM | POA: Diagnosis not present

## 2021-03-07 DIAGNOSIS — R9431 Abnormal electrocardiogram [ECG] [EKG]: Secondary | ICD-10-CM | POA: Diagnosis not present

## 2021-03-07 DIAGNOSIS — R2689 Other abnormalities of gait and mobility: Secondary | ICD-10-CM | POA: Insufficient documentation

## 2021-03-07 DIAGNOSIS — S0083XA Contusion of other part of head, initial encounter: Secondary | ICD-10-CM | POA: Diagnosis not present

## 2021-03-07 LAB — CBC WITH DIFFERENTIAL/PLATELET
Abs Immature Granulocytes: 0.01 10*3/uL (ref 0.00–0.07)
Basophils Absolute: 0.1 10*3/uL (ref 0.0–0.1)
Basophils Relative: 1 %
Eosinophils Absolute: 0 10*3/uL (ref 0.0–0.5)
Eosinophils Relative: 1 %
HCT: 49.1 % — ABNORMAL HIGH (ref 36.0–46.0)
Hemoglobin: 15.9 g/dL — ABNORMAL HIGH (ref 12.0–15.0)
Immature Granulocytes: 0 %
Lymphocytes Relative: 31 %
Lymphs Abs: 2.2 10*3/uL (ref 0.7–4.0)
MCH: 30.9 pg (ref 26.0–34.0)
MCHC: 32.4 g/dL (ref 30.0–36.0)
MCV: 95.3 fL (ref 80.0–100.0)
Monocytes Absolute: 0.6 10*3/uL (ref 0.1–1.0)
Monocytes Relative: 8 %
Neutro Abs: 4.2 10*3/uL (ref 1.7–7.7)
Neutrophils Relative %: 59 %
Platelets: 223 10*3/uL (ref 150–400)
RBC: 5.15 MIL/uL — ABNORMAL HIGH (ref 3.87–5.11)
RDW: 12.9 % (ref 11.5–15.5)
WBC: 7 10*3/uL (ref 4.0–10.5)
nRBC: 0 % (ref 0.0–0.2)

## 2021-03-07 LAB — BASIC METABOLIC PANEL
Anion gap: 13 (ref 5–15)
BUN: 21 mg/dL (ref 8–23)
CO2: 25 mmol/L (ref 22–32)
Calcium: 9.7 mg/dL (ref 8.9–10.3)
Chloride: 103 mmol/L (ref 98–111)
Creatinine, Ser: 0.9 mg/dL (ref 0.44–1.00)
GFR, Estimated: >60 mL/min (ref >60)
Glucose, Bld: 102 mg/dL — ABNORMAL HIGH (ref 70–99)
Potassium: 4 mmol/L (ref 3.5–5.1)
Sodium: 141 mmol/L (ref 135–145)

## 2021-03-07 LAB — URINALYSIS, ROUTINE W REFLEX MICROSCOPIC
Bilirubin Urine: NEGATIVE
Glucose, UA: NEGATIVE mg/dL
Hgb urine dipstick: NEGATIVE
Ketones, ur: 20 mg/dL — AB
Leukocytes,Ua: NEGATIVE
Nitrite: NEGATIVE
Protein, ur: NEGATIVE mg/dL
Specific Gravity, Urine: 1.009 (ref 1.005–1.030)
pH: 7 (ref 5.0–8.0)

## 2021-03-07 LAB — CBG MONITORING, ED: Glucose-Capillary: 80 mg/dL (ref 70–99)

## 2021-03-07 LAB — RESP PANEL BY RT-PCR (FLU A&B, COVID) ARPGX2
Influenza A by PCR: NEGATIVE
Influenza B by PCR: NEGATIVE
SARS Coronavirus 2 by RT PCR: NEGATIVE

## 2021-03-07 IMAGING — MR MR HEAD W/O CM
11 of 12 series · 40 of 48 positions shown · non-contrast
Comparison: None.

CLINICAL DATA: Transient ischemic attack

EXAM:
MRI HEAD WITHOUT CONTRAST
TECHNIQUE: Multiplanar, multiecho pulse sequences of the brain and surrounding
structures were obtained without intravenous contrast.

[Series 5: DWI · axial · 4.0mm · 0.88mm/px · z∈[-62,+75]mm · 4 of 36 slices shown (1 of 6)]
[im 1/36]
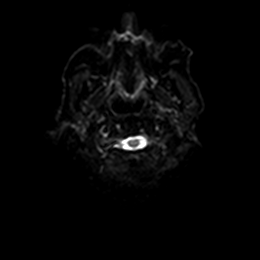
[im 12/36]
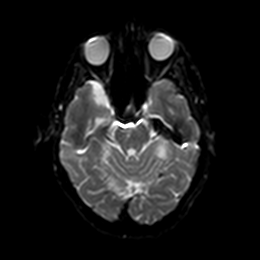
[im 24/36]
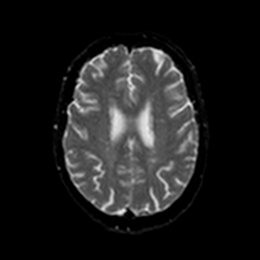
[im 36/36]
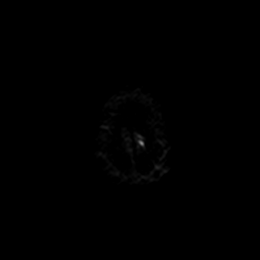

[Series 5: DWI · axial · 4.0mm · 0.88mm/px · z∈[-62,+75]mm · 4 of 36 slices shown (2 of 6)]
[im 1/36]
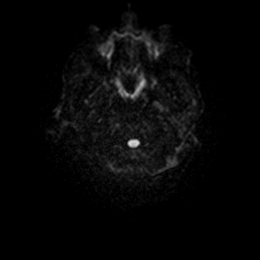
[im 12/36]
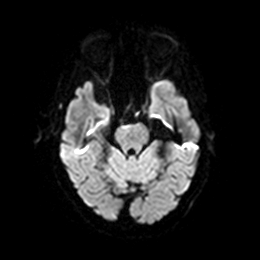
[im 24/36]
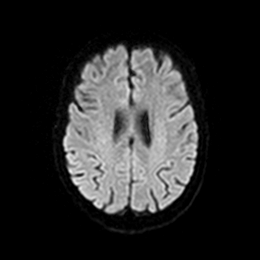
[im 36/36]
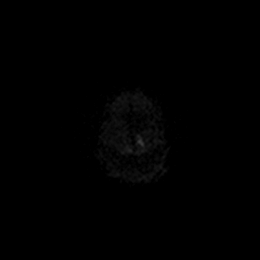

[Series 6: DWI · axial · 4.0mm · 0.88mm/px · z∈[-62,+75]mm · 4 of 36 slices shown (3 of 6)]
[im 1/36]
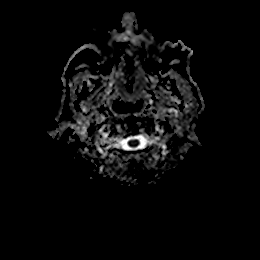
[im 12/36]
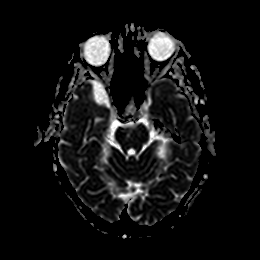
[im 24/36]
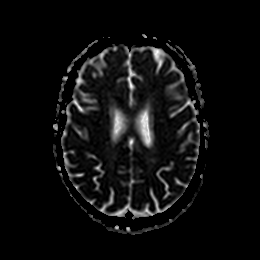
[im 36/36]
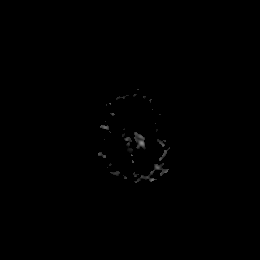

[Series 7: DWI · coronal · 5.0mm · 0.88mm/px · 4 of 28 slices shown (4 of 6)]
[im 1/28]
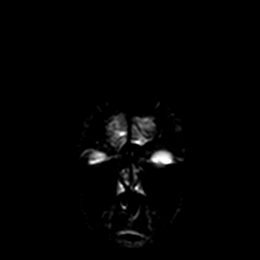
[im 10/28]
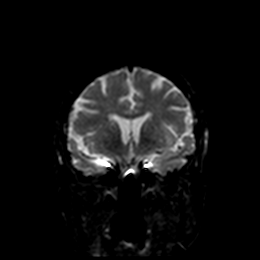
[im 19/28]
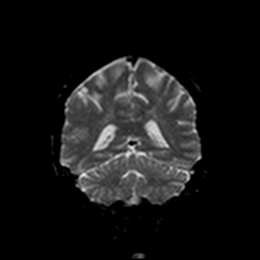
[im 28/28]
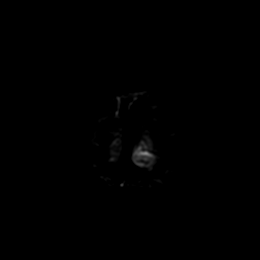

[Series 7: DWI · coronal · 5.0mm · 0.88mm/px · 4 of 28 slices shown (5 of 6)]
[im 1/28]
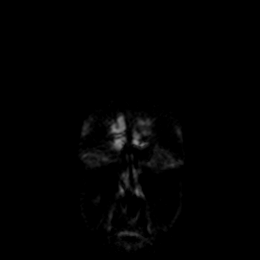
[im 10/28]
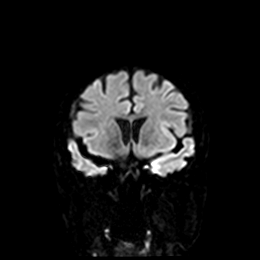
[im 19/28]
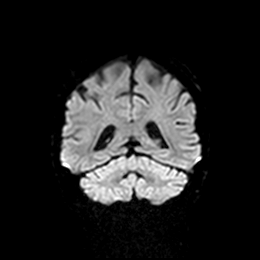
[im 28/28]
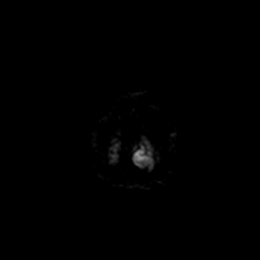

[Series 8: DWI · coronal · 5.0mm · 0.88mm/px · 4 of 28 slices shown (6 of 6)]
[im 1/28]
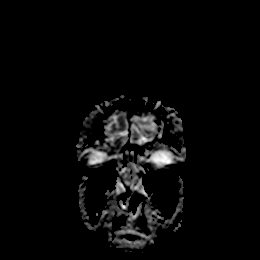
[im 10/28]
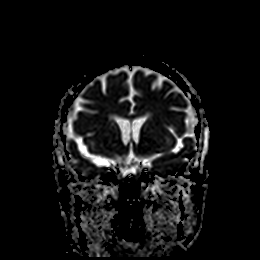
[im 19/28]
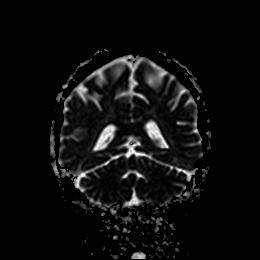
[im 28/28]
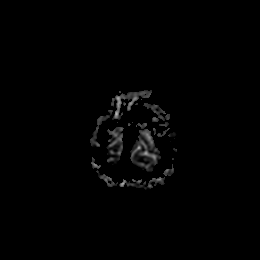

[Series 9: T1 · sagittal · 5.0mm · 0.94mm/px · 3 of 21 slices shown]
[im 1/21]
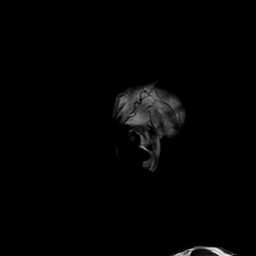
[im 11/21]
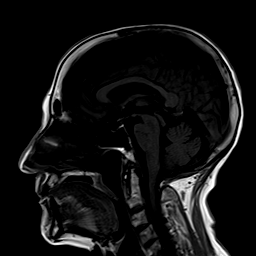
[im 21/21]
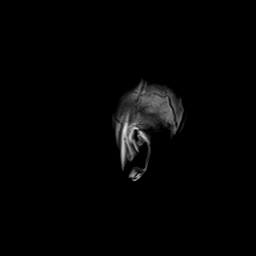

[Series 10: T2 · axial · 5.0mm · 0.72mm/px · z∈[-59,+72]mm · 3 of 20 slices shown (1 of 2)]
[im 1/20]
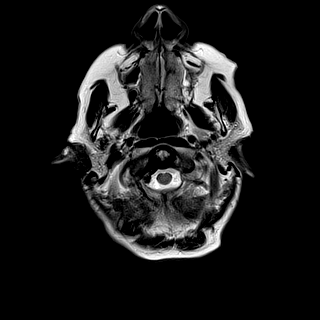
[im 10/20]
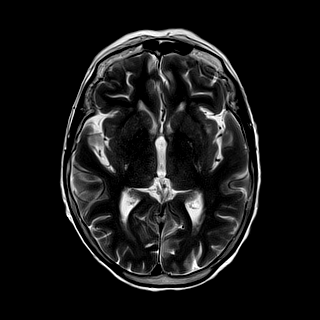
[im 20/20]
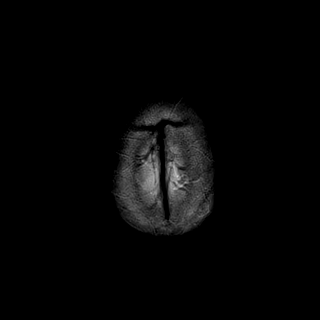

[Series 11: ax hemo · axial · 5.0mm · 0.86mm/px · z∈[-64,+77]mm · 3 of 25 slices shown]
[im 1/25]
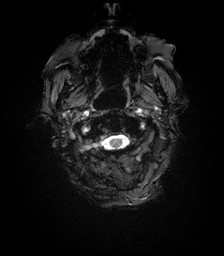
[im 13/25]
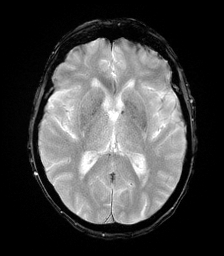
[im 25/25]
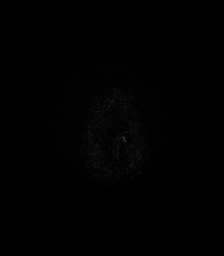

[Series 12: FLAIR · axial · 4.0mm · 0.43mm/px · z∈[-54,+67]mm · 4 of 32 slices shown]
[im 1/32]
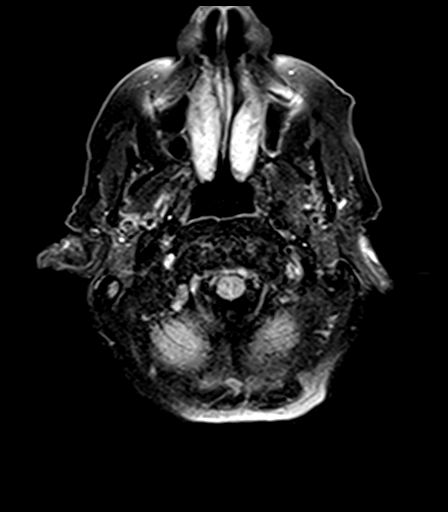
[im 11/32]
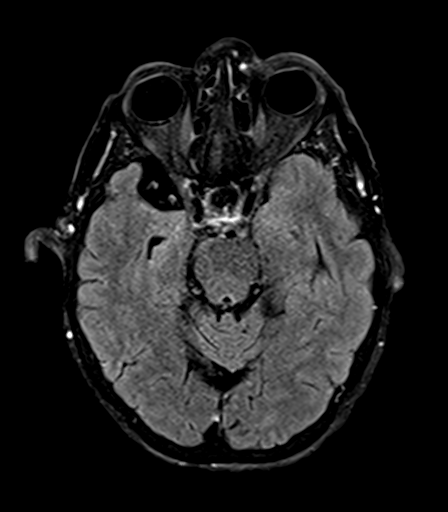
[im 21/32]
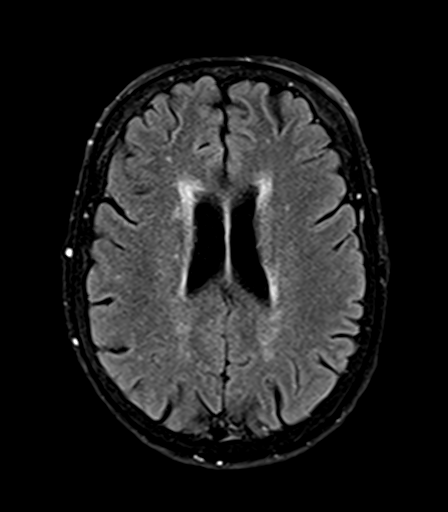
[im 32/32]
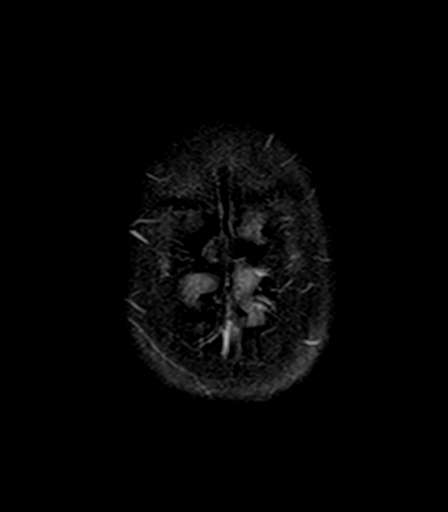

[Series 14: T2 · coronal · 5.0mm · 0.72mm/px · 3 of 24 slices shown (2 of 2)]
[im 1/24]
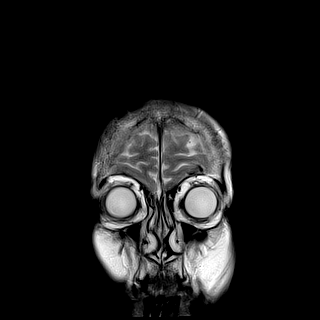
[im 12/24]
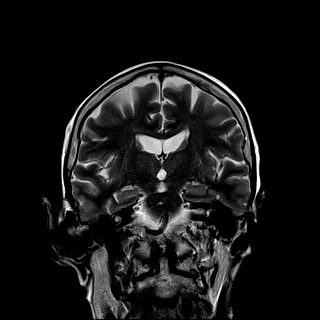
[im 24/24]
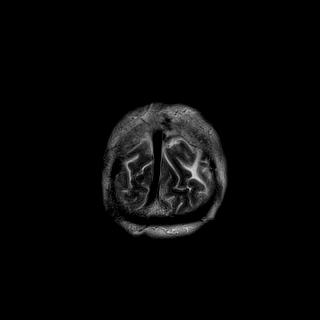

[40 of 48 positions shown; findings below may reference images not displayed]

FINDINGS: Brain: No acute infarct, mass effect or extra-axial collection. No
acute or chronic hemorrhage. There is multifocal hyperintense
T2-weighted signal within the white matter. Generalized volume loss
without a clear lobar predilection. The midline structures are
normal.

Vascular: Major flow voids are preserved.

Skull and upper cervical spine: Normal calvarium and skull base.
Visualized upper cervical spine and soft tissues are normal.

Sinuses/Orbits:No paranasal sinus fluid levels or advanced mucosal
thickening. No mastoid or middle ear effusion. Normal orbits.
IMPRESSION: 1. No acute intracranial abnormality.
2. Findings of chronic small vessel ischemia and generalized volume
loss.

## 2021-03-07 IMAGING — CT CT HEAD W/O CM
3 of 4 series · 16 of 47 positions shown, 19 images · non-contrast
Comparison: CT head [DATE]

CLINICAL DATA: TIA, contusion



[Series 2: head w o · axial · 0.41mm/px · z∈[-607,-487]mm · 10 of 30 slices shown, 13 images]
[im 3/30  brain]
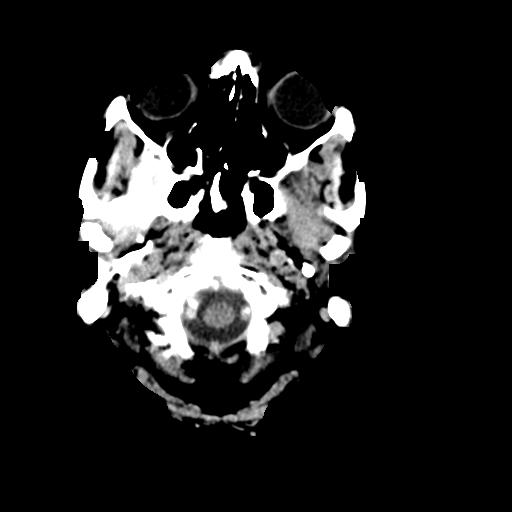
[im 3/30  bone]
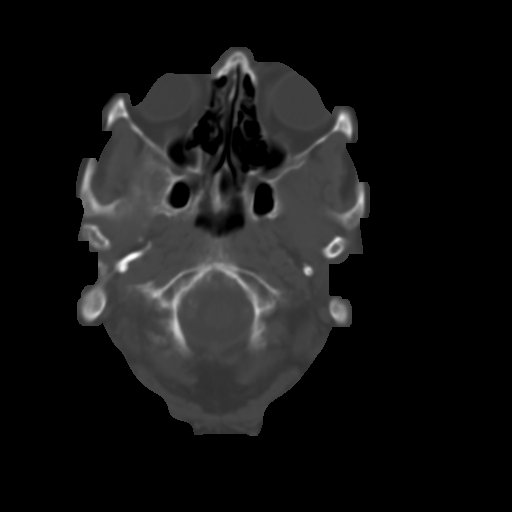
[im 5/30  brain]
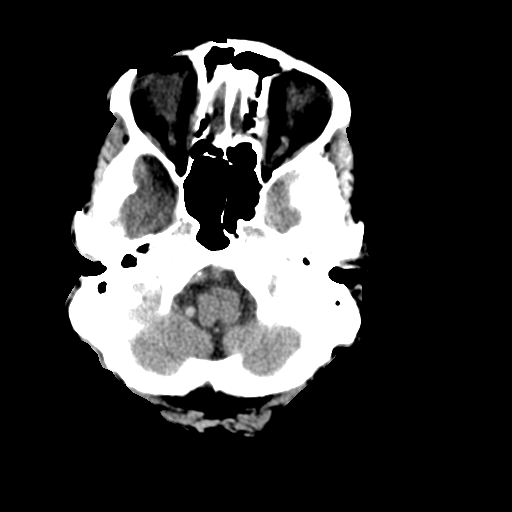
[im 9/30  brain]
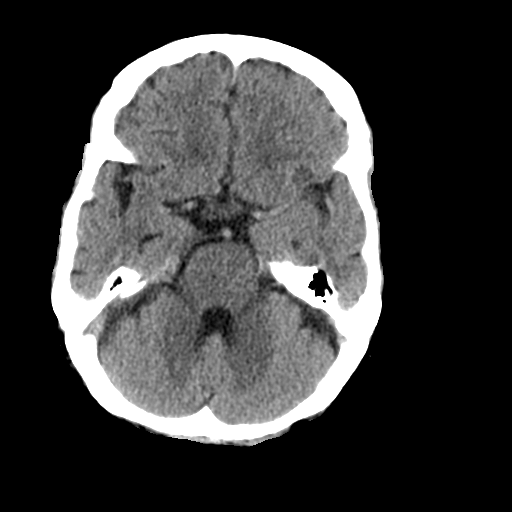
[im 11/30  brain]
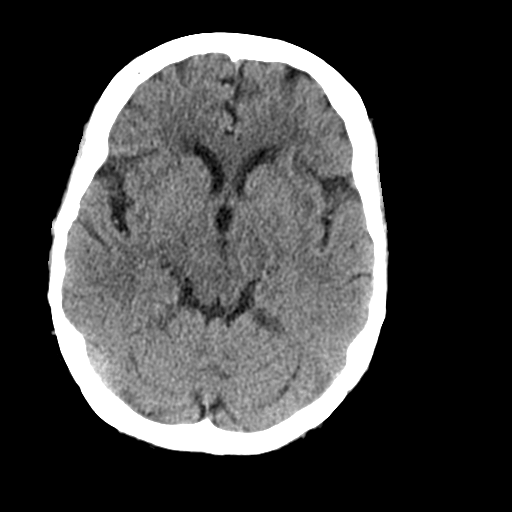
[im 13/30  brain]
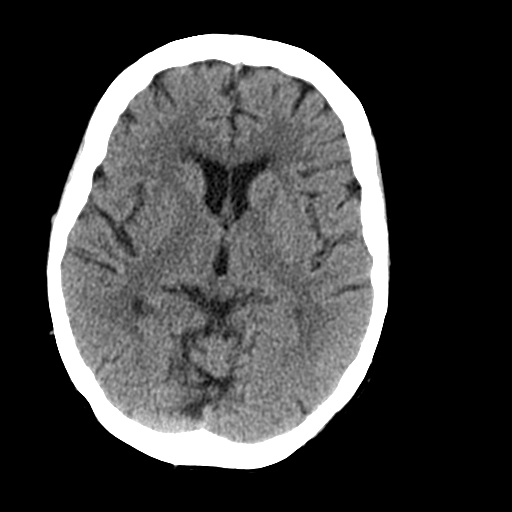
[im 13/30  bone]
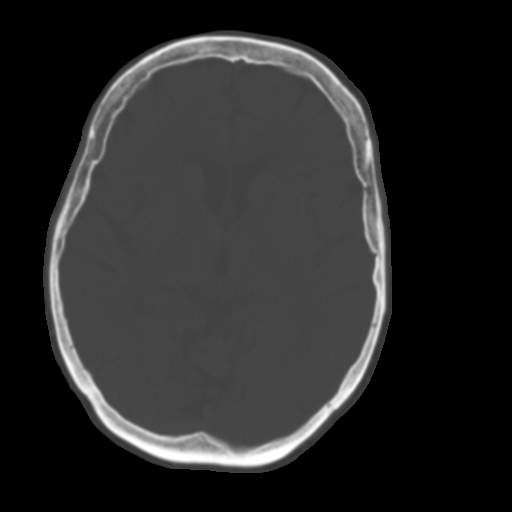
[im 17/30  brain]
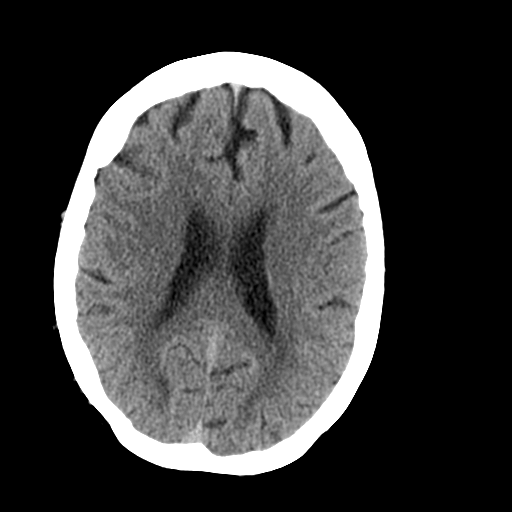
[im 19/30  brain]
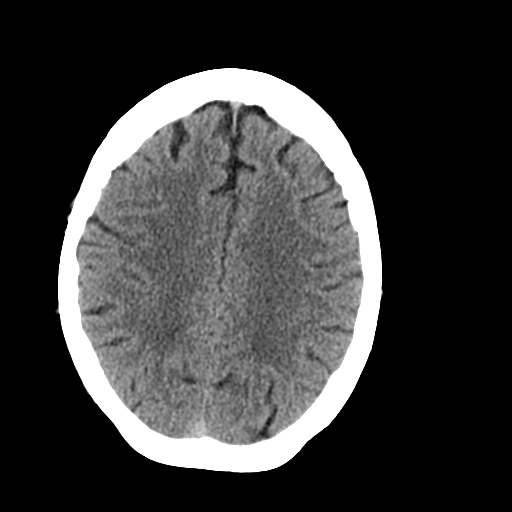
[im 21/30  brain]
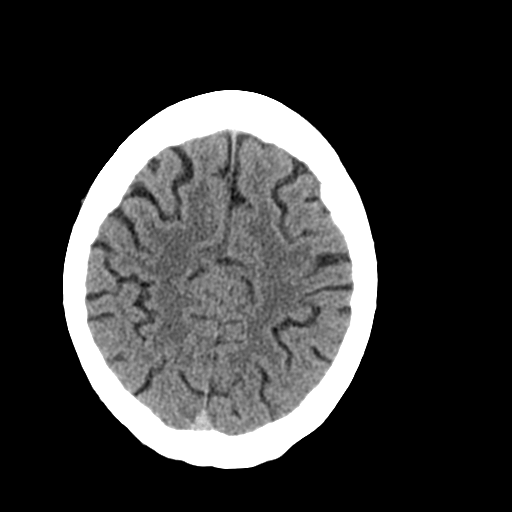
[im 25/30  brain]
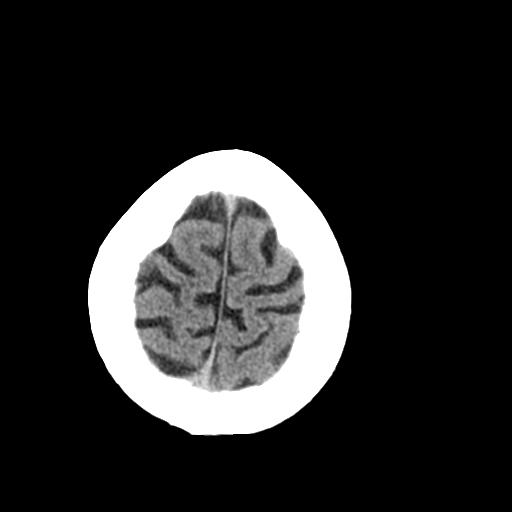
[im 25/30  bone]
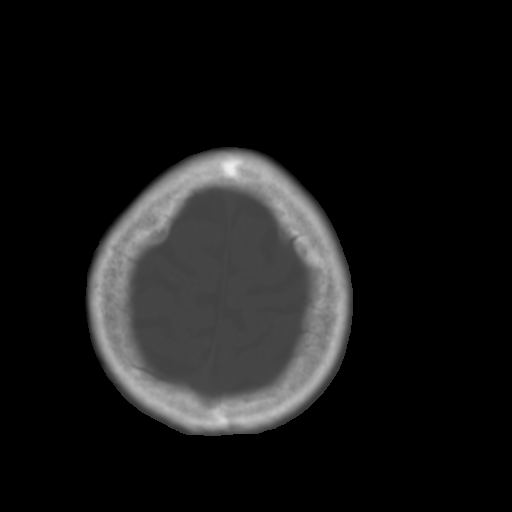
[im 27/30  brain]
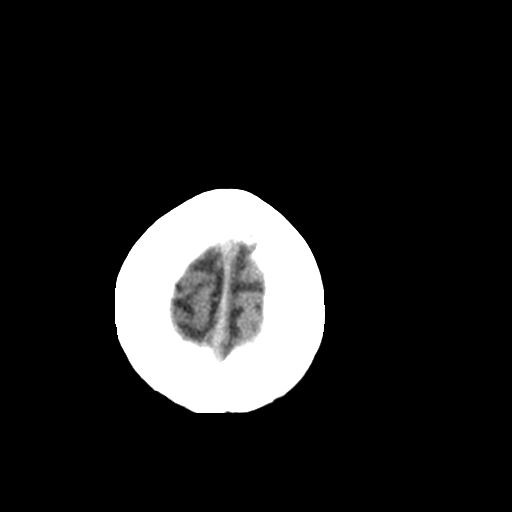

[Series 4: coronal soft · coronal · 0.31mm/px · 3 of 66 slices shown]
[im 22/66  brain]
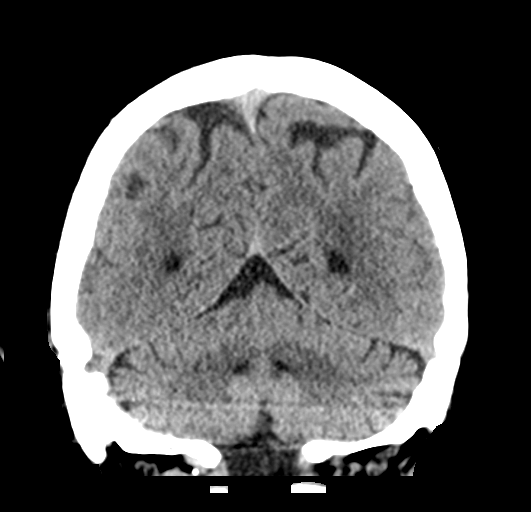
[im 29/66  brain]
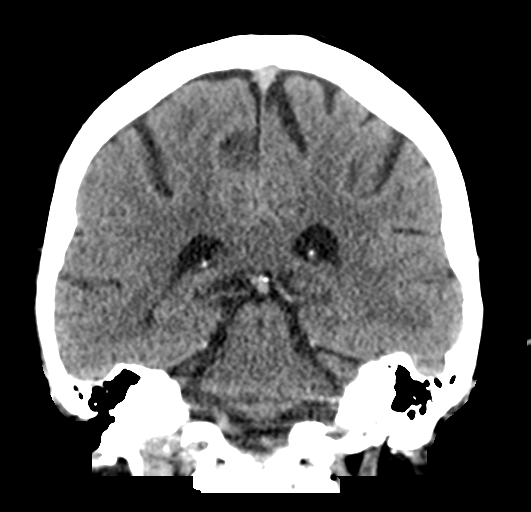
[im 37/66  brain]
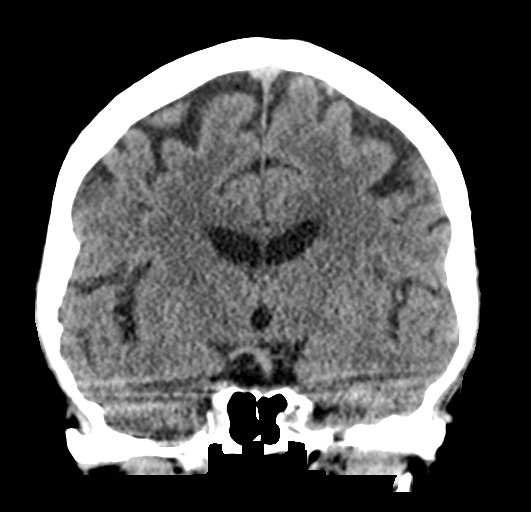

[Series 5: sagittal soft · sagittal · 0.34mm/px · 3 of 54 slices shown]
[im 18/54  brain]
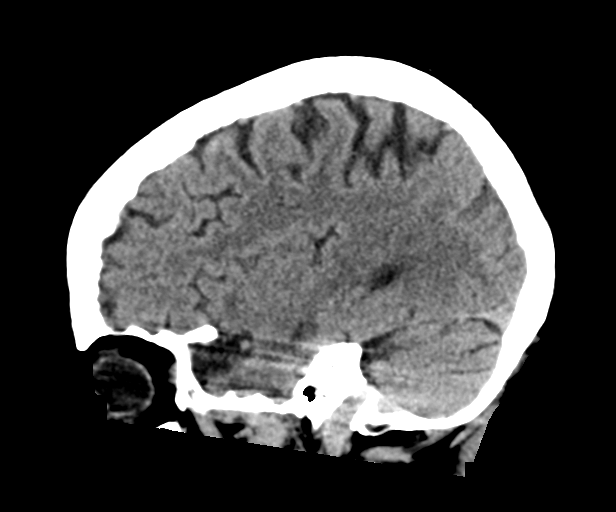
[im 27/54  brain]
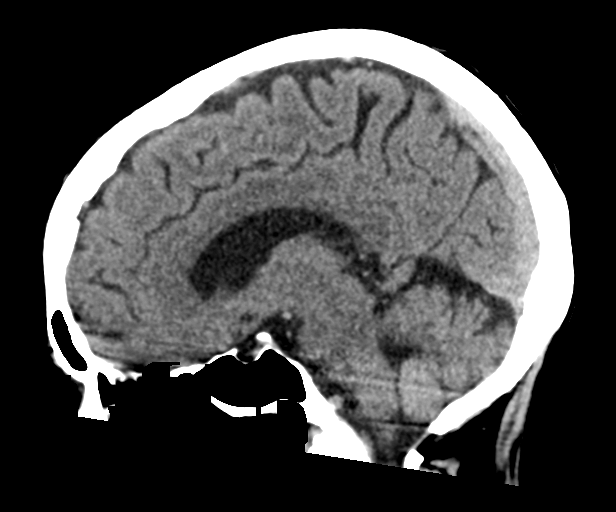
[im 36/54  brain]
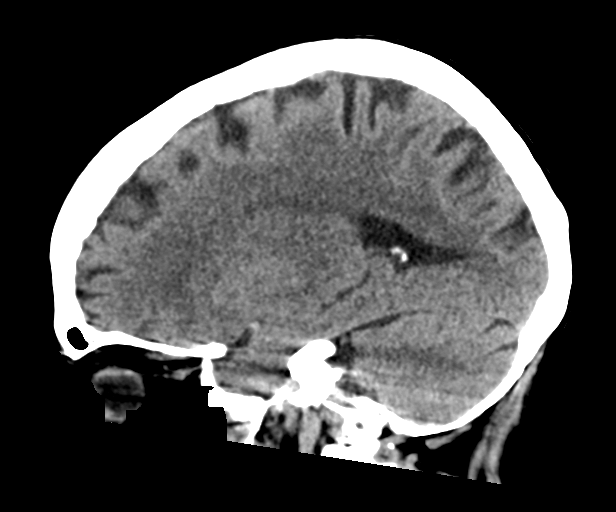

[16 of 47 positions shown; findings below may reference images not displayed]

FINDINGS: Brain: No acute intracranial hemorrhage, edema, or herniation. No
extra-axial fluid collections. No evidence of acute territorial
infarct. No hydrocephalus. Mild patchy hypodensities in the
periventricular and subcortical white matter, likely secondary to
chronic microvascular ischemic changes. Approximately 2.2 x 0.9 cm
extra-axial hypodensity in the anterior right middle cranial fossa
which is chronic and likely represents an arachnoid cyst.

Vascular: No hyperdense vessel or unexpected calcification.

Skull: Normal. Negative for fracture or focal lesion.

Sinuses/Orbits: No acute finding.

Other: None.
IMPRESSION: Chronic changes as described with no acute intracranial process
identified.

## 2021-03-07 MED ORDER — RIVAROXABAN 20 MG PO TABS: 20.0000 mg | ORAL_TABLET | Freq: Once | ORAL | Status: DC

## 2021-03-07 MED ORDER — ROSUVASTATIN CALCIUM 10 MG PO TABS: 10.0000 mg | ORAL_TABLET | Freq: Every day | ORAL | Status: DC

## 2021-03-07 MED ORDER — METOPROLOL SUCCINATE ER 25 MG PO TB24: 25.0000 mg | ORAL_TABLET | Freq: Every day | ORAL | Status: DC

## 2021-03-07 MED ORDER — RIVAROXABAN 20 MG PO TABS: 20.0000 mg | ORAL_TABLET | Freq: Every day | ORAL | Status: DC

## 2021-03-07 MED ORDER — ASPIRIN EC 81 MG PO TBEC: 81.0000 mg | DELAYED_RELEASE_TABLET | Freq: Every day | ORAL | Status: DC

## 2021-03-07 MED ORDER — ASPIRIN EC 81 MG PO TBEC
81.0000 mg | DELAYED_RELEASE_TABLET | Freq: Every day | ORAL | Status: DC
  Administered 2021-03-08 – 2021-03-09 (×2): 81 mg via ORAL
  Filled 2021-03-07 (×2): qty 1

## 2021-03-07 MED ORDER — ACETAMINOPHEN 650 MG RE SUPP: 650.0000 mg | RECTAL | Status: DC | PRN

## 2021-03-07 MED ORDER — ACETAMINOPHEN 160 MG/5ML PO SOLN: 650.0000 mg | ORAL | Status: DC | PRN

## 2021-03-07 MED ORDER — POLYETHYL GLYCOL-PROPYL GLYCOL 0.4-0.3 % OP GEL
OPHTHALMIC | Status: DC | PRN
  Filled 2021-03-07: qty 10

## 2021-03-07 MED ORDER — ACETAMINOPHEN 325 MG PO TABS: 650.0000 mg | ORAL_TABLET | ORAL | Status: DC | PRN

## 2021-03-07 MED ORDER — METOPROLOL SUCCINATE ER 25 MG PO TB24
25.0000 mg | ORAL_TABLET | Freq: Every day | ORAL | Status: DC
  Administered 2021-03-08 – 2021-03-09 (×2): 25 mg via ORAL
  Filled 2021-03-07 (×2): qty 1

## 2021-03-07 MED ORDER — RIVAROXABAN 15 MG PO TABS
15.0000 mg | ORAL_TABLET | Freq: Once | ORAL | Status: AC
  Administered 2021-03-07: 15 mg via ORAL
  Filled 2021-03-07: qty 1

## 2021-03-07 MED ORDER — RIVAROXABAN 15 MG PO TABS: 15.0000 mg | ORAL_TABLET | Freq: Every day | ORAL | Status: DC

## 2021-03-07 MED ORDER — STROKE: EARLY STAGES OF RECOVERY BOOK
Freq: Once | Status: DC
  Filled 2021-03-07: qty 1

## 2021-03-07 NOTE — Assessment & Plan Note (Addendum)
Personally reviewed EKG--a flutter, nonspecific ST change CHADSVASc = 7 (HTN, age x 2, stroke x 2, CAD, female) Xarelto started in the ED, continue Xarelto Patient requesting cardiology consult, or consult cardiology Echo

## 2021-03-07 NOTE — Assessment & Plan Note (Signed)
Continue metoprolol Blood pressure well controlled in the ED at 140s over 80s

## 2021-03-07 NOTE — Assessment & Plan Note (Addendum)
With history of NSTEMI and DES to prox LAD Jan 2021 Continue metoprolol, Crestor Patient reports that cardiology advised her to stop taking Plavix No chest pain

## 2021-03-07 NOTE — Assessment & Plan Note (Addendum)
Continue Crestor LDL 71

## 2021-03-07 NOTE — Assessment & Plan Note (Addendum)
-  Appreciate Neurology Consult -PT/OT evaluation -Speech therapy eval -CT brain--neg -MRI brain--neg for acute findings -Carotid Duplex-- -Echo-- -LDL--71 -HbA1C-- -Antiplatelet--Xarelto EEG

## 2021-03-07 NOTE — H&P (Signed)
History and Physical    Patient: Theresa Norman [id number] DOB: August 19, 1940 DOA: 03/07/2021 DOS: the patient was seen and examined on 03/07/2021 PCP: Celene Squibb, MD  Patient coming from: Home  Chief Complaint:  Chief Complaint  Patient presents with   Altered Mental Status    HPI: Theresa Norman is a 81 y.o. female with medical history significant of with history of anxiety, coronary artery disease, hypertension, mixed hyperlipidemia, and more presents the ED with a chief complaint of memory loss.  Patient reports that around 1 PM she had sudden onset of confusion and memory loss.  She did not recognize her children or grandchildren.  She says that it lasted about 10 minutes.  Daughter at bedside reports that it lasted more like an hour.  It started getting better, gradually, after about 1/2-hour.  Daughter reports that patient was having trouble with word finding and using the correct word.  She would point to her mouth and said that her mouth was tingling but called her arm.  When daughter would ask her questions during this episode patient would answer I am "fun" instead of fine.  This is the first time this is ever happened to the patient.  She did not have any pain during the episode.  She has been totally in her normal state of health.  Daughter reports no facial droop, but it was the right upper lip that was having paresthesias.  Patient was able to ambulate out of the house with EMTs.  The only other thing out of the norm for patient is that she had an increase in urine output.  She reports a good urine output, way more than she is taking in.  No dysuria, no urgency, no frequency.  Patient does not smoke, does not drink alcohol, does not use illicit drugs.  Patient is not vaccinated for COVID.  Patient is DNR.  Review of Systems: As mentioned in the history of present illness. All other systems reviewed and are negative. Past Medical History:  Diagnosis Date   Anxiety    Arthritis     CAD (coronary artery disease), native coronary artery    DES to proximal LAD January 2021   Essential hypertension    Immune deficiency disorder [facility])    Mixed hyperlipidemia    NSTEMI (non-ST elevated myocardial infarction) [facility])    January 2021   Past Surgical History:  Procedure Laterality Date   CATARACT EXTRACTION W/PHACO Right 11/07/2016   Procedure: CATARACT EXTRACTION PHACO AND INTRAOCULAR LENS PLACEMENT RIGHT EYE;  Surgeon: Tonny Branch, MD;  Location: [facility];  Service: Ophthalmology;  Laterality: Right;  CDE: 13.30   CATARACT EXTRACTION W/PHACO Left 11/20/2016   Procedure: CATARACT EXTRACTION PHACO AND INTRAOCULAR LENS PLACEMENT (IOC);  Surgeon: Tonny Branch, MD;  Location: [facility];  Service: Ophthalmology;  Laterality: Left;  CDE: 20.00   CORONARY STENT INTERVENTION N/A 01/29/2019   Procedure: CORONARY STENT INTERVENTION;  Surgeon: Wellington Hampshire, MD;  Location: [place] [facility];  Service: Cardiovascular;  Laterality: N/A;   INTRAVASCULAR PRESSURE WIRE/FFR STUDY N/A 01/29/2019   Procedure: INTRAVASCULAR PRESSURE WIRE/FFR STUDY;  Surgeon: Wellington Hampshire, MD;  Location: [place] [facility];  Service: Cardiovascular;  Laterality: N/A;   LEFT HEART CATH AND CORONARY ANGIOGRAPHY N/A 01/29/2019   Procedure: LEFT HEART CATH AND CORONARY ANGIOGRAPHY;  Surgeon: Wellington Hampshire, MD;  Location: [place] [facility];  Service: Cardiovascular;  Laterality: N/A;   Social History:  reports that she has never  smoked. She has never used smokeless tobacco. She reports that she does not drink alcohol and does not use drugs.  No Known Allergies  Family History  Problem Relation Age of Onset   Hypertension Father     Prior to Admission medications   Medication Sig Start Date End Date Taking? Authorizing Provider  aspirin EC 81 MG tablet Take 1 tablet (81 mg total) by mouth daily. Swallow whole. 12/01/20  Yes Satira Sark, MD  Carboxymethylcellulose Sodium (DRY EYE RELIEF OP) Apply 1  drop to eye as needed (dry eye).   Yes [provider]  Cholecalciferol (VITAMIN D3 PO) Take 1 tablet by mouth daily.   Yes [provider]  metoprolol succinate (TOPROL-XL) 25 MG 24 hr tablet Take 1 tablet (25 mg total) by mouth daily. 02/06/19  Yes Verta Ellen., NP  Multiple Vitamin (MULTIVITAMIN) tablet Take 1 tablet by mouth daily.   Yes [provider]  rosuvastatin (CRESTOR) 10 MG tablet Take 1 tablet (10 mg total) by mouth daily. 01/11/21  Yes Satira Sark, MD  nitroGLYCERIN (NITROSTAT) 0.4 MG SL tablet Place 1 tablet (0.4 mg total) under the tongue every 5 (five) minutes x 3 doses as needed for chest pain. 05/21/20   Satira Sark, MD    Physical Exam: Vitals:   03/07/21 1516 03/07/21 1530 03/07/21 1600 03/07/21 1700  BP: (!) 196/93 (!) 166/121 (!) 158/82 (!) 154/116  Pulse: (!) 119 91 99   Resp: 13 16 20 17   Temp: 98.7 F (37.1 C)     TempSrc: Oral     SpO2: 99% 99% 100% 93%  Weight:      Height:       1.  General: Patient lying supine in bed,  no acute distress   2. Psychiatric: Alert and oriented x 3, mood and behavior normal for situation, pleasant and cooperative with exam   3. Neurologic: Speech and language are normal, face is symmetric, moves all 4 extremities voluntarily, and with full strength, at baseline without acute deficits on limited exam   4. HEENMT:  Head is atraumatic, normocephalic, pupils reactive to light, neck is supple, trachea is midline, mucous membranes are moist   5. Respiratory : Lungs are clear to auscultation bilaterally without wheezing, rhonchi, rales, no cyanosis, no increase in work of breathing or accessory muscle use   6. Cardiovascular : Heart rate normal, rhythm is regular, no murmurs, rubs or gallops, no peripheral edema, peripheral pulses palpated   7. Gastrointestinal:  Abdomen is soft, nondistended, nontender to palpation bowel sounds active, no masses or organomegaly palpated   8.  Skin:  Skin is warm, dry and intact without rashes, acute lesions, or ulcers on limited exam   9.Musculoskeletal:  No acute deformities or trauma, no asymmetry in tone, no peripheral edema, peripheral pulses palpated, no tenderness to palpation in the extremities   Data Reviewed: T 98.7, HR 91 -119, R13 -20, BP 158/82, 99-100% WBC 7.0, Hgb 15.9, plt 223 Chem is unremarkable CT head - chronic changes w/o acute intracranial process EKG - HR 98 afib, AV block Xarelto 15mg  given in ED MRI - chronic changes without acute intracranial process   Assessment and Plan: * TIA (transient ischemic attack)- (present on admission) CT head shows chronic changes without acute intracranial process MRI shows no acute intracranial process Ultrasound bilateral carotids Echo in the a.m. PT/OT/ST   New onset atrial fibrillation ([place])- (present on admission) New onset A-fib with AV block on EKG  Xarelto started in the ED, continue Xarelto Patient requesting cardiology consult, or consult cardiology Echo in the a.m. Continue to monitor  CAD (coronary artery disease)- (present on admission) With history of NSTEMI and stent placement Continue aspirin, metoprolol, Crestor Patient reports that cardiology advised her to stop taking Plavix Check lipid panel in the a.m. Continue to monitor  High cholesterol- (present on admission) Continue Crestor Check lipid panel in a.m.  Essential hypertension- (present on admission) Continue metoprolol Blood pressure well controlled in the ED at 140s over 80s       Advance Care Planning:   Code Status: Prior DNR  Consults: Cardiology  Family Communication: Daughter at bedside  Severity of Illness: The appropriate patient status for this patient is OBSERVATION. Observation status is judged to be reasonable and necessary in order to provide the required intensity of service to ensure the patient's safety. The patient's presenting symptoms, physical exam  findings, and initial radiographic and laboratory data in the context of their medical condition is felt to place them at decreased risk for further clinical deterioration. Furthermore, it is anticipated that the patient will be medically stable for discharge from the hospital within 2 midnights of admission.   Author: Rolla Plate, DO 03/07/2021 8:41 PM  For on call review [email or web address].

## 2021-03-07 NOTE — ED Triage Notes (Addendum)
Pt brought in by [facility] from home with c/o sudden onset of confusion that lasted for about 5 minutes. Pt reports she remembers the episode. She reports she was talking family and all of a sudden she couldn't remember who people who. She denied feeling weak or being unable to speak, just that she was confused. LKW between 1300-1330. Pt presents to ED alert and oriented x 4.

## 2021-03-08 ENCOUNTER — Observation Stay (HOSPITAL_COMMUNITY)
Admit: 2021-03-08 | Discharge: 2021-03-08 | Disposition: A | Discharge status: Home / Self Care | Payer: Medicare Other | Attending: Neurology | Admitting: Neurology

## 2021-03-08 ENCOUNTER — Observation Stay (HOSPITAL_BASED_OUTPATIENT_CLINIC_OR_DEPARTMENT_OTHER): Payer: Medicare Other

## 2021-03-08 ENCOUNTER — Observation Stay (HOSPITAL_COMMUNITY): Payer: Medicare Other

## 2021-03-08 ENCOUNTER — Other Ambulatory Visit (HOSPITAL_COMMUNITY): Payer: Self-pay

## 2021-03-08 DIAGNOSIS — R42 Dizziness and giddiness: Secondary | ICD-10-CM | POA: Diagnosis not present

## 2021-03-08 DIAGNOSIS — I4892 Unspecified atrial flutter: Secondary | ICD-10-CM | POA: Diagnosis not present

## 2021-03-08 DIAGNOSIS — E7800 Pure hypercholesterolemia, unspecified: Secondary | ICD-10-CM | POA: Diagnosis not present

## 2021-03-08 DIAGNOSIS — G459 Transient cerebral ischemic attack, unspecified: Secondary | ICD-10-CM | POA: Diagnosis not present

## 2021-03-08 DIAGNOSIS — I639 Cerebral infarction, unspecified: Secondary | ICD-10-CM | POA: Diagnosis not present

## 2021-03-08 DIAGNOSIS — R531 Weakness: Secondary | ICD-10-CM | POA: Diagnosis not present

## 2021-03-08 DIAGNOSIS — I4891 Unspecified atrial fibrillation: Secondary | ICD-10-CM | POA: Diagnosis not present

## 2021-03-08 DIAGNOSIS — I771 Stricture of artery: Secondary | ICD-10-CM | POA: Diagnosis not present

## 2021-03-08 DIAGNOSIS — I10 Essential (primary) hypertension: Secondary | ICD-10-CM | POA: Diagnosis not present

## 2021-03-08 DIAGNOSIS — I6523 Occlusion and stenosis of bilateral carotid arteries: Secondary | ICD-10-CM | POA: Diagnosis not present

## 2021-03-08 DIAGNOSIS — M1990 Unspecified osteoarthritis, unspecified site: Secondary | ICD-10-CM | POA: Diagnosis not present

## 2021-03-08 DIAGNOSIS — R479 Unspecified speech disturbances: Secondary | ICD-10-CM

## 2021-03-08 DIAGNOSIS — I2510 Atherosclerotic heart disease of native coronary artery without angina pectoris: Secondary | ICD-10-CM | POA: Diagnosis not present

## 2021-03-08 LAB — LIPID PANEL
Cholesterol: 145 mg/dL (ref 0–200)
HDL: 60 mg/dL (ref >40)
LDL Cholesterol: 71 mg/dL (ref 0–99)
Total CHOL/HDL Ratio: 2.4 RATIO
Triglycerides: 70 mg/dL (ref <150)
VLDL: 14 mg/dL (ref 0–40)

## 2021-03-08 LAB — COMPREHENSIVE METABOLIC PANEL
ALT: 13 U/L (ref 0–44)
AST: 20 U/L (ref 15–41)
Albumin: 3.7 g/dL (ref 3.5–5.0)
Alkaline Phosphatase: 47 U/L (ref 38–126)
Anion gap: 10 (ref 5–15)
BUN: 20 mg/dL (ref 8–23)
CO2: 23 mmol/L (ref 22–32)
Calcium: 9.1 mg/dL (ref 8.9–10.3)
Chloride: 105 mmol/L (ref 98–111)
Creatinine, Ser: 0.81 mg/dL (ref 0.44–1.00)
GFR, Estimated: >60 mL/min (ref >60)
Glucose, Bld: 93 mg/dL (ref 70–99)
Potassium: 3.9 mmol/L (ref 3.5–5.1)
Sodium: 138 mmol/L (ref 135–145)
Total Bilirubin: 0.8 mg/dL (ref 0.3–1.2)
Total Protein: 6.6 g/dL (ref 6.5–8.1)

## 2021-03-08 LAB — HEMOGLOBIN A1C
Hgb A1c MFr Bld: 5 % (ref 4.8–5.6)
Mean Plasma Glucose: 96.8 mg/dL

## 2021-03-08 LAB — ECHOCARDIOGRAM COMPLETE
AR max vel: 2.07 cm2
AV Area VTI: 2.03 cm2
AV Area mean vel: 2.1 cm2
AV Mean grad: 2 mmHg
AV Peak grad: 4.8 mmHg
Ao pk vel: 1.1 m/s
Area-P 1/2: 2.9 cm2
Calc EF: 61.9 %
Height: 61 in
MV VTI: 2.29 cm2
S' Lateral: 2.9 cm
Single Plane A2C EF: 54.1 %
Single Plane A4C EF: 69.8 %
Weight: 2032.01 oz

## 2021-03-08 LAB — MAGNESIUM: Magnesium: 2.3 mg/dL (ref 1.7–2.4)

## 2021-03-08 LAB — TSH: TSH: 3.854 u[IU]/mL (ref 0.350–4.500)

## 2021-03-08 IMAGING — US US CAROTID DUPLEX BILAT
1 series · 13 of 24 positions shown · non-contrast
Comparison: [DATE]

CLINICAL DATA: 80-year-old female with history of TIA.

EXAM:
BILATERAL CAROTID DUPLEX ULTRASOUND
TECHNIQUE: Gray scale imaging, color Doppler and duplex ultrasound were
performed of bilateral carotid and vertebral arteries in the neck.

[Series 1: us carotid bilateral · 13 of 68 slices shown]
[im 1/68]
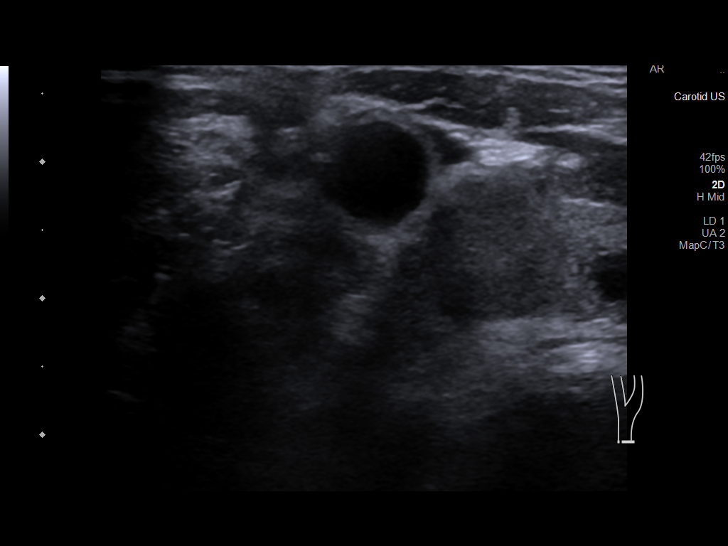
[im 6/68]
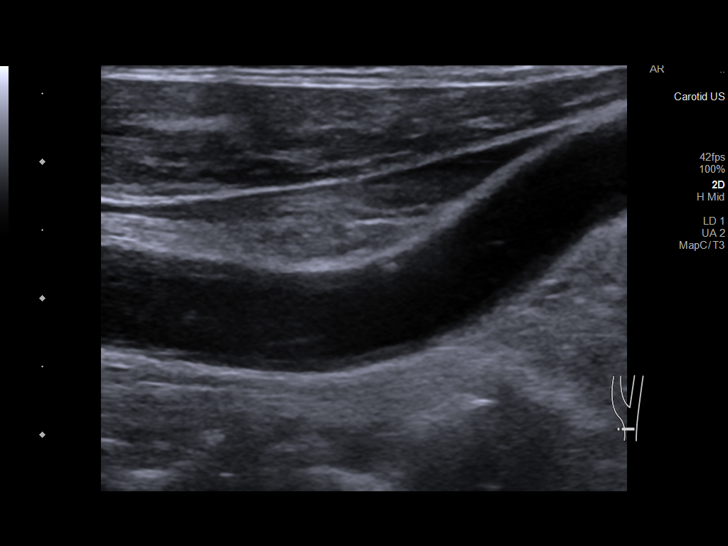
[im 12/68]
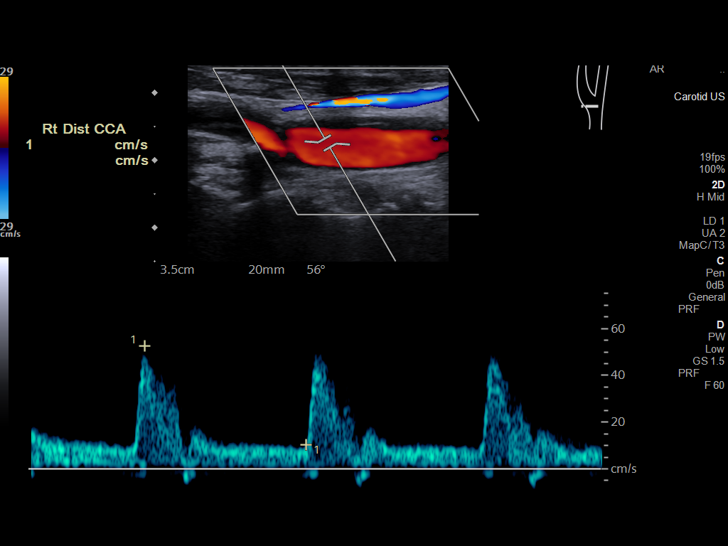
[im 18/68]
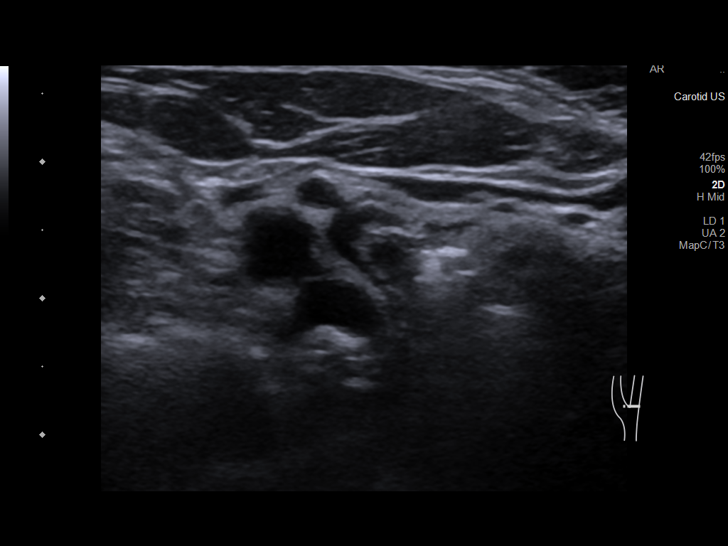
[im 24/68]
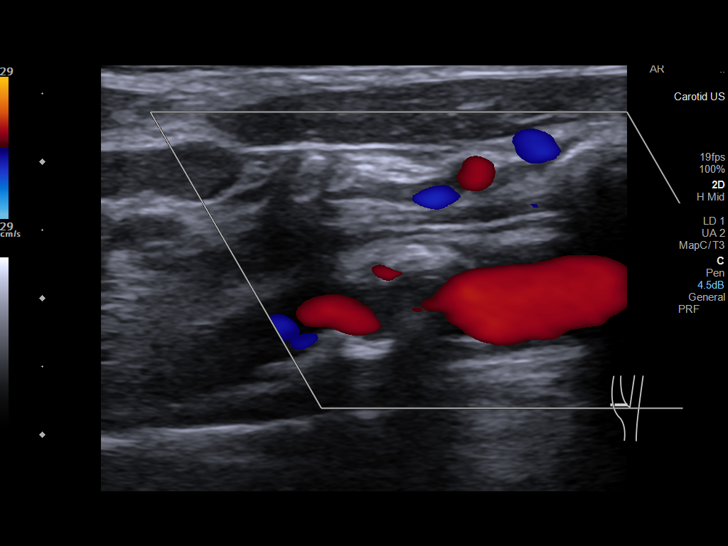
[im 30/68]
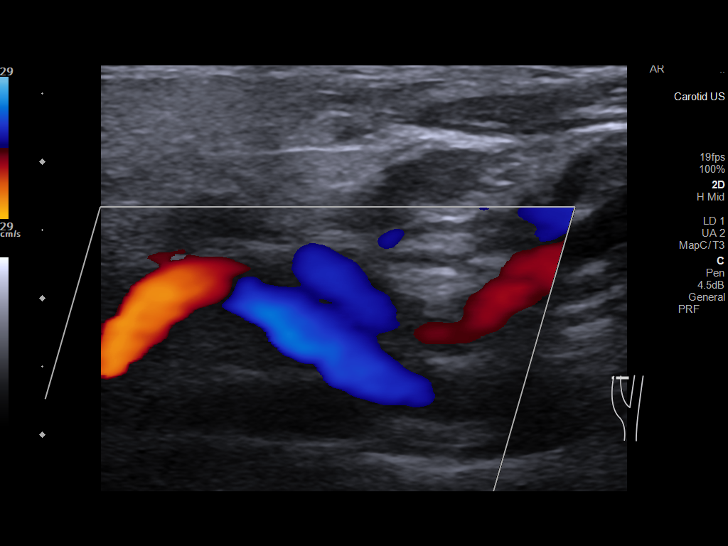
[im 35/68]
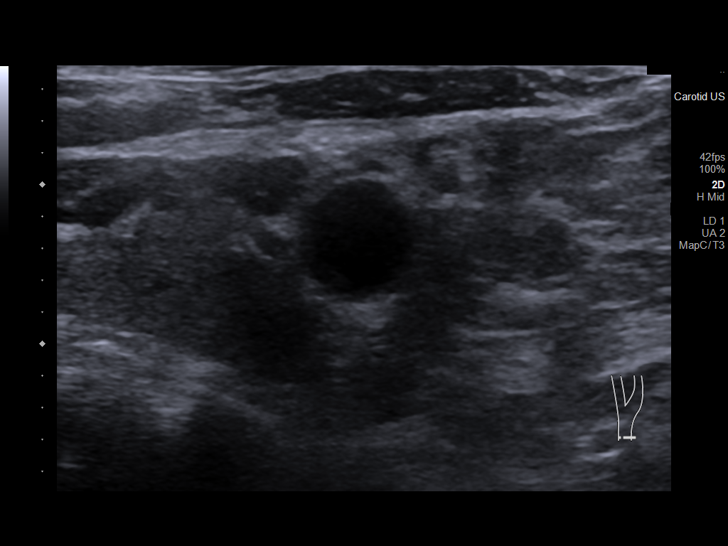
[im 38/68]
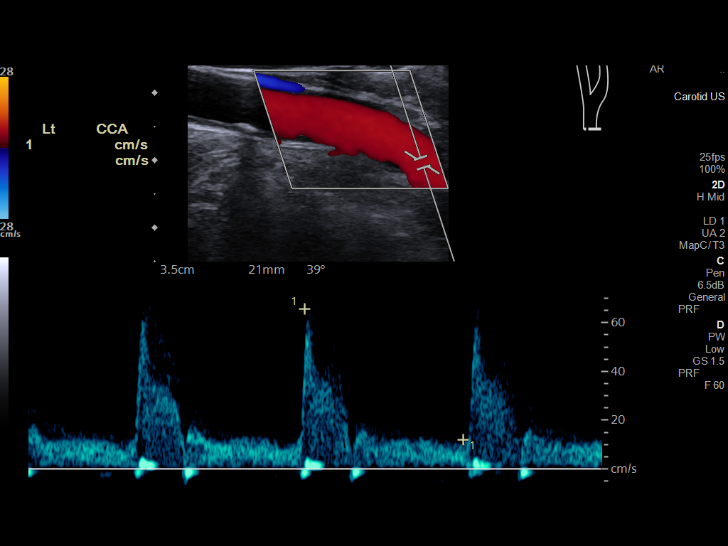
[im 44/68]
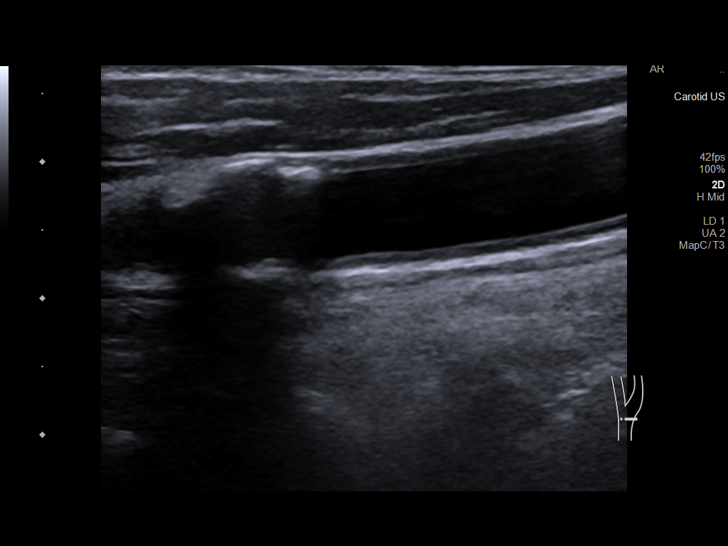
[im 50/68]
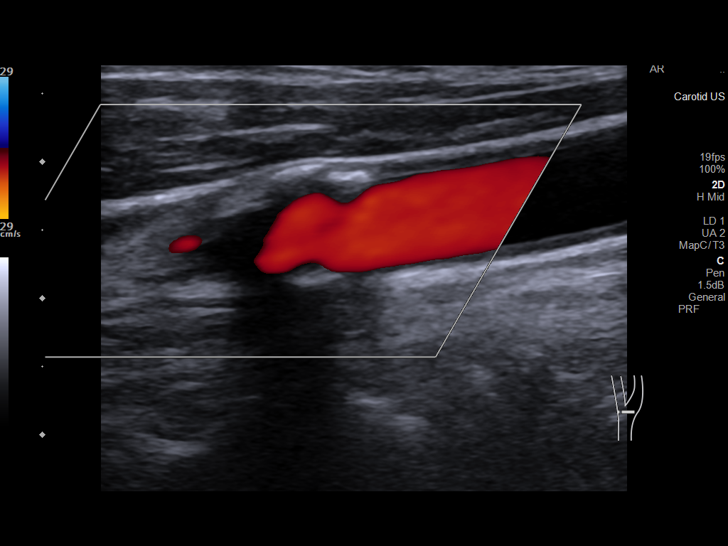
[im 56/68]
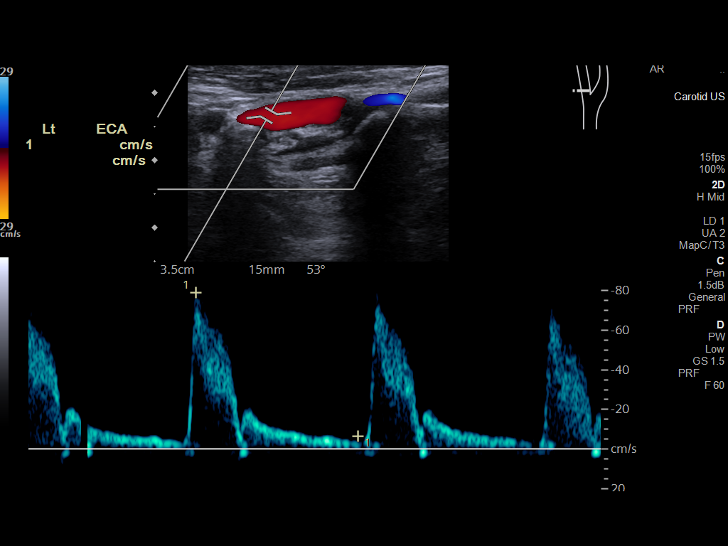
[im 62/68]
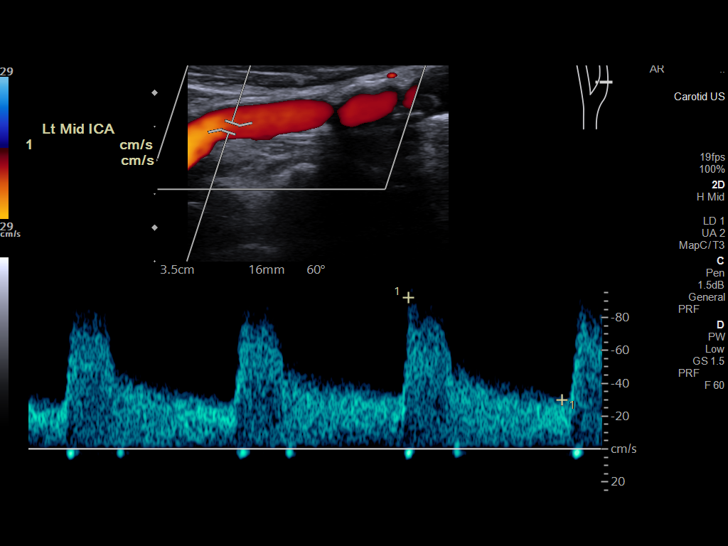
[im 68/68]
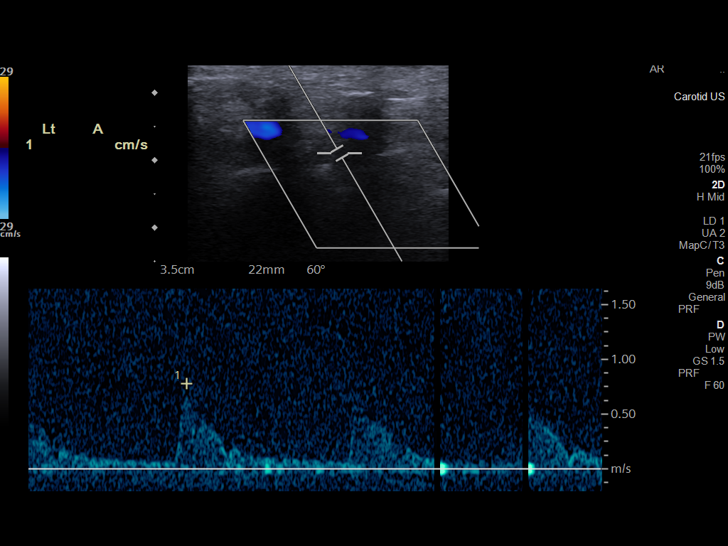

[13 of 24 positions shown; findings below may reference images not displayed]

FINDINGS: Criteria: Quantification of carotid stenosis is based on velocity
parameters that correlate the residual internal carotid diameter
with NASCET-based stenosis levels, using the diameter of the distal
internal carotid lumen as the denominator for stenosis measurement.

The following velocity measurements were obtained:

RIGHT

ICA: Peak systolic velocity 92 (previously 60) cm/sec, End diastolic
velocity 21 cm/sec

CCA: Peak systolic velocity 96 cm/sec

SYSTOLIC ICA/CCA RATIO:  1.0, previously

ECA: Peak systolic velocity 58 cm/sec

LEFT

ICA: Peak systolic velocity 144 (previously 85) cm/sec, End
diastolic velocity 35 cm/sec

CCA: 77 cm/sec

SYSTOLIC ICA/CCA RATIO:  1.9, previously

ECA: 79 cm/sec

RIGHT CAROTID ARTERY: Mild multifocal atherosclerotic plaque
formation, most prominent about the carotid bulb. Mild significant
tortuosity. Normal low resistance waveforms.

RIGHT VERTEBRAL ARTERY:  Antegrade flow.

LEFT CAROTID ARTERY: Moderate multifocal atherosclerotic plaque
formation, most prominent about the carotid bulb and proximal ICA.
Mild significant tortuosity. Normal low resistance waveforms.

LEFT VERTEBRAL ARTERY:  Antegrade flow.

Upper extremity non-invasive blood pressures:

Not obtained.
IMPRESSION: 1. Right carotid artery system: Less than 50% stenosis secondary to
mild multifocal atherosclerotic plaque formation.

2. Left carotid artery system: 50-69% stenosis secondary to moderate
multifocal atherosclerotic plaque formation, most prominent about
the carotid bulb and proximal ICA.

3.  Vertebral artery system: Patent with antegrade flow bilaterally.

## 2021-03-08 IMAGING — MR MR MRA HEAD W/O CM
1 series · 32 of 48 positions shown · non-contrast
Comparison: No pertinent prior exam.

CLINICAL DATA: Stroke follow-up.  Weakness and dizziness.

EXAM:
MRA HEAD WITHOUT CONTRAST
TECHNIQUE: Angiographic images of the Circle of Willis were acquired using MRA
technique without intravenous contrast.

[Series 2: TOF · axial · 0.5mm · 0.54mm/px · z∈[-51,+29]mm · 32 of 184 slices shown]
[im 1/184]
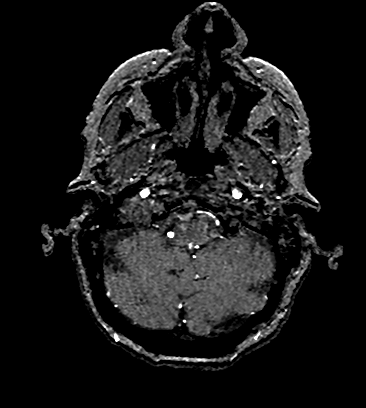
[im 4/184]
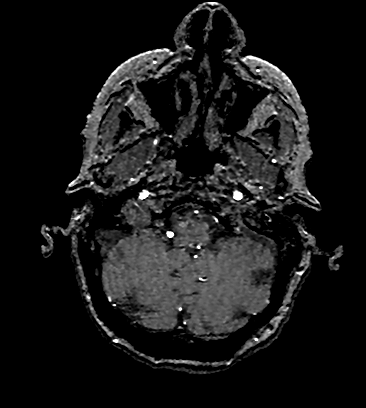
[im 8/184]
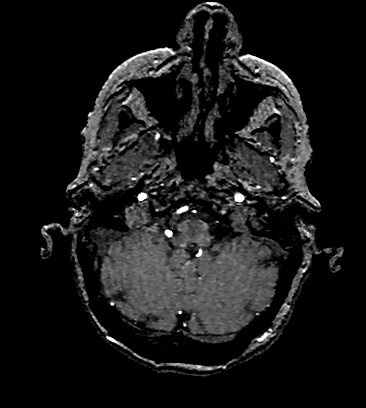
[im 12/184]
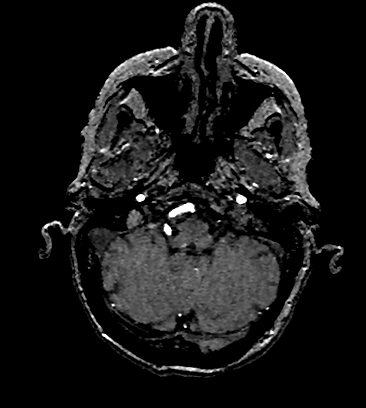
[im 16/184]
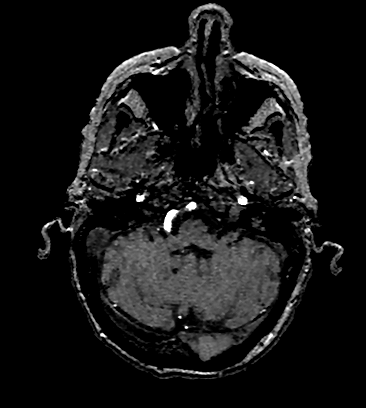
[im 20/184]
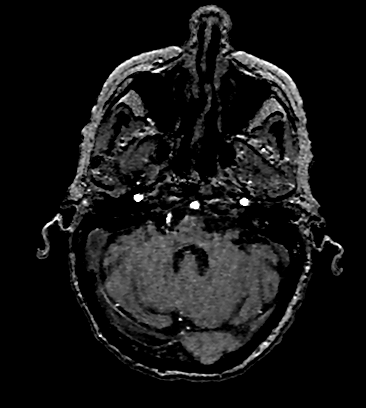
[im 24/184]
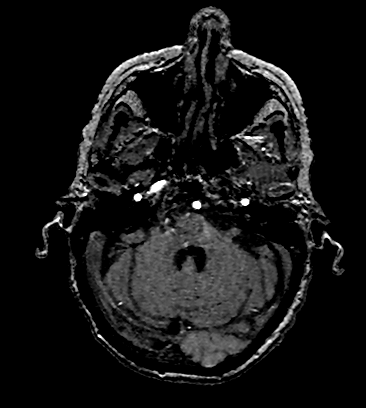
[im 28/184]
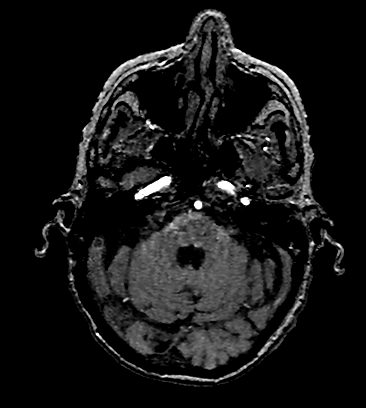
[im 32/184]
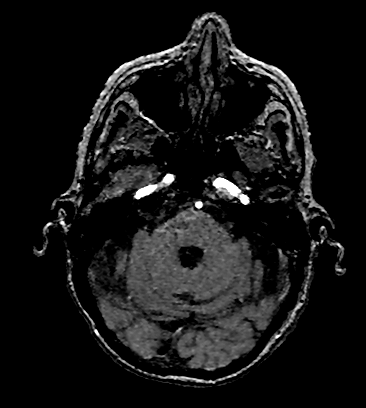
[im 36/184]
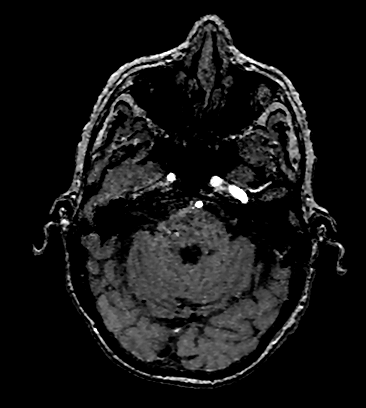
[im 39/184]
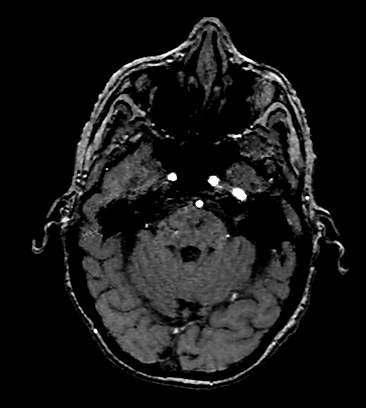
[im 43/184]
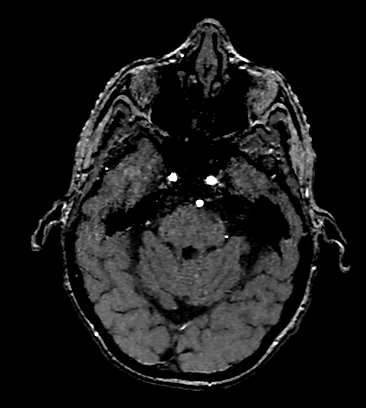
[im 47/184]
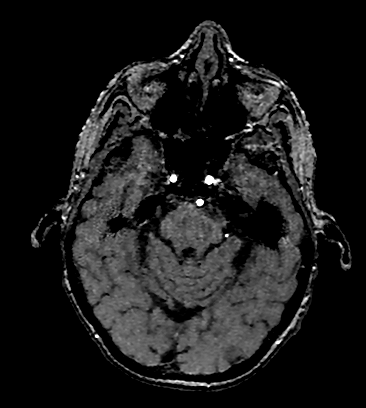
[im 51/184]
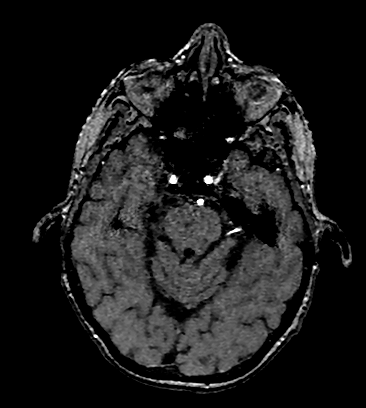
[im 55/184]
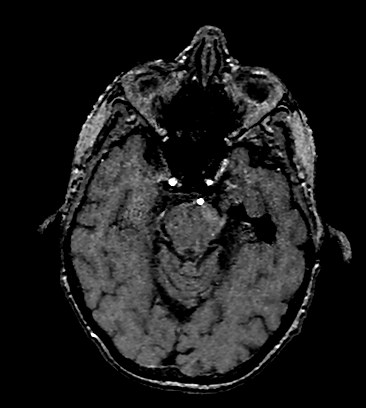
[im 59/184]
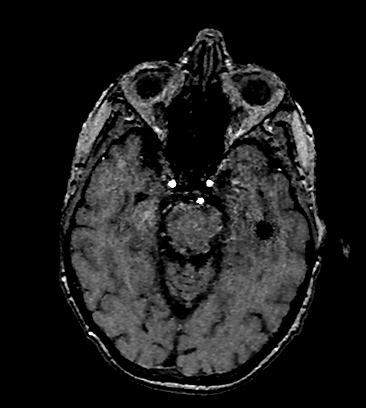
[im 63/184]
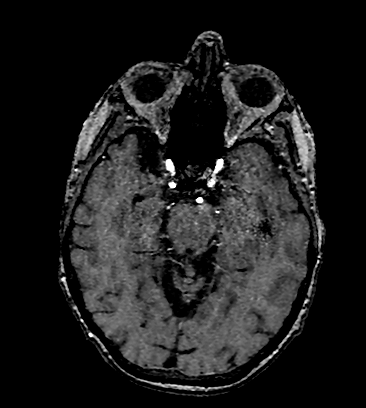
[im 67/184]
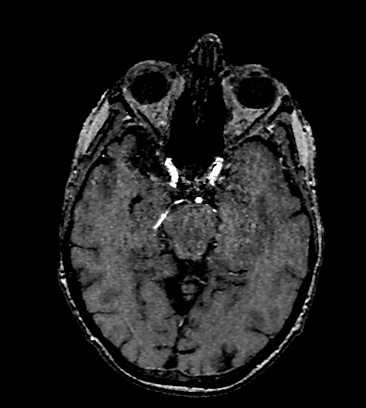
[im 71/184]
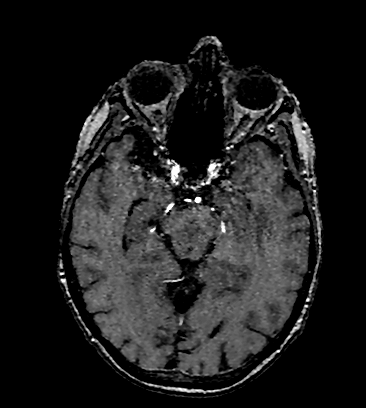
[im 74/184]
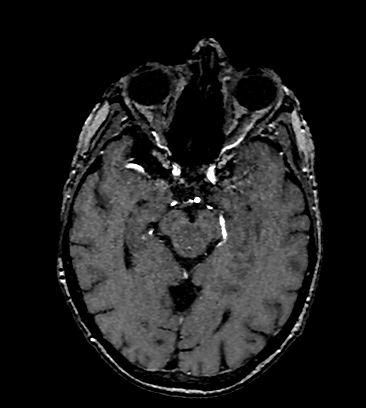
[im 78/184]
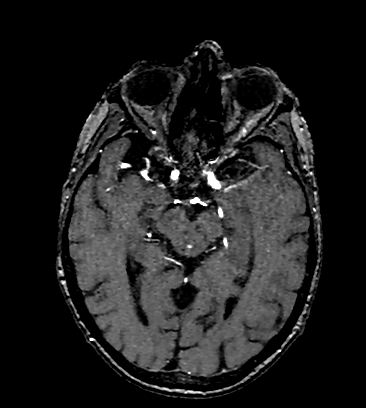
[im 82/184]
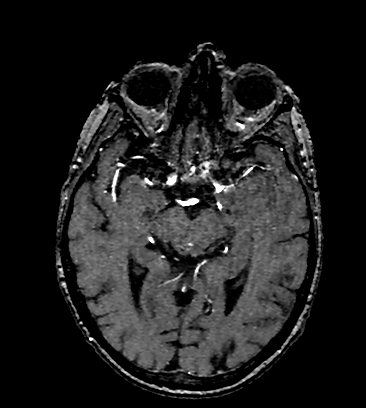
[im 86/184]
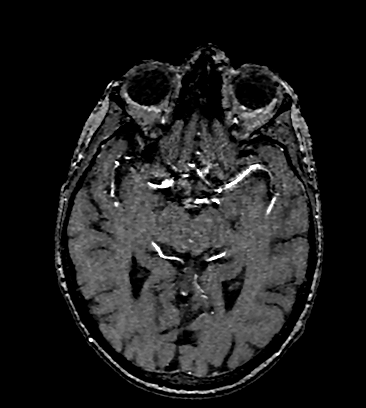
[im 90/184]
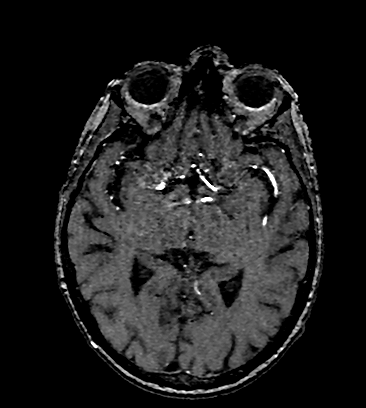
[im 94/184]
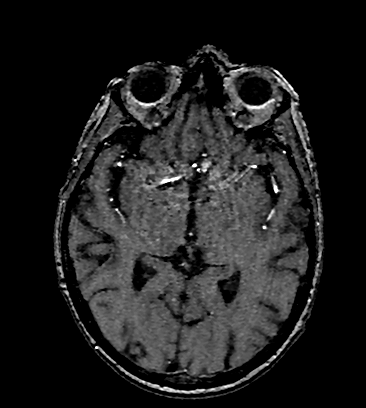
[im 98/184]
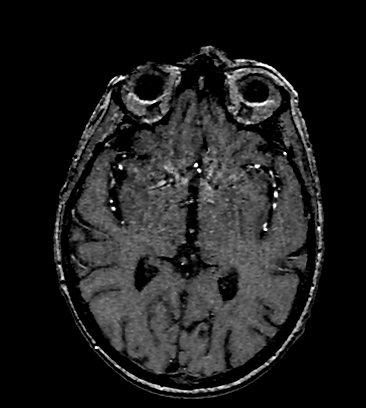
[im 102/184]
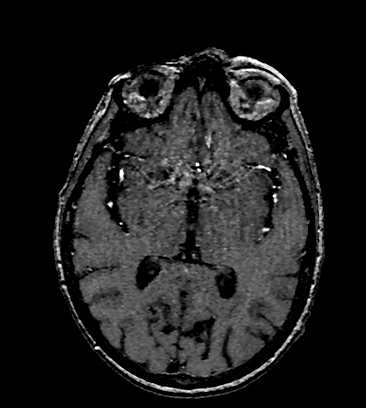
[im 106/184]
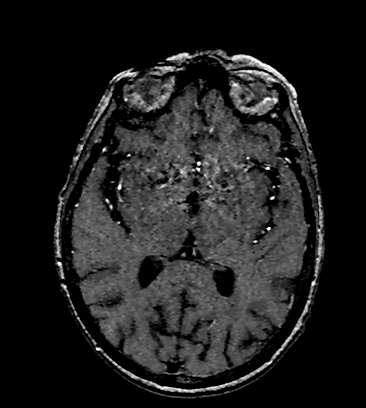
[im 129/184]
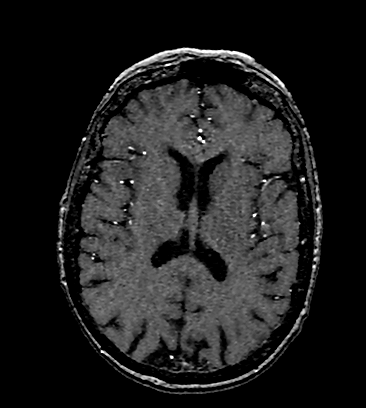
[im 152/184]
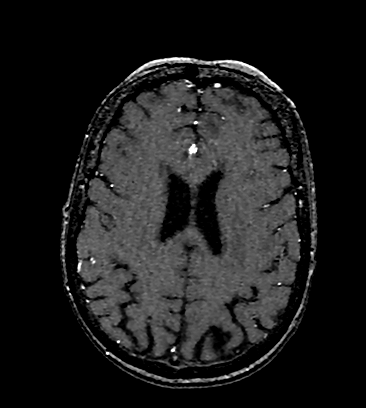
[im 156/184]
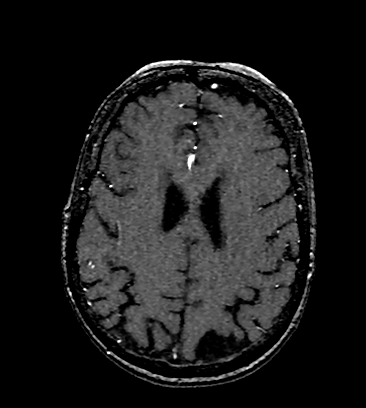
[im 176/184]
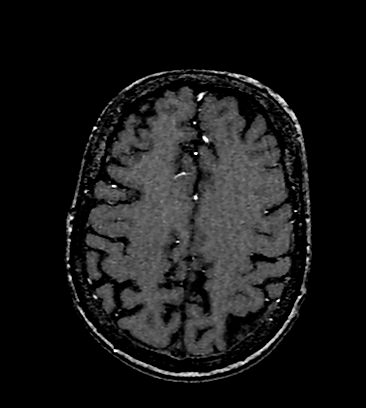

[32 of 48 positions shown; findings below may reference images not displayed]

FINDINGS: Anterior circulation: The internal carotid arteries are patent from
skull base to carotid termini. Bilateral ICA signal loss in the
anterior genu and paraclinoid regions is likely artifactual related
to vessel orientation and adjacent bone and aerated sinus, however
this precludes assessment for stenosis. ACAs and MCAs are patent
without evidence of a proximal branch occlusion. There are moderate
to severe stenoses versus artifact in both proximal A1 segments and
moderate stenoses versus artifact in the proximal left M1 and
proximal left M2 segments. Signal loss in both distal A1 segments is
likely artifactual. No aneurysm is identified.

Posterior circulation: The included distal portions of the vertebral
arteries are patent to the basilar with the right being strongly
dominant. Patent SCA origins are seen bilaterally. The basilar
artery is widely patent. Both PCAs are patent without evidence of a
significant proximal stenosis. No aneurysm is identified.

Anatomic variants: None.
IMPRESSION: 1. No large or medium vessel occlusion.
2. ACA and MCA stenoses versus artifact as above.

## 2021-03-08 MED ORDER — ROSUVASTATIN CALCIUM 20 MG PO TABS
20.0000 mg | ORAL_TABLET | Freq: Every day | ORAL | 1 refills | Status: AC
Start: 2021-03-09 — End: ?

## 2021-03-08 MED ORDER — POLYVINYL ALCOHOL 1.4 % OP SOLN: 1.0000 [drp] | OPHTHALMIC | Status: DC | PRN

## 2021-03-08 MED ORDER — APIXABAN 2.5 MG PO TABS
2.5000 mg | ORAL_TABLET | Freq: Two times a day (BID) | ORAL | Status: DC
  Administered 2021-03-08 – 2021-03-09 (×3): 2.5 mg via ORAL
  Filled 2021-03-08 (×3): qty 1

## 2021-03-08 MED ORDER — ROSUVASTATIN CALCIUM 20 MG PO TABS
20.0000 mg | ORAL_TABLET | Freq: Every day | ORAL | Status: DC
  Administered 2021-03-08 – 2021-03-09 (×2): 20 mg via ORAL
  Filled 2021-03-08 (×2): qty 1

## 2021-03-08 MED ORDER — APIXABAN 2.5 MG PO TABS: 2.5000 mg | ORAL_TABLET | Freq: Two times a day (BID) | ORAL | 1 refills | Status: DC

## 2021-03-08 NOTE — Progress Notes (Signed)
SLP Cancellation Note  Patient Details Name: Theresa Norman MRN: [id number] DOB: [birth year]   Cancelled treatment:       Reason Eval/Treat Not Completed: All speech/language changes have resolved. Pt's speech, language and cognition are functioning at baseline. SLP screened, no needs identified, will sign off.   Bryonna Sundby H. Roddie Mc, CCC-SLP Speech Language Pathologist    Wende Bushy 03/08/2021, 2:44 PM

## 2021-03-08 NOTE — Consult Note (Signed)
I connected with  Theresa Norman on 03/08/21 by a video enabled telemedicine application and verified that I am speaking with the correct person using two identifiers.   I discussed the limitations of evaluation and management by telemedicine. The patient expressed understanding and agreed to proceed.   Location of patient: [facility] Location of physician: [facility]  Neurology Consultation Reason for Consult: TIA Referring Physician: Dr Shanon Brow Tat  CC:  speech disturbance  History is obtained from: patient, chart review  HPI: Theresa Norman is a 81 y.o. female with past medical history of coronary artery disease status post stenting, hypertension, hyperlipidemia who presented with transient speech disturbance.  Last known normal 03/07/2021 at 1300.  Sudden onset confusion memory loss, was not able to recognize her children or grandchildren.  The episode lasted about 10 minutes per patient.  However per daughter it lasted for about an hour.  Per chart review, patient daughter states patient was having trouble with word finding for example she said" fun" instead of fine.  When patient presented to ED, her symptoms had resolved, blood pressure was 158/88.  Therefore patient was admitted for MRI and neurology was consulted today for TIA work-up.  Of note, on admission patient was also noted to be in atrial fibrillation which is a new diagnosis.  Cardiology was consulted and recommended starting apixaban 2.5 mg twice daily  Patient states she takes aspirin 81 mg daily.  She also reports taking Plavix 75 mg daily after her cardiac stenting which was discontinued about 3 months ago.   ROS: All other systems reviewed and negative except as noted in the HPI.   Past Medical History:  Diagnosis Date   Anxiety    Arthritis    CAD (coronary artery disease), native coronary artery    DES to proximal LAD January 2021   Essential hypertension    Immune deficiency disorder [facility])     Mixed hyperlipidemia    NSTEMI (non-ST elevated myocardial infarction) [facility])    January 2021    Family History  Problem Relation Age of Onset   Hypertension Father     Social History:  reports that she has never smoked. She has never used smokeless tobacco. She reports that she does not drink alcohol and does not use drugs.   Medications Prior to Admission  Medication Sig Dispense Refill Last Dose   aspirin EC 81 MG tablet Take 1 tablet (81 mg total) by mouth daily. Swallow whole. 90 tablet 3 03/07/2021   Carboxymethylcellulose Sodium (DRY EYE RELIEF OP) Apply 1 drop to eye as needed (dry eye).   Past Week   Cholecalciferol (VITAMIN D3 PO) Take 1 tablet by mouth daily.   03/07/2021   metoprolol succinate (TOPROL-XL) 25 MG 24 hr tablet Take 1 tablet (25 mg total) by mouth daily. 90 tablet 3 03/07/2021 at 0730   Multiple Vitamin (MULTIVITAMIN) tablet Take 1 tablet by mouth daily.   03/07/2021   rosuvastatin (CRESTOR) 10 MG tablet Take 1 tablet (10 mg total) by mouth daily. 30 tablet 6 03/07/2021   nitroGLYCERIN (NITROSTAT) 0.4 MG SL tablet Place 1 tablet (0.4 mg total) under the tongue every 5 (five) minutes x 3 doses as needed for chest pain. 25 tablet 3       Exam: Current vital signs: BP 127/60    Pulse 62    Temp 98.6 F (37 C) (Oral)    Resp 19    Ht 5\' 1"  (1.549 m)  Wt 57.6 kg    SpO2 98%    BMI 24.00 kg/m  Vital signs in last 24 hours: Temp:  [98.1 F (36.7 C)-98.7 F (37.1 C)] 98.6 F (37 C) (02/28 0758) Pulse Rate:  [52-119] 62 (02/28 0700) Resp:  [13-20] 19 (02/28 0700) BP: (106-196)/(53-121) 127/60 (02/28 0700) SpO2:  [93 %-100 %] 98 % (02/28 0700) Weight:  [57.6 kg] 57.6 kg (02/28 0821)   Physical Exam  Constitutional: Appears well-developed and well-nourished.  Psych: Affect appropriate to situation Eyes: No scleral injection HENT: No OP obstrucion Head: Normocephalic.  Respiratory: Effort normal, non-labored breathing Neuro: AOx3, no evidence of aphasia,  cranial nerves II-XII grossly intact, antigravity strength in all 4 extremities without drift, FTN bilaterally intact  I have reviewed labs in epic and the results pertinent to this consultation are: CBC:  Recent Labs  Lab 03/07/21 1631  WBC 7.0  NEUTROABS 4.2  HGB 15.9*  HCT 49.1*  MCV 95.3  PLT [id number]    Basic Metabolic Panel:  Lab Results  Component Value Date   NA 138 03/08/2021   K 3.9 03/08/2021   CO2 23 03/08/2021   GLUCOSE 93 03/08/2021   BUN 20 03/08/2021   CREATININE 0.81 03/08/2021   CALCIUM 9.1 03/08/2021   GFRNONAA >60 03/08/2021   GFRAA >60 01/30/2019   Lipid Panel:  Lab Results  Component Value Date   LDLCALC 71 03/08/2021   HgbA1c:  Lab Results  Component Value Date   HGBA1C 5.0 03/08/2021   Urine Drug Screen: No results found for: LABOPIA, COCAINSCRNUR, LABBENZ, AMPHETMU, THCU, LABBARB  Alcohol Level No results found for: ETH   I have reviewed the images obtained:  MRI brain without contrast 03/07/2021: No acute abnormality. Findings of chronic small vessel ischemia and generalized volume loss.   Carotid ultrasound 03/08/2021: Less than 50% stenosis in the right carotid artery secondary to mild multifocal atherosclerotic plaque formation. Left carotid artery system: 50-69% stenosis secondary to moderate multifocal atherosclerotic plaque formation, most prominent about the carotid bulb and proximal ICA.  Patent with antegrade flow bilaterally.  MRA angio head without contrast 03/08/2021: No large or medium vessel occlusion. ACA and MCA stenoses versus artifact as above.  TTE 03/08/2021: Left ventricular ejection fraction 60 to 65%, no regional wall motion abnormalities, no PFO.    ASSESSMENT/PLAN: 81 year old female with transient speech disturbance most likely due to transient ischemic attack.  Transient ischemic attack Left carotid stenosis New onset atrial fibrillation -NIHSS 0, no tPA administered as symptoms had already resolved, no  thrombectomy due to no large vessel occlusion  Recommendations: -Start apixaban 2.5 mg twice daily -Recommend continuing aspirin 81 mg daily, rosuvastatin 20 mg daily for secondary stroke prevention -Recommend follow-up with neurology in 8 to 12 weeks.   -Routine EEG ordered and pending -If within normal limits, patient can be discharged from neurology standpoint   Thank you for allowing [place] to participate in the care of this patient. If you have any further questions, please contact  me or neurohospitalist.   Zeb Comfort [facility] neurohospitalist

## 2021-03-08 NOTE — Evaluation (Signed)
Physical Therapy Evaluation Patient Details Name: Theresa Norman MRN: [id number] DOB: 1940-12-03 Today's Date: 03/08/2021  History of Present Illness  Theresa Norman is a 81 y.o. female with medical history significant of with history of anxiety, coronary artery disease, hypertension, mixed hyperlipidemia, and more presents the ED with a chief complaint of memory loss.  Patient reports that around 1 PM she had sudden onset of confusion and memory loss.  She did not recognize her children or grandchildren.  She says that it lasted about 10 minutes.  Daughter at bedside reports that it lasted more like an hour.  It started getting better, gradually, after about 1/2-hour.  Daughter reports that patient was having trouble with word finding and using the correct word.  She would point to her mouth and said that her mouth was tingling but called her arm.  When daughter would ask her questions during this episode patient would answer I am "fun" instead of fine.  This is the first time this is ever happened to the patient.  She did not have any pain during the episode.  She has been totally in her normal state of health.  Daughter reports no facial droop, but it was the right upper lip that was having paresthesias.  Patient was able to ambulate out of the house with EMTs.  The only other thing out of the norm for patient is that she had an increase in urine output.  She reports a good urine output, way more than she is taking in.  No dysuria, no urgency, no frequency.   Clinical Impression  Patient functioning near baseline for functional mobility and gait demonstrating good return for ambulation in room, hallways and transferring to/from commode in bathroom without loss of balance.  Plan:  Patient discharged from physical therapy to care of nursing for ambulation daily as tolerated for length of stay.         Recommendations for follow up therapy are one component of a multi-disciplinary discharge planning  process, led by the attending physician.  Recommendations may be updated based on patient status, additional functional criteria and insurance authorization.  Follow Up Recommendations No PT follow up    Assistance Recommended at Discharge PRN  Patient can return home with the following  Other (comment) (near baseline)    Equipment Recommendations None recommended by PT  Recommendations for Other Services       Functional Status Assessment Patient has not had a recent decline in their functional status     Precautions / Restrictions Precautions Precautions: None Restrictions Weight Bearing Restrictions: No      Mobility  Bed Mobility Overal bed mobility: Independent                  Transfers Overall transfer level: Independent                      Ambulation/Gait Ambulation/Gait assistance: Modified independent (Device/Increase time) Gait Distance (Feet): 120 Feet Assistive device: None Gait Pattern/deviations: WFL(Within Functional Limits) Gait velocity: slightly decreased     General Gait Details: grossly WFL demonstrating good return for ambulating in room, hallways with slightly labored cadence, no loss of balance  Stairs            Wheelchair Mobility    Modified Rankin (Stroke Patients Only)       Balance Overall balance assessment: Independent  Pertinent Vitals/Pain Pain Assessment Pain Assessment: No/denies pain    Home Living Family/patient expects to be discharged to:: Private residence Living Arrangements: Alone Available Help at Discharge: Family;Available PRN/intermittently Type of Home: House Home Access: Ramped entrance       Home Layout: One level Home Equipment: None Additional Comments: Family lives directly behind pt.    Prior Function Prior Level of Function : Independent/Modified Independent             Mobility Comments: Community  ambulation without AD, drives ADLs Comments: Independent     Hand Dominance   Dominant Hand: Right    Extremity/Trunk Assessment   Upper Extremity Assessment Upper Extremity Assessment: Defer to OT evaluation    Lower Extremity Assessment Lower Extremity Assessment: Overall WFL for tasks assessed    Cervical / Trunk Assessment Cervical / Trunk Assessment: Normal  Communication   Communication: No difficulties  Cognition Arousal/Alertness: Awake/alert Behavior During Therapy: WFL for tasks assessed/performed Overall Cognitive Status: Within Functional Limits for tasks assessed                                          General Comments      Exercises     Assessment/Plan    PT Assessment Patient does not need any further PT services  PT Problem List         PT Treatment Interventions      PT Goals (Current goals can be found in the Care Plan section)  Acute Rehab PT Goals Patient Stated Goal: return home with family to assist PT Goal Formulation: With patient/family Time For Goal Achievement: 03/08/21 Potential to Achieve Goals: Good    Frequency       Co-evaluation PT/OT/SLP Co-Evaluation/Treatment: Yes Reason for Co-Treatment: To address functional/ADL transfers PT goals addressed during session: Mobility/safety with mobility;Balance;Proper use of DME OT goals addressed during session: ADL's and self-care       AM-PAC PT "6 Clicks" Mobility  Outcome Measure Help needed turning from your back to your side while in a flat bed without using bedrails?: None Help needed moving from lying on your back to sitting on the side of a flat bed without using bedrails?: None Help needed moving to and from a bed to a chair (including a wheelchair)?: None Help needed standing up from a chair using your arms (e.g., wheelchair or bedside chair)?: None Help needed to walk in hospital room?: None Help needed climbing 3-5 steps with a railing? : None 6  Click Score: 24    End of Session   Activity Tolerance: Patient tolerated treatment well Patient left: in bed;with call bell/phone within reach;with family/visitor present Nurse Communication: Mobility status PT Visit Diagnosis: Unsteadiness on feet (R26.81);Other abnormalities of gait and mobility (R26.89);Muscle weakness (generalized) (M62.81)    Time: [id number] PT Time Calculation (min) (ACUTE ONLY): 20 min   Charges:   PT Evaluation $PT Eval Moderate Complexity: 1 Mod PT Treatments $Therapeutic Activity: 8-22 mins        11:44 AM, 03/08/21 Lonell Grandchild, MPT Physical Therapist with [facility] [phone] office [phone] mobile phone

## 2021-03-08 NOTE — Procedures (Signed)
Patient Name: Theresa Norman  MRN: [id number]  Epilepsy Attending: Charlsie Quest  Referring Physician/Provider: Catarina Hartshorn, MD Date: 03/08/2021 Duration: 23.45 mins  Patient history:  81 year old female with transient speech disturbance. EEG to evaluate for seizure  Level of alertness: Awake, asleep  AEDs during EEG study: None  Technical aspects: This EEG study was done with scalp electrodes positioned according to the 10-20 International system of electrode placement. Electrical activity was acquired at a sampling rate of 500Hz  and reviewed with a high frequency filter of 70Hz  and a low frequency filter of 1Hz . EEG data were recorded continuously and digitally stored.   Description: The posterior dominant rhythm consists of 9 Hz activity of moderate voltage (25-35 uV) seen predominantly in posterior head regions, symmetric and reactive to eye opening and eye closing. Sleep was characterized by vertex waves, sleep spindles (12 to 14 Hz), maximal frontocentral region. Hyperventilation and photic stimulation were not performed.     IMPRESSION: This study is within normal limits. No seizures or epileptiform discharges were seen throughout the recording.  Theresa Norman 

## 2021-03-08 NOTE — Progress Notes (Signed)
EEG completed, results pending. 

## 2021-03-08 NOTE — Discharge Instructions (Addendum)

## 2021-03-08 NOTE — Progress Notes (Signed)
*  PRELIMINARY RESULTS* Echocardiogram 2D Echocardiogram has been performed.  Carolyne Fiscal 03/08/2021, 12:38 PM

## 2021-03-08 NOTE — TOC Progression Note (Signed)
Transition of Care [facility]) - Progression Note    Patient Details  Name: [name] MRN: [id number] Date of Birth: 09-30-1940  Transition of Care [facility]) CM/SW Contact  Karn Cassis, [place] Phone Number: 03/08/2021, 11:44 AM  Clinical Narrative:   Transition of Care [facility]) Screening Note   Patient Details  Name: [name] Date of Birth: Dec 30, 1940   Transition of Care [facility]) CM/SW Contact:    Karn Cassis, LCSW Phone Number: 03/08/2021, 11:44 AM    Transition of Care Department [facility]) has reviewed patient and no TOC needs have been identified at this time. We will continue to monitor patient advancement through interdisciplinary progression rounds. If new patient transition needs arise, please place a TOC consult.         Barriers to Discharge: Continued Medical Work up  Expected Discharge Plan and Services                                                 Social Determinants of Health (SDOH) Interventions    Readmission Risk Interventions No flowsheet data found.

## 2021-03-08 NOTE — Evaluation (Signed)
Occupational Therapy Evaluation Patient Details Name: Theresa Norman MRN: [id number] DOB: [birth year] Today's Date: 03/08/2021   History of Present Illness Theresa Norman is a 81 y.o. female with medical history significant of with history of anxiety, coronary artery disease, hypertension, mixed hyperlipidemia, and more presents the ED with a chief complaint of memory loss.  Patient reports that around 1 PM she had sudden onset of confusion and memory loss.  She did not recognize her children or grandchildren.  She says that it lasted about 10 minutes.  Daughter at bedside reports that it lasted more like an hour.  It started getting better, gradually, after about 1/2-hour.  Daughter reports that patient was having trouble with word finding and using the correct word.  She would point to her mouth and said that her mouth was tingling but called her arm.  When daughter would ask her questions during this episode patient would answer I am "fun" instead of fine.  This is the first time this is ever happened to the patient.  She did not have any pain during the episode.  She has been totally in her normal state of health.  Daughter reports no facial droop, but it was the right upper lip that was having paresthesias.  Patient was able to ambulate out of the house with EMTs.  The only other thing out of the norm for patient is that she had an increase in urine output.  She reports a good urine output, way more than she is taking in.  No dysuria, no urgency, no frequency.   Clinical Impression   Pt agreeable to OT and PT co-evaluation. Pt functioning at or near baseline levels with general weakness in B UE but independent transfers and mobility. Pt reports symptoms have resolved. Pt is not recommended for any further acute OT services and will be discharged to care of nursing staff for remaining length of stay.       Recommendations for follow up therapy are one component of a multi-disciplinary discharge  planning process, led by the attending physician.  Recommendations may be updated based on patient status, additional functional criteria and insurance authorization.   Follow Up Recommendations  No OT follow up    Assistance Recommended at Discharge None        Functional Status Assessment  Patient has not had a recent decline in their functional status               Precautions / Restrictions Precautions Precautions: None Restrictions Weight Bearing Restrictions: No      Mobility Bed Mobility Overal bed mobility: Independent                  Transfers Overall transfer level: Independent Equipment used: None               General transfer comment: Able to ambulate in hall and return to bed indepednently.      Balance Overall balance assessment: Independent                                         ADL either performed or assessed with clinical judgement   ADL Overall ADL's : Independent (Per clinical judgement based on pt bed and funtional mobility as well as B UE MMT.)  Vision Baseline Vision/History: 0 No visual deficits (Cataracts surgery in the past) Ability to See in Adequate Light: 0 Adequate Patient Visual Report: No change from baseline Vision Assessment?: No apparent visual deficits                Pertinent Vitals/Pain Pain Assessment Pain Assessment: No/denies pain     Hand Dominance Right   Extremity/Trunk Assessment Upper Extremity Assessment Upper Extremity Assessment: Generalized weakness   Lower Extremity Assessment Lower Extremity Assessment: Defer to PT evaluation   Cervical / Trunk Assessment Cervical / Trunk Assessment: Normal   Communication Communication Communication: No difficulties   Cognition Arousal/Alertness: Awake/alert Behavior During Therapy: WFL for tasks assessed/performed Overall Cognitive Status: Within Functional  Limits for tasks assessed                                                        Home Living Family/patient expects to be discharged to:: Private residence Living Arrangements: Alone Available Help at Discharge: Family;Available PRN/intermittently Type of Home: House Home Access: Ramped entrance     Home Layout: One level     Bathroom Shower/Tub: Producer, television/film/video: Handicapped height Bathroom Accessibility: Yes How Accessible: Accessible via walker Home Equipment: None   Additional Comments: Family lives directly behind pt.      Prior Functioning/Environment Prior Level of Function : Independent/Modified Independent             Mobility Comments: Community ambulation without AD                        OT Goals(Current goals can be found in the care plan section) Acute Rehab OT Goals Patient Stated Goal: return home  OT Frequency:      Co-evaluation PT/OT/SLP Co-Evaluation/Treatment: Yes Reason for Co-Treatment: To address functional/ADL transfers   OT goals addressed during session: ADL's and self-care      AM-PAC OT "6 Clicks" Daily Activity     Outcome Measure Help from another person eating meals?: None Help from another person taking care of personal grooming?: None Help from another person toileting, which includes using toliet, bedpan, or urinal?: None Help from another person bathing (including washing, rinsing, drying)?: None Help from another person to put on and taking off regular upper body clothing?: None Help from another person to put on and taking off regular lower body clothing?: None 6 Click Score: 24   End of Session    Activity Tolerance: Patient tolerated treatment well Patient left: in bed;with call bell/phone within reach;with family/visitor present  OT Visit Diagnosis: Other symptoms and signs involving the nervous system (E99.371)                Time: 6967-8938 OT Time Calculation  (min): 12 min Charges:  OT General Charges $OT Visit: 1 Visit OT Evaluation $OT Eval Low Complexity: 1 Low  [name] OT, MOT  Danie Chandler 03/08/2021, 9:32 AM

## 2021-03-08 NOTE — Consult Note (Addendum)
Cardiology Consultation:   Patient ID: [name] MRN: [id number]; DOB: July 20, 1940  Admit date: 03/07/2021 Date of Consult: 03/08/2021  PCP:  Celene Squibb, MD   [place] [facility] Cardiologist:  Rozann Lesches, MD        Patient Profile:   Theresa Norman is a delightful 81 y.o. female with a hx of CAD with NSTEMI 01/2019 s/p DES to LAD (EF normal at that time), anxiety, arthritis, HTN, HLD who is being seen 03/08/2021 for the evaluation of TIA and atrial flutter at the request of Dr. Carles Collet.  History of Present Illness:   Theresa Norman is followed by Dr. Domenic Polite with cardiac hx as above. At time of cath in 2021 there was residual 90% 3rd RPL disease managed medically. Echo at that time showed EF 55-60, moderate LVH, normal LA size, mild PR, mild aortic sclerosis without stenosis. She states she had been really pressing to come off Plavix due to easy bruising (to remain on ASA), but denies any history of significant bleeding in the past. She considers herself to be in good health at 81 years old and says aside from the time she had her MI and delivered her babies, this is the only other time she's been admitted to the hospital.   Yesterday while eating lunch with her daughter and family she had an episode of abrupt aphasia. She says she knew what she wanted to say but could not quite get the words out. Around that time she also felt some slight tingling of her right arm and right side of her mouth. H/P also outlines that there was word substitution as well, such as the patient saying "I am fun" instead of "I am fine." No facial droop, syncope, focal weakness or visual disturbance. The patient states the episode lasted about 10-15 minutes total - since some of her family has nursing background, EMS was called and when they got there the patient states she was asking them what had just happened. Upon arrival to the hospital head CT and brain MRI were performed which were negative for stroke,  but she was admitted with high suspicion for TIA. Telemetry has revealed paroxysms of what appears to be atrial flutter with both 2:1 and variable conduction, interspersed with NSR as well as SB (nadir 49bpm) while sleeping. She denies any recent awareness of palpitations, no recent CP/dyspnea either. Labs pertinent for Hgb 15.9 similar to prior, otherwise no obvious abnormalities. She has been started on Xarelto 15mg  daily by the ED team. She has no residual effects and feels great this morning.  Past Medical History:  Diagnosis Date   Anxiety    Arthritis    CAD (coronary artery disease), native coronary artery    DES to proximal LAD January 2021   Essential hypertension    Immune deficiency disorder [facility])    Mixed hyperlipidemia    NSTEMI (non-ST elevated myocardial infarction) [facility])    January 2021    Past Surgical History:  Procedure Laterality Date   CATARACT EXTRACTION W/PHACO Right 11/07/2016   Procedure: CATARACT EXTRACTION PHACO AND INTRAOCULAR LENS PLACEMENT RIGHT EYE;  Surgeon: Tonny Hayven Croy, MD;  Location: [facility];  Service: Ophthalmology;  Laterality: Right;  CDE: 13.30   CATARACT EXTRACTION W/PHACO Left 11/20/2016   Procedure: CATARACT EXTRACTION PHACO AND INTRAOCULAR LENS PLACEMENT (IOC);  Surgeon: Tonny Annisha Baar, MD;  Location: [facility];  Service: Ophthalmology;  Laterality: Left;  CDE: 20.00   CORONARY STENT INTERVENTION N/A 01/29/2019   Procedure: CORONARY STENT  INTERVENTION;  Surgeon: Wellington Hampshire, MD;  Location: [place] [facility];  Service: Cardiovascular;  Laterality: N/A;   INTRAVASCULAR PRESSURE WIRE/FFR STUDY N/A 01/29/2019   Procedure: INTRAVASCULAR PRESSURE WIRE/FFR STUDY;  Surgeon: Wellington Hampshire, MD;  Location: [place] [facility];  Service: Cardiovascular;  Laterality: N/A;   LEFT HEART CATH AND CORONARY ANGIOGRAPHY N/A 01/29/2019   Procedure: LEFT HEART CATH AND CORONARY ANGIOGRAPHY;  Surgeon: Wellington Hampshire, MD;  Location: [place] CV LAB;  Service:  Cardiovascular;  Laterality: N/A;     Home Medications:  Prior to Admission medications   Medication Sig Start Date End Date Taking? Authorizing Provider  aspirin EC 81 MG tablet Take 1 tablet (81 mg total) by mouth daily. Swallow whole. 12/01/20  Yes Satira Sark, MD  Carboxymethylcellulose Sodium (DRY EYE RELIEF OP) Apply 1 drop to eye as needed (dry eye).   Yes [provider]  Cholecalciferol (VITAMIN D3 PO) Take 1 tablet by mouth daily.   Yes [provider]  metoprolol succinate (TOPROL-XL) 25 MG 24 hr tablet Take 1 tablet (25 mg total) by mouth daily. 02/06/19  Yes Verta Ellen., NP  Multiple Vitamin (MULTIVITAMIN) tablet Take 1 tablet by mouth daily.   Yes [provider]  rosuvastatin (CRESTOR) 10 MG tablet Take 1 tablet (10 mg total) by mouth daily. 01/11/21  Yes Satira Sark, MD  nitroGLYCERIN (NITROSTAT) 0.4 MG SL tablet Place 1 tablet (0.4 mg total) under the tongue every 5 (five) minutes x 3 doses as needed for chest pain. 05/21/20   Satira Sark, MD    Inpatient Medications: Scheduled Meds:   stroke: mapping our early stages of recovery book   Does not apply Once   aspirin EC  81 mg Oral Daily   metoprolol succinate  25 mg Oral Daily   Rivaroxaban  15 mg Oral Q supper   rosuvastatin  10 mg Oral Daily   Continuous Infusions:  PRN Meds: acetaminophen **OR** acetaminophen (TYLENOL) oral liquid 160 mg/5 mL **OR** acetaminophen, polyvinyl alcohol  Allergies:   No Known Allergies  Social History:   Social History   Socioeconomic History   Marital status: Married    Spouse name: Not on file   Number of children: Not on file   Years of education: Not on file   Highest education level: Not on file  Occupational History   Not on file  Tobacco Use   Smoking status: Never   Smokeless tobacco: Never  Vaping Use   Vaping Use: Never used  Substance and Sexual Activity   Alcohol use: Never   Drug use: Never   Sexual  activity: Not on file  Other Topics Concern   Not on file  Social History Narrative   ** Merged History Encounter **       Social Determinants of Health   Financial Resource Strain: Not on file  Food Insecurity: Not on file  Transportation Needs: Not on file  Physical Activity: Not on file  Stress: Not on file  Social Connections: Not on file  Intimate Partner Violence: Not on file    Family History:    Family History  Problem Relation Age of Onset   Hypertension Father      ROS:  Please see the history of present illness.   All other ROS reviewed and negative.     Physical Exam/Data:   Vitals:   03/08/21 0300 03/08/21 0600 03/08/21 0700 03/08/21 0758  BP: (!) 112/57  113/63 127/60   Pulse: (!) 52 (!) 57 62   Resp: 15 13 19    Temp:  98.1 F (36.7 C)  98.6 F (37 C)  TempSrc:  Oral  Oral  SpO2: 94% 93% 98%   Weight:      Height:        Intake/Output Summary (Last 24 hours) at 03/08/2021 0826 Last data filed at 03/07/2021 1957 Gross per 24 hour  Intake --  Output 850 ml  Net -850 ml   Last 3 Weights 03/07/2021 12/01/2020 05/21/2020  Weight (lbs) 127 lb 130 lb 9.6 oz 126 lb  Weight (kg) 57.607 kg 59.24 kg 57.153 kg     Body mass index is 24 kg/m.  General: Well developed, well nourished lively elderly WF, in no acute distress. Head: Normocephalic, atraumatic, sclera non-icteric, no xanthomas, nares are without discharge. Neck: Negative for carotid bruits. JVP not elevated. Lungs: Clear bilaterally to auscultation without wheezes, rales, or rhonchi. Breathing is unlabored. Heart: RRR S1 S2 without murmurs, rubs, or gallops.  Abdomen: Soft, non-tender, non-distended with normoactive bowel sounds. No rebound/guarding. Extremities: No clubbing or cyanosis. No edema. Distal pedal pulses are 2+ and equal bilaterally. Neuro: Alert and oriented X 3. Moves all extremities spontaneously. Psych:  Responds to questions appropriately with a normal affect.   EKG:  The  EKG was personally reviewed and demonstrates:  challenging tracing given baseline wander, but suspect atrial flutter with variable block 98bpm, also with LAFB, TWI I, avL and poor R wave progression Telemetry:  Telemetry was personally reviewed and demonstrates:  described above  Relevant CV Studies: Cath 2021 The left ventricular systolic function is normal. LV end diastolic pressure is normal. The left ventricular ejection fraction is 55-65% by visual estimate. 3rd RPL lesion is 90% stenosed. Prox LAD lesion is 70% stenosed. Post intervention, there is a 0% residual stenosis. A drug-eluting stent was successfully placed using a STENT RESOLUTE ONYX 2.5X15.   1.  Significant one-vessel coronary artery disease.  There is 70% stenosis in the proximal LAD which was significant by DFR at 0.88.  There is also significant stenosis in a small RPL3 supplying a small territory and thus this is unlikely to be the culprit. 2.  Normal LV systolic function and left ventricular end-diastolic pressure. 3.  Successful angioplasty and drug-eluting stent placement to the proximal LAD.   Recommendations: Dual antiplatelet therapy for at least 12 months.  Aggressive treatment of risk factors. Likely discharge home tomorrow.  2D echo 01/2019  1. Left ventricular ejection fraction, by visual estimation, is 55 to  60%. The left ventricle has normal function. There is moderately increased  left ventricular hypertrophy.   2. The left ventricle has no regional wall motion abnormalities.   3. Global right ventricle has normal systolic function.The right  ventricular size is normal. No increase in right ventricular wall  thickness.   4. Left atrial size was normal.   5. Right atrial size was normal.   6. The mitral valve is normal in structure. No evidence of mitral valve  regurgitation. No evidence of mitral stenosis.   7. The tricuspid valve is normal in structure.   8. The tricuspid valve is normal in  structure. Tricuspid valve  regurgitation is not demonstrated.   9. The aortic valve is normal in structure. Aortic valve regurgitation is  not visualized. Mild aortic valve sclerosis without stenosis.  10. Pulmonic regurgitation is mild.  11. The pulmonic valve was normal in structure. Pulmonic valve  regurgitation  is mild.  12. The inferior vena cava is normal in size with greater than 50%  respiratory variability, suggesting right atrial pressure of 3 mmHg.  13. The average left ventricular global longitudinal strain is -9.7 %.   Laboratory Data:  High Sensitivity Troponin:  No results for input(s): TROPONINIHS in the last 720 hours.   Chemistry Recent Labs  Lab 03/07/21 1631 03/08/21 0345  NA 141 138  K 4.0 3.9  CL 103 105  CO2 25 23  GLUCOSE 102* 93  BUN 21 20  CREATININE 0.90 0.81  CALCIUM 9.7 9.1  GFRNONAA >60 >60  ANIONGAP 13 10    Recent Labs  Lab 03/08/21 0345  PROT 6.6  ALBUMIN 3.7  AST 20  ALT 13  ALKPHOS 47  BILITOT 0.8   Lipids  Recent Labs  Lab 03/08/21 0345  CHOL 145  TRIG 70  HDL 60  LDLCALC 71  CHOLHDL 2.4    Hematology Recent Labs  Lab 03/07/21 1631  WBC 7.0  RBC 5.15*  HGB 15.9*  HCT 49.1*  MCV 95.3  MCH 30.9  MCHC 32.4  RDW 12.9  PLT 223   Thyroid No results for input(s): TSH, FREET4 in the last 168 hours.  BNPNo results for input(s): BNP, PROBNP in the last 168 hours.  DDimer No results for input(s): DDIMER in the last 168 hours.   Radiology/Studies:  CT Head Wo Contrast  Result Date: 03/07/2021 CLINICAL DATA:  TIA, contusion EXAM: CT HEAD WITHOUT CONTRAST TECHNIQUE: Contiguous axial images were obtained from the base of the skull through the vertex without intravenous contrast. RADIATION DOSE REDUCTION: This exam was performed according to the departmental dose-optimization program which includes automated exposure control, adjustment of the mA and/or kV according to patient size and/or use of iterative reconstruction  technique. COMPARISON:  CT head 11/29/2005 FINDINGS: Brain: No acute intracranial hemorrhage, edema, or herniation. No extra-axial fluid collections. No evidence of acute territorial infarct. No hydrocephalus. Mild patchy hypodensities in the periventricular and subcortical white matter, likely secondary to chronic microvascular ischemic changes. Approximately 2.2 x 0.9 cm extra-axial hypodensity in the anterior right middle cranial fossa which is chronic and likely represents an arachnoid cyst. Vascular: No hyperdense vessel or unexpected calcification. Skull: Normal. Negative for fracture or focal lesion. Sinuses/Orbits: No acute finding. Other: None. IMPRESSION: Chronic changes as described with no acute intracranial process identified. Electronically Signed   By: Ofilia Neas M.D.   On: 03/07/2021 16:57   MR BRAIN WO CONTRAST  Result Date: 03/07/2021 CLINICAL DATA:  Transient ischemic attack EXAM: MRI HEAD WITHOUT CONTRAST TECHNIQUE: Multiplanar, multiecho pulse sequences of the brain and surrounding structures were obtained without intravenous contrast. COMPARISON:  None. FINDINGS: Brain: No acute infarct, mass effect or extra-axial collection. No acute or chronic hemorrhage. There is multifocal hyperintense T2-weighted signal within the white matter. Generalized volume loss without a clear lobar predilection. The midline structures are normal. Vascular: Major flow voids are preserved. Skull and upper cervical spine: Normal calvarium and skull base. Visualized upper cervical spine and soft tissues are normal. Sinuses/Orbits:No paranasal sinus fluid levels or advanced mucosal thickening. No mastoid or middle ear effusion. Normal orbits. IMPRESSION: 1. No acute intracranial abnormality. 2. Findings of chronic small vessel ischemia and generalized volume loss. Electronically Signed   By: Ulyses Jarred M.D.   On: 03/07/2021 18:57     Assessment and Plan:   1. Suspected TIA - possibly embolic 2/2  #2 - echo and carotids pending  2. Newly recognized  paroxysmal atrial flutter - initial EKG somewhat challenging to discern but telemetry more readily showed paroxysms of atrial flutter with 2:1 conduction occasionally breaking into variable conduction where flutter waves are more apparently -> currently in NSR with HR 60s - continue Toprol 25mg  daily - will discuss [place] and rhythm strategy with MD  - started on Xarelto in ED last night (CrCl 66ml/min), aside from neuro symptoms, has been asymptomatic with arrhythmia - CHADSVASC calculated at 7 - ordered a measured weight for complete accuracy of [facility] dosing - add TSH, Mg to labs - echo pending  3. Essential HTN - labile this admission initially, but current BP is normal at 127/60 (only on metoprolol at home)  4. CAD with NSTEMI/PCI to LAD 2021 - no recent anginal sx - will review plan for ASA with MD given h/o easy bruising - continue BB - consider statin titration to decrease LDL to <55 given TIA, CAD (on rosuva 10mg  daily with LDL 71)   Risk Assessment/Risk Scores:          CHA2DS2-VASc Score = 7   This indicates a 11.2% annual risk of stroke. The patient's score is based upon: CHF History: 0 HTN History: 1 Diabetes History: 0 Stroke History: 2 Vascular Disease History: 1 Age Score: 2 Gender Score: 1         For questions or updates, please contact [place] Please consult [email or web address] for contact info under    Signed, Charlie Pitter, PA-C  03/08/2021 8:26 AM  Patient seen and discussed with PA Dunn, I agree with her documentation. 81 yo female history of CAD with DES to LAD in Jan 2021, HTN, HL admitted with TIA symptoms. During evaluation new diagnosis of aflutter, cardiology consulted to assist with management. She denies any palpitaitons.    K 4 Cr 0.90 BUN 21 LDL 71 TSH pending COVID neg flu neg EKG aflutter CT head no acute process MRI: no acute findings. Chronic small vessel ishcemia   81 yo  female presents with TIA, found to be in aflutter which is a new diagnosis for her. Her aflutter is asymptomatic. Has already self converted back to SR.  She is already on toprol 25mg  daily at home, started on xarelto, transition to eliquis 2.5mg  bid based on age and weight. I do not see strong indciation for both DOAC and ASA but defer to neuro. Some low rates at times in SR, would not titrate beta blocker at this time, monitor rhythm and rates overnight.  Would increase crestor to 20mg  daily for further LDL lowering.   Carlyle Dolly MD

## 2021-03-08 NOTE — Progress Notes (Signed)
PROGRESS NOTE  Theresa Norman [id number] DOB: [birth year] DOA: 03/07/2021 PCP: Celene Squibb, MD  Brief History:  81 year old female with a history of anxiety, coronary artery disease, hypertension, hyperlipidemia presenting with an episode of word finding difficulty and confusion.  The patient had been in her usual state of health without any complaints until the early afternoon of 03/07/2021.  Around 1 PM, the patient had acute onset of "confusion."  In speaking with the patient, she stated that she knew her daughter and grandchildren were in front of her, but she was not able to get her words out to address them.  During this time, the patient also had some numbness and tingling on her upper lip and right hand.  The entire episode lasted about 1 hour before the patient returned to her normal state.  Apparently, the patient's daughter stated that the patient  would answer :"I am fun" instead of I am fine.  The patient has not been started on any new medications.  She stated that her Plavix was recently discontinued, and she was told to continue aspirin monotherapy.  The patient herself denies any facial droop, headache, visual disturbance, or focal extremity weakness.  She has not had any fevers, chills, chest pain, shortness breath, coughing, hemoptysis, nausea, vomiting or diarrhea, abdominal pain, dysuria, hematuria.  In the ED, CT of the brain was negative.  WBC 7.0, hemoglobin 15.7, platelets 223,000.  Sodium 138, potassium 3.9, bicarbonate 23, serum creatinine 0.81.  UA was negative for pyuria.  MRI of the brain was negative for acute findings.  EKG showed atrial flutter with nonspecific ST changes.     Assessment and Plan: * TIA (transient ischemic attack)- (present on admission) -Appreciate Neurology Consult -PT/OT evaluation -Speech therapy eval -CT brain--neg -MRI brain--neg for acute findings -Carotid Duplex-- -Echo-- -LDL--71 -HbA1C-- -Antiplatelet--Xarelto EEG  New  onset atrial fibrillation ([place])- (present on admission) Personally reviewed EKG--a flutter, nonspecific ST change CHADSVASc = 7 (HTN, age x 2, stroke x 2, CAD, female) Xarelto started in the ED, continue Xarelto Patient requesting cardiology consult, or consult cardiology Echo   CAD (coronary artery disease)- (present on admission) With history of NSTEMI and DES to prox LAD Jan 2021 Continue metoprolol, Crestor Patient reports that cardiology advised her to stop taking Plavix No chest pain  High cholesterol- (present on admission) Continue Crestor LDL 71  Essential hypertension- (present on admission) Continue metoprolol Blood pressure well controlled in the ED at 140s over 80s         Status is: Observation The patient remains OBS appropriate and will d/c before 2 midnights.          Family Communication:   no Family at bedside  Consultants:  cardiology, neurology  Code Status:  DNR  DVT Prophylaxis:  xarelto   Procedures: As Listed in Progress Note Above  Antibiotics: None        Subjective: Patient denies fevers, chills, headache, chest pain, dyspnea, nausea, vomiting, diarrhea, abdominal pain, dysuria, hematuria, hematochezia, and melena.   Objective: Vitals:   03/08/21 0100 03/08/21 0200 03/08/21 0300 03/08/21 0600  BP: (!) 106/58 112/61 (!) 112/57 113/63  Pulse: (!) 53 (!) 55 (!) 52 (!) 57  Resp: 17 17 15 13   Temp:    98.1 F (36.7 C)  TempSrc:    Oral  SpO2: 96% 96% 94% 93%  Weight:      Height:        Intake/Output  Summary (Last 24 hours) at 03/08/2021 0737 Last data filed at 03/07/2021 1957 Gross per 24 hour  Intake --  Output 850 ml  Net -850 ml   Weight change:  Exam:  General:  Pt is alert, follows commands appropriately, not in acute distress HEENT: No icterus, No thrush, No neck mass, [place]/AT Cardiovascular: RRR, S1/S2, no rubs, no gallops Respiratory: CTA bilaterally, no wheezing, no crackles, no rhonchi Abdomen:  Soft/+BS, non tender, non distended, no guarding Extremities: No edema, No lymphangitis, No petechiae, No rashes, no synovitis Neuro:  CN II-XII intact, strength 4/5 in RUE, RLE, strength 4/5 LUE, LLE; sensation intact bilateral; no dysmetria; babinski equivocal    Data Reviewed: I have personally reviewed following labs and imaging studies Basic Metabolic Panel: Recent Labs  Lab 03/07/21 1631 03/08/21 0345  NA 141 138  K 4.0 3.9  CL 103 105  CO2 25 23  GLUCOSE 102* 93  BUN 21 20  CREATININE 0.90 0.81  CALCIUM 9.7 9.1   Liver Function Tests: Recent Labs  Lab 03/08/21 0345  AST 20  ALT 13  ALKPHOS 47  BILITOT 0.8  PROT 6.6  ALBUMIN 3.7   No results for input(s): LIPASE, AMYLASE in the last 168 hours. No results for input(s): AMMONIA in the last 168 hours. Coagulation Profile: No results for input(s): INR, PROTIME in the last 168 hours. CBC: Recent Labs  Lab 03/07/21 1631  WBC 7.0  NEUTROABS 4.2  HGB 15.9*  HCT 49.1*  MCV 95.3  PLT 223   Cardiac Enzymes: No results for input(s): CKTOTAL, CKMB, CKMBINDEX, TROPONINI in the last 168 hours. BNP: Invalid input(s): POCBNP CBG: Recent Labs  Lab 03/07/21 1514  GLUCAP 80   HbA1C: No results for input(s): HGBA1C in the last 72 hours. Urine analysis:    Component Value Date/Time   COLORURINE YELLOW 03/07/2021 2103   APPEARANCEUR CLEAR 03/07/2021 2103   LABSPEC 1.009 03/07/2021 2103   PHURINE 7.0 03/07/2021 2103   GLUCOSEU NEGATIVE 03/07/2021 2103   HGBUR NEGATIVE 03/07/2021 2103   [place] NEGATIVE 03/07/2021 2103   KETONESUR 20 (A) 03/07/2021 2103   PROTEINUR NEGATIVE 03/07/2021 2103   NITRITE NEGATIVE 03/07/2021 2103   LEUKOCYTESUR NEGATIVE 03/07/2021 2103   Sepsis Labs: @LABRCNTIP (procalcitonin:4,lacticidven:4) ) Recent Results (from the past 240 hour(s))  Resp Panel by RT-PCR (Flu A&B, Covid) Nasopharyngeal Swab     Status: None   Collection Time: 03/07/21  8:15 PM   Specimen: Nasopharyngeal  Swab; Nasopharyngeal(NP) swabs in vial transport medium  Result Value Ref Range Status   SARS Coronavirus 2 by RT PCR NEGATIVE NEGATIVE Final    Comment: (NOTE) SARS-CoV-2 target nucleic acids are NOT DETECTED.  The SARS-CoV-2 RNA is generally detectable in upper respiratory specimens during the acute phase of infection. The lowest concentration of SARS-CoV-2 viral copies this assay can detect is 138 copies/mL. A negative result does not preclude SARS-Cov-2 infection and should not be used as the sole basis for treatment or other patient management decisions. A negative result may occur with  improper specimen collection/handling, submission of specimen other than nasopharyngeal swab, presence of viral mutation(s) within the areas targeted by this assay, and inadequate number of viral copies(<138 copies/mL). A negative result must be combined with clinical observations, patient history, and epidemiological information. The expected result is Negative.  Fact Sheet for Patients:  EntrepreneurPulse.com.au  Fact Sheet for Healthcare Providers:  IncredibleEmployment.be  This test is no t yet approved or cleared by the [place] FDA and  has been  authorized for detection and/or diagnosis of SARS-CoV-2 by FDA under an Emergency Use Authorization (EUA). This EUA will remain  in effect (meaning this test can be used) for the duration of the COVID-19 declaration under Section 564(b)(1) of the Act, 21 U.S.C.section 360bbb-3(b)(1), unless the authorization is terminated  or revoked sooner.       Influenza A by PCR NEGATIVE NEGATIVE Final   Influenza B by PCR NEGATIVE NEGATIVE Final    Comment: (NOTE) The Xpert Xpress SARS-CoV-2/FLU/RSV plus assay is intended as an aid in the diagnosis of influenza from Nasopharyngeal swab specimens and should not be used as a sole basis for treatment. Nasal washings and aspirates are unacceptable for Xpert Xpress  SARS-CoV-2/FLU/RSV testing.  Fact Sheet for Patients: EntrepreneurPulse.com.au  Fact Sheet for Healthcare Providers: IncredibleEmployment.be  This test is not yet approved or cleared by the [place] FDA and has been authorized for detection and/or diagnosis of SARS-CoV-2 by FDA under an Emergency Use Authorization (EUA). This EUA will remain in effect (meaning this test can be used) for the duration of the COVID-19 declaration under Section 564(b)(1) of the Act, 21 U.S.C. section 360bbb-3(b)(1), unless the authorization is terminated or revoked.  Performed at [facility], [address], [place],  [address]      Scheduled Meds:   stroke: mapping our early stages of recovery book   Does not apply Once   aspirin EC  81 mg Oral Daily   metoprolol succinate  25 mg Oral Daily   Rivaroxaban  15 mg Oral Q supper   rosuvastatin  10 mg Oral Daily   Continuous Infusions:  Procedures/Studies: CT Head Wo Contrast  Result Date: 03/07/2021 CLINICAL DATA:  TIA, contusion EXAM: CT HEAD WITHOUT CONTRAST TECHNIQUE: Contiguous axial images were obtained from the base of the skull through the vertex without intravenous contrast. RADIATION DOSE REDUCTION: This exam was performed according to the departmental dose-optimization program which includes automated exposure control, adjustment of the mA and/or kV according to patient size and/or use of iterative reconstruction technique. COMPARISON:  CT head 11/29/2005 FINDINGS: Brain: No acute intracranial hemorrhage, edema, or herniation. No extra-axial fluid collections. No evidence of acute territorial infarct. No hydrocephalus. Mild patchy hypodensities in the periventricular and subcortical white matter, likely secondary to chronic microvascular ischemic changes. 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 Vascular: No hyperdense vessel or unexpected calcification. Skull: Normal. Negative for fracture or focal lesion. Sinuses/Orbits: No acute finding. Other: None. IMPRESSION: Chronic changes as described with no acute intracranial process identified. Electronically Signed   By: Ofilia Neas M.D.   On: 03/07/2021 16:57   MR BRAIN WO CONTRAST  Result Date: 03/07/2021 CLINICAL DATA:  Transient ischemic attack EXAM: MRI HEAD WITHOUT CONTRAST TECHNIQUE: Multiplanar, multiecho pulse sequences of the brain and surrounding structures were obtained without intravenous contrast. COMPARISON:  None. FINDINGS: Brain: No acute infarct, mass effect or extra-axial collection. No acute or chronic hemorrhage. There is multifocal hyperintense T2-weighted signal within the white matter. Generalized volume loss without a clear lobar predilection. The midline structures are normal. Vascular: Major flow voids are preserved. Skull and upper cervical spine: Normal calvarium and skull base. Visualized upper cervical spine and soft tissues are normal. Sinuses/Orbits:No paranasal sinus fluid levels or advanced mucosal thickening. No mastoid or middle ear effusion. Normal orbits. IMPRESSION: 1. No acute intracranial abnormality. 2. Findings of chronic small vessel ischemia and generalized volume loss.  Electronically Signed   By: Ulyses Jarred M.D.   On: 03/07/2021 18:57    Orson Eva, DO  [facility]  If 7PM-7AM, please contact night-coverage [email or web address] Password [facility] 03/08/2021, 7:37 AM   LOS: 0 days

## 2021-03-08 NOTE — TOC Benefit Eligibility Note (Signed)
Patient Advocate Encounter  Insurance verification completed.    The patient is currently admitted and upon discharge could be taking Eliquis 2.5 mg.  The current 30 day co-pay is, $47.00.   The patient is insured through [employer] Medicare Part D     Cheryl Chay, CPhT Pharmacy Patient Advocate Specialist [facility] Patient Advocate Team Direct Number: [phone]  Fax: [phone]        

## 2021-03-08 NOTE — Hospital Course (Signed)
81 year old female with a history of anxiety, coronary artery disease, hypertension, hyperlipidemia presenting with an episode of word finding difficulty and confusion.  The patient had been in her usual state of health without any complaints until the early afternoon of 03/07/2021.  Around 1 PM, the patient had acute onset of "confusion."  In speaking with the patient, she stated that she knew her daughter and grandchildren were in front of her, but she was not able to get her words out to address them.  During this time, the patient also had some numbness and tingling on her upper lip and right hand.  The entire episode lasted about 1 hour before the patient returned to her normal state.  Apparently, the patient's daughter stated that the patient  would answer :"I am fun" instead of I am fine.  The patient has not been started on any new medications.  She stated that her Plavix was recently discontinued, and she was told to continue aspirin monotherapy.  The patient herself denies any facial droop, headache, visual disturbance, or focal extremity weakness.  She has not had any fevers, chills, chest pain, shortness breath, coughing, hemoptysis, nausea, vomiting or diarrhea, abdominal pain, dysuria, hematuria.  In the ED, CT of the brain was negative.  WBC 7.0, hemoglobin 15.7, platelets 223,000.  Sodium 138, potassium 3.9, bicarbonate 23, serum creatinine 0.81.  UA was negative for pyuria.  MRI of the brain was negative for acute findings.  EKG showed atrial flutter with nonspecific ST changes.

## 2021-03-09 DIAGNOSIS — G459 Transient cerebral ischemic attack, unspecified: Secondary | ICD-10-CM | POA: Diagnosis not present

## 2021-03-09 LAB — URINE CULTURE: Culture: NO GROWTH

## 2021-03-09 NOTE — Discharge Summary (Signed)
Physician Discharge Summary   Patient: Theresa MaidMolly C Cooley MRN: [id number] DOB: 03-06-1940  Admit date:     03/07/2021  Discharge date: 03/09/21  Discharge Physician: Kendell BaneSeyed A Sharra Cayabyab   PCP: Benita StabileHall, John Z, MD   Recommendations at discharge:    Recommend follow-up with neurology in 8 to 12 weeks.  F/up with PCP in 1-2 weeks   Discharge Diagnoses: Principal Problem:   TIA (transient ischemic attack) Active Problems:   Essential hypertension   High cholesterol   CAD (coronary artery disease)   New onset atrial fibrillation (HCC)  Resolved Problems:   * No resolved hospital problems. *   Hospital Course: 81 year old female with a history of anxiety, coronary artery disease, hypertension, hyperlipidemia presenting with an episode of word finding difficulty and confusion.  The patient had been in her usual state of health without any complaints until the early afternoon of 03/07/2021.  Around 1 PM, the patient had acute onset of "confusion."  In speaking with the patient, she stated that she knew her daughter and grandchildren were in front of her, but she was not able to get her words out to address them.  During this time, the patient also had some numbness and tingling on her upper lip and right hand.  The entire episode lasted about 1 hour before the patient returned to her normal state.  Apparently, the patient's daughter stated that the patient  would answer :"I am fun" instead of I am fine.  The patient has not been started on any new medications.  She stated that her Plavix was recently discontinued, and she was told to continue aspirin monotherapy.  The patient herself denies any facial droop, headache, visual disturbance, or focal extremity weakness.  She has not had any fevers, chills, chest pain, shortness breath, coughing, hemoptysis, nausea, vomiting or diarrhea, abdominal pain, dysuria, hematuria.  In the ED, CT of the brain was negative.  WBC 7.0, hemoglobin 15.7, platelets 223,000.   Sodium 138, potassium 3.9, bicarbonate 23, serum creatinine 0.81.  UA was negative for pyuria.  MRI of the brain was negative for acute findings.  EKG showed atrial flutter with nonspecific ST changes.  Assessment and Plan: * TIA (transient ischemic attack)- (present on admission) -Appreciate Neurology Consult -PT/OT evaluation -Speech therapy eval -CT brain--neg -MRI brain--neg for acute findings -Carotid Duplex-- -Echo-- -LDL--71 -HbA1C-- -Antiplatelet--Xarelto EEG  New onset atrial fibrillation (HCC)- (present on admission) Personally reviewed EKG--a flutter, nonspecific ST change CHADSVASc = 7 (HTN, age x 2, stroke x 2, CAD, female) Xarelto started in the ED, continue Xarelto Patient requesting cardiology consult, or consult cardiology Echo   CAD (coronary artery disease)- (present on admission) With history of NSTEMI and DES to prox LAD Jan 2021 Continue metoprolol, Crestor Patient reports that cardiology advised her to stop taking Plavix No chest pain  High cholesterol- (present on admission) Continue Crestor LDL 71  Essential hypertension- (present on admission) Continue metoprolol Blood pressure well controlled in the ED at 140s over 80s    Consultants: Neurp Procedures performed: Head scans , EEG  Disposition: Home Diet recommendation:  Discharge Diet Orders (From admission, onward)     Start     Ordered   03/09/21 0000  Diet - low sodium heart healthy        03/09/21 0830           Cardiac diet  DISCHARGE MEDICATION: Allergies as of 03/09/2021   No Known Allergies      Medication List  STOP taking these medications    nitroGLYCERIN 0.4 MG SL tablet Commonly known as: NITROSTAT       TAKE these medications    apixaban 2.5 MG Tabs tablet Commonly known as: ELIQUIS Take 1 tablet (2.5 mg total) by mouth 2 (two) times daily.   aspirin EC 81 MG tablet Take 1 tablet (81 mg total) by mouth daily. Swallow whole.   DRY EYE RELIEF  OP Apply 1 drop to eye as needed (dry eye).   metoprolol succinate 25 MG 24 hr tablet Commonly known as: TOPROL-XL Take 1 tablet (25 mg total) by mouth daily.   multivitamin tablet Take 1 tablet by mouth daily.   rosuvastatin 20 MG tablet Commonly known as: CRESTOR Take 1 tablet (20 mg total) by mouth daily. What changed:  medication strength how much to take   VITAMIN D3 PO Take 1 tablet by mouth daily.         Discharge Exam: Filed Weights   03/07/21 1512 03/08/21 0821 03/08/21 1317  Weight: 57.6 kg 57.6 kg 57.4 kg     Physical Exam:   General:  Alert, oriented, cooperative, no distress;   HEENT:  Normocephalic, PERRL, otherwise with in Normal limits   Neuro:  CNII-XII intact. , normal motor and sensation, reflexes intact   Lungs:   Clear to auscultation BL, Respirations unlabored, no wheezes / crackles  Cardio:    S1/S2, RRR, No murmure, No Rubs or Gallops   Abdomen:   Soft, non-tender, bowel sounds active all four quadrants,  no guarding or peritoneal signs.  Muscular skeletal:  Limited exam - in bed, able to move all 4 extremities, Normal strength,  2+ pulses,  symmetric, No pitting edema  Skin:  Dry, warm to touch, negative for any Rashes,  Wounds: Please see nursing documentation          Condition at discharge: good  The results of significant diagnostics from this hospitalization (including imaging, microbiology, ancillary and laboratory) are listed below for reference.   Imaging Studies: CT Head Wo Contrast  Result Date: 03/07/2021 CLINICAL DATA:  TIA, contusion EXAM: CT HEAD WITHOUT CONTRAST TECHNIQUE: Contiguous axial images were obtained from the base of the skull through the vertex without intravenous contrast. RADIATION DOSE REDUCTION: This exam was performed according to the departmental dose-optimization program which includes automated exposure control, adjustment of the mA and/or kV according to patient size and/or use of iterative  reconstruction technique. COMPARISON:  CT head 11/29/2005 FINDINGS: Brain: No acute intracranial hemorrhage, edema, or herniation. No extra-axial fluid collections. No evidence of acute territorial infarct. No hydrocephalus. Mild patchy hypodensities in the periventricular and subcortical white matter, likely secondary to chronic microvascular ischemic changes. Approximately 2.2 x 0.9 cm extra-axial hypodensity in the anterior right middle cranial fossa which is chronic and likely represents an arachnoid cyst. Vascular: No hyperdense vessel or unexpected calcification. Skull: Normal. Negative for fracture or focal lesion. Sinuses/Orbits: No acute finding. Other: None. IMPRESSION: Chronic changes as described with no acute intracranial process identified. Electronically Signed   By: Jannifer Hickelaney  Williams M.D.   On: 03/07/2021 16:57   MR ANGIO HEAD WO CONTRAST  Result Date: 03/08/2021 CLINICAL DATA:  Stroke follow-up.  Weakness and dizziness. EXAM: MRA HEAD WITHOUT CONTRAST TECHNIQUE: Angiographic images of the Circle of Willis were acquired using MRA technique without intravenous contrast. COMPARISON:  No pertinent prior exam. FINDINGS: Anterior circulation: The internal carotid arteries are patent from skull base to carotid termini. Bilateral ICA signal loss in the anterior genu  and paraclinoid regions is likely artifactual related to vessel orientation and adjacent bone and aerated sinus, however this precludes assessment for stenosis. ACAs and MCAs are patent without evidence of a proximal branch occlusion. There are moderate to severe stenoses versus artifact in both proximal A1 segments and moderate stenoses versus artifact in the proximal left M1 and proximal left M2 segments. Signal loss in both distal A1 segments is likely artifactual. No aneurysm is identified. Posterior circulation: The included distal portions of the vertebral arteries are patent to the basilar with the right being strongly dominant.  Patent SCA origins are seen bilaterally. The basilar artery is widely patent. Both PCAs are patent without evidence of a significant proximal stenosis. No aneurysm is identified. Anatomic variants: None. IMPRESSION: 1. No large or medium vessel occlusion. 2. ACA and MCA stenoses versus artifact as above. Electronically Signed   By: Sebastian Ache M.D.   On: 03/08/2021 12:15   MR BRAIN WO CONTRAST  Result Date: 03/07/2021 CLINICAL DATA:  Transient ischemic attack EXAM: MRI HEAD WITHOUT CONTRAST TECHNIQUE: Multiplanar, multiecho pulse sequences of the brain and surrounding structures were obtained without intravenous contrast. COMPARISON:  None. FINDINGS: Brain: No acute infarct, mass effect or extra-axial collection. No acute or chronic hemorrhage. There is multifocal hyperintense T2-weighted signal within the white matter. Generalized volume loss without a clear lobar predilection. The midline structures are normal. Vascular: Major flow voids are preserved. Skull and upper cervical spine: Normal calvarium and skull base. Visualized upper cervical spine and soft tissues are normal. Sinuses/Orbits:No paranasal sinus fluid levels or advanced mucosal thickening. No mastoid or middle ear effusion. Normal orbits. IMPRESSION: 1. No acute intracranial abnormality. 2. Findings of chronic small vessel ischemia and generalized volume loss. Electronically Signed   By: Deatra Robinson M.D.   On: 03/07/2021 18:57   US Carotid Bilateral (at [facility] and [facility] only)  Result Date: 03/08/2021 CLINICAL DATA:  81 year old female with history of TIA. EXAM: BILATERAL CAROTID DUPLEX ULTRASOUND TECHNIQUE: Wallace Cullens scale imaging, color Doppler and duplex ultrasound were performed of bilateral carotid and vertebral arteries in the neck. COMPARISON:  11/29/2005 FINDINGS: Criteria: Quantification of carotid stenosis is based on velocity parameters that correlate the residual internal carotid diameter with NASCET-based stenosis levels, using the  diameter of the distal internal carotid lumen as the denominator for stenosis measurement. The following velocity measurements were obtained: RIGHT ICA: Peak systolic velocity 92 (previously 60) cm/sec, End diastolic velocity 21 cm/sec CCA: Peak systolic velocity 96 cm/sec SYSTOLIC ICA/CCA RATIO:  1.0, previously 0.68 ECA: Peak systolic velocity 58 cm/sec LEFT ICA: Peak systolic velocity 144 (previously 85) cm/sec, End diastolic velocity 35 cm/sec CCA: 77 cm/sec SYSTOLIC ICA/CCA RATIO:  1.9, previously 1.74 ECA: 79 cm/sec RIGHT CAROTID ARTERY: Mild multifocal atherosclerotic plaque formation, most prominent about the carotid bulb. Mild significant tortuosity. Normal low resistance waveforms. RIGHT VERTEBRAL ARTERY:  Antegrade flow. LEFT CAROTID ARTERY: Moderate multifocal atherosclerotic plaque formation, most prominent about the carotid bulb and proximal ICA. Mild significant tortuosity. Normal low resistance waveforms. LEFT VERTEBRAL ARTERY:  Antegrade flow. Upper extremity non-invasive blood pressures: Not obtained. IMPRESSION: 1. Right carotid artery system: Less than 50% stenosis secondary to mild multifocal atherosclerotic plaque formation. 2. Left carotid artery system: 50-69% stenosis secondary to moderate multifocal atherosclerotic plaque formation, most prominent about the carotid bulb and proximal ICA. 3.  Vertebral artery system: Patent with antegrade flow bilaterally. Marliss Coots, MD [facility] Electronically Signed   By: Jae Dire.D.  On: 03/08/2021 12:17   EEG adult  Result Date: 03/08/2021 Charlsie Quest, MD     03/08/2021  6:39 PM Patient Name: Theresa Norman MRN: [id number] Epilepsy Attending: Charlsie Quest Referring Physician/Provider: Catarina Hartshorn, MD Date: 03/08/2021 Duration: 23.45 mins Patient history:  81 year old female with transient speech disturbance. EEG to evaluate for seizure Level of alertness: Awake, asleep  AEDs during EEG study: None Technical aspects: This EEG study was done with scalp electrodes positioned according to the 10-20 International system of electrode placement. Electrical activity was acquired at a sampling rate of 500Hz  and reviewed with a high frequency filter of 70Hz  and a low frequency filter of 1Hz . EEG data were recorded continuously and digitally stored. Description: The posterior dominant rhythm consists of 9 Hz activity of moderate voltage (25-35 uV) seen predominantly in posterior head regions, symmetric and reactive to eye opening and eye closing. Sleep was characterized by vertex waves, sleep spindles (12 to 14 Hz), maximal frontocentral region. Hyperventilation and photic stimulation were not performed.   IMPRESSION: This study is within normal limits. No seizures or epileptiform discharges were seen throughout the recording.   ECHOCARDIOGRAM COMPLETE  Result Date: 03/08/2021    ECHOCARDIOGRAM REPORT   Patient Name:   NAKEYIA MENDEN Date of Exam: 03/08/2021 Medical Rec #:  03/10/2021       Height:       61.0 in Accession #:    Theresa Norman      Weight:       127.0 lb Date of Birth:  [birth year]       BSA:          1.557 m Patient Age:    80 years        BP:           127/60 mmHg Patient Gender: F               HR:           62 bpm. Exam Location:  [id number] Procedure: 2D Echo, Cardiac Doppler and Color Doppler Indications:    Atrial Fibrillation  History:        Patient has prior history of Echocardiogram examinations.                 Previous Myocardial Infarction and CAD, TIA, Arrythmias:Atrial                 Fibrillation; Risk Factors:Hypertension.  Sonographer:    [id number] Referring Phys: 04/07/1940 ASIA B ZIERLE-GHOSH IMPRESSIONS  1. Left ventricular ejection fraction, by estimation, is 60 to 65%. The left ventricle has normal function. The left ventricle has no regional wall motion abnormalities. There is mild left ventricular hypertrophy. Left ventricular  diastolic parameters are indeterminate.  2. Right ventricular systolic function is normal. The right ventricular size is normal. There is normal pulmonary artery systolic pressure.  3. The mitral valve is abnormal. Mild mitral valve regurgitation. No evidence of mitral stenosis.  4. The aortic valve is tricuspid. There is mild calcification of the aortic valve. There is mild thickening of the aortic valve. Aortic valve regurgitation is not visualized. No aortic stenosis is present.  5. The inferior vena cava is normal in size with greater than 50% respiratory variability, suggesting right atrial pressure of 3 mmHg. FINDINGS  Left Ventricle: Left ventricular ejection fraction, by estimation, is 60 to 65%. The left ventricle has normal function. The left ventricle has no regional wall motion abnormalities. The left  ventricular internal cavity size was normal in size. There is  mild left ventricular hypertrophy. Left ventricular diastolic parameters are indeterminate. Right Ventricle: The right ventricular size is normal. No increase in right ventricular wall thickness. Right ventricular systolic function is normal. There is normal pulmonary artery systolic pressure. The tricuspid regurgitant velocity is 2.51 m/s, and  with an assumed right atrial pressure of 8 mmHg, the estimated right ventricular systolic pressure is 33.2 mmHg. Left Atrium: Left atrial size was normal in size. Right Atrium: Right atrial size was normal in size. Pericardium: There is no evidence of pericardial effusion. Mitral Valve: The mitral valve is abnormal. There is mild thickening of the mitral valve leaflet(s). There is mild calcification of the mitral valve leaflet(s). Mild mitral annular calcification. Mild mitral valve regurgitation. No evidence of mitral valve stenosis. MV peak gradient, 2.0 mmHg. The mean mitral valve gradient is 1.0 mmHg. Tricuspid Valve: The tricuspid valve is normal in structure. Tricuspid valve regurgitation is  trivial. No evidence of tricuspid stenosis. Aortic Valve: The aortic valve is tricuspid. There is mild calcification of the aortic valve. There is mild thickening of the aortic valve. There is mild aortic valve annular calcification. Aortic valve regurgitation is not visualized. No aortic stenosis  is present. Aortic valve mean gradient measures 2.0 mmHg. Aortic valve peak gradient measures 4.8 mmHg. Aortic valve area, by VTI measures 2.03 cm. Pulmonic Valve: The pulmonic valve was not well visualized. Pulmonic valve regurgitation is not visualized. No evidence of pulmonic stenosis. Aorta: The aortic root is normal in size and structure. Venous: The inferior vena cava is normal in size with greater than 50% respiratory variability, suggesting right atrial pressure of 3 mmHg. IAS/Shunts: No atrial level shunt detected by color flow Doppler.  LEFT VENTRICLE PLAX 2D LVIDd:         4.60 cm     Diastology LVIDs:         2.90 cm     LV e' medial:    3.53 cm/s LV PW:         1.20 cm     LV E/e' medial:  17.3 LV IVS:        1.20 cm     LV e' lateral:   7.13 cm/s LVOT diam:     1.90 cm     LV E/e' lateral: 8.6 LV SV:         53 LV SV Index:   34 LVOT Area:     2.84 cm  LV Volumes (MOD) LV vol d, MOD A2C: 25.7 ml LV vol d, MOD A4C: 25.9 ml LV vol s, MOD A2C: 11.8 ml LV vol s, MOD A4C: 7.8 ml LV SV MOD A2C:     13.9 ml LV SV MOD A4C:     25.9 ml LV SV MOD BP:      16.6 ml RIGHT VENTRICLE RV Basal diam:  2.85 cm RV Mid diam:    2.70 cm RV S prime:     13.30 cm/s TAPSE (M-mode): 2.8 cm LEFT ATRIUM             Index        RIGHT ATRIUM           Index LA diam:        4.20 cm 2.70 cm/m   RA Area:     11.70 cm LA Vol (A2C):   42.8 ml 27.48 ml/m  RA Volume:   22.70 ml  14.58 ml/m LA Vol (A4C):  40.1 ml 25.75 ml/m LA Biplane Vol: 41.3 ml 26.52 ml/m  AORTIC VALVE                    PULMONIC VALVE AV Area (Vmax):    2.07 cm     PV Vmax:       0.76 m/s AV Area (Vmean):   2.10 cm     PV Peak grad:  2.3 mmHg AV Area (VTI):      2.03 cm AV Vmax:           110.00 cm/s AV Vmean:          67.900 cm/s AV VTI:            0.261 m AV Peak Grad:      4.8 mmHg AV Mean Grad:      2.0 mmHg LVOT Vmax:         80.20 cm/s LVOT Vmean:        50.300 cm/s LVOT VTI:          0.187 m LVOT/AV VTI ratio: 0.72  AORTA Ao Root diam: 2.70 cm Ao Asc diam:  2.90 cm MITRAL VALVE               TRICUSPID VALVE MV Area (PHT): 2.90 cm    TR Peak grad:   25.2 mmHg MV Area VTI:   2.29 cm    TR Vmax:        251.00 cm/s MV Peak grad:  2.0 mmHg MV Mean grad:  1.0 mmHg    SHUNTS MV Vmax:       0.71 m/s    Systemic VTI:  0.19 m MV Vmean:      38.0 cm/s   Systemic Diam: 1.90 cm MV Decel Time: 262 msec MV E velocity: 61.00 cm/s MV A velocity: 41.90 cm/s MV E/A ratio:  1.46 Dina Rich MD Electronically signed by Dina Rich MD Signature Date/Time: 03/08/2021/12:48:58 PM    Final     Microbiology: Results for orders placed or performed during the hospital encounter of 03/07/21  Resp Panel by RT-PCR (Flu A&B, Covid) Nasopharyngeal Swab     Status: None   Collection Time: 03/07/21  8:15 PM   Specimen: Nasopharyngeal Swab; Nasopharyngeal(NP) swabs in vial transport medium  Result Value Ref Range Status   SARS Coronavirus 2 by RT PCR NEGATIVE NEGATIVE Final    Comment: (NOTE) SARS-CoV-2 target nucleic acids are NOT DETECTED.  The SARS-CoV-2 RNA is generally detectable in upper respiratory specimens during the acute phase of infection. The lowest concentration of SARS-CoV-2 viral copies this assay can detect is 138 copies/mL. A negative result does not preclude SARS-Cov-2 infection and should not be used as the sole basis for treatment or other patient management decisions. A negative result may occur with  improper specimen collection/handling, submission of specimen other than nasopharyngeal swab, presence of viral mutation(s) within the areas targeted by this assay, and inadequate number of viral copies(<138 copies/mL). A negative result must be  combined with clinical observations, patient history, and epidemiological information. The expected result is Negative.  Fact Sheet for Patients:  BloggerCourse.com  Fact Sheet for Healthcare Providers:  SeriousBroker.it  This test is no t yet approved or cleared by the [place] FDA and  has been authorized for detection and/or diagnosis of SARS-CoV-2 by FDA under an Emergency Use Authorization (EUA). This EUA will remain  in effect (meaning this test can be used) for the duration of the COVID-19 declaration under  Section 564(b)(1) of the Act, 21 U.S.C.section 360bbb-3(b)(1), unless the authorization is terminated  or revoked sooner.       Influenza A by PCR NEGATIVE NEGATIVE Final   Influenza B by PCR NEGATIVE NEGATIVE Final    Comment: (NOTE) The Xpert Xpress SARS-CoV-2/FLU/RSV plus assay is intended as an aid in the diagnosis of influenza from Nasopharyngeal swab specimens and should not be used as a sole basis for treatment. Nasal washings and aspirates are unacceptable for Xpert Xpress SARS-CoV-2/FLU/RSV testing.  Fact Sheet for Patients: BloggerCourse.com  Fact Sheet for Healthcare Providers: SeriousBroker.it  This test is not yet approved or cleared by the [place] FDA and has been authorized for detection and/or diagnosis of SARS-CoV-2 by FDA under an Emergency Use Authorization (EUA). This EUA will remain in effect (meaning this test can be used) for the duration of the COVID-19 declaration under Section 564(b)(1) of the Act, 21 U.S.C. section 360bbb-3(b)(1), unless the authorization is terminated or revoked.  Performed at [facility], [address] [place], [place] [address]   Urine Culture     Status: None   Collection Time: 03/07/21  9:03 PM   Specimen: Urine, Clean Catch  Result Value Ref Range Status   Specimen Description   Final    URINE,  CLEAN CATCH Performed at [facility], [address] [place], [place] [address]    Special Requests   Final    NONE Performed at [facility], [address], [place], [place] [address]    Culture   Final    NO GROWTH Performed at [facility], [address] [place], [place] [address]    Report Status 03/09/2021 FINAL  Final    Labs: CBC: Recent Labs  Lab 03/07/21 1631  WBC 7.0  NEUTROABS 4.2  HGB 15.9*  HCT 49.1*  MCV 95.3  PLT 223   Basic Metabolic Panel: Recent Labs  Lab 03/07/21 1631 03/08/21 0345  NA 141 138  K 4.0 3.9  CL 103 105  CO2 25 23  GLUCOSE 102* 93  BUN 21 20  CREATININE 0.90 0.81  CALCIUM 9.7 9.1  MG  --  2.3   Liver Function Tests: Recent Labs  Lab 03/08/21 0345  AST 20  ALT 13  ALKPHOS 47  BILITOT 0.8  PROT 6.6  ALBUMIN 3.7   CBG: Recent Labs  Lab 03/07/21 1514  GLUCAP 80    Discharge time spent: greater than 30 minutes.  Signed: Kendell Bane, MD [facility] 03/09/2021

## 2021-03-09 NOTE — Plan of Care (Signed)
?  Problem: Education: ?Goal: Knowledge of General Education information will improve ?Description: Including pain rating scale, medication(s)/side effects and non-pharmacologic comfort measures ?Outcome: Adequate for Discharge ?  ?Problem: Health Behavior/Discharge Planning: ?Goal: Ability to manage health-related needs will improve ?Outcome: Adequate for Discharge ?  ?Problem: Clinical Measurements: ?Goal: Ability to maintain clinical measurements within normal limits will improve ?Outcome: Adequate for Discharge ?Goal: Will remain free from infection ?Outcome: Adequate for Discharge ?Goal: Diagnostic test results will improve ?Outcome: Adequate for Discharge ?Goal: Respiratory complications will improve ?Outcome: Adequate for Discharge ?Goal: Cardiovascular complication will be avoided ?Outcome: Adequate for Discharge ?  ?Problem: Activity: ?Goal: Risk for activity intolerance will decrease ?Outcome: Adequate for Discharge ?  ?Problem: Nutrition: ?Goal: Adequate nutrition will be maintained ?Outcome: Adequate for Discharge ?  ?Problem: Coping: ?Goal: Level of anxiety will decrease ?Outcome: Adequate for Discharge ?  ?Problem: Elimination: ?Goal: Will not experience complications related to bowel motility ?Outcome: Adequate for Discharge ?Goal: Will not experience complications related to urinary retention ?Outcome: Adequate for Discharge ?  ?Problem: Pain Managment: ?Goal: General experience of comfort will improve ?Outcome: Adequate for Discharge ?  ?Problem: Safety: ?Goal: Ability to remain free from injury will improve ?Outcome: Adequate for Discharge ?  ?Problem: Skin Integrity: ?Goal: Risk for impaired skin integrity will decrease ?Outcome: Adequate for Discharge ?  ?Problem: Education: ?Goal: Knowledge of disease or condition will improve ?Outcome: Adequate for Discharge ?Goal: Knowledge of secondary prevention will improve (SELECT ALL) ?Outcome: Adequate for Discharge ?Goal: Knowledge of patient specific  risk factors will improve (INDIVIDUALIZE FOR PATIENT) ?Outcome: Adequate for Discharge ?Goal: Individualized Educational Video(s) ?Outcome: Adequate for Discharge ?  ?Problem: Coping: ?Goal: Will verbalize positive feelings about self ?Outcome: Adequate for Discharge ?Goal: Will identify appropriate support needs ?Outcome: Adequate for Discharge ?  ?Problem: Health Behavior/Discharge Planning: ?Goal: Ability to manage health-related needs will improve ?Outcome: Adequate for Discharge ?  ?Problem: Self-Care: ?Goal: Ability to participate in self-care as condition permits will improve ?Outcome: Adequate for Discharge ?Goal: Ability to communicate needs accurately will improve ?Outcome: Adequate for Discharge ?  ?Problem: Ischemic Stroke/TIA Tissue Perfusion: ?Goal: Complications of ischemic stroke/TIA will be minimized ?Outcome: Adequate for Discharge ?  ?

## 2021-03-09 NOTE — Progress Notes (Signed)
IV removed from R wrist due to discharge. Pt tolerated well.   ?

## 2021-03-13 NOTE — ED Provider Notes (Signed)
? MEDICAL SURGICAL UNIT ?Provider Note ? ? ?CSN: [id number] ?Arrival date & time: 03/07/21  1509 ? ?  ? ?History ? ?Chief Complaint  ?Patient presents with  ? Altered Mental Status  ? ? ?Theresa Norman is a 81 y.o. female. ? ?Patient presents to ER chief complaint of sudden onset word finding difficulty, confusion, nonsensical speech.  Symptoms occurred just prior to arrival, per patient and lasted about 10 minutes.  However family states that her lasted much longer closer to an hour.  Symptoms otherwise resolved and she is back to baseline.  She has no complaints of headache or chest pain or abdominal pain.  No fever cough vomiting or diarrhea.  Has no new weakness or numbness per patient. ? ? ?  ? ?Home Medications ?Prior to Admission medications   ?Medication Sig Start Date End Date Taking? Authorizing Provider  ?aspirin EC 81 MG tablet Take 1 tablet (81 mg total) by mouth daily. Swallow whole. 12/01/20  Yes Satira Sark, MD  ?Carboxymethylcellulose Sodium (DRY EYE RELIEF OP) Apply 1 drop to eye as needed (dry eye).   Yes [provider]  ?Cholecalciferol (VITAMIN D3 PO) Take 1 tablet by mouth daily.   Yes [provider]  ?metoprolol succinate (TOPROL-XL) 25 MG 24 hr tablet Take 1 tablet (25 mg total) by mouth daily. 02/06/19  Yes Verta Ellen., NP  ?Multiple Vitamin (MULTIVITAMIN) tablet Take 1 tablet by mouth daily.   Yes [provider]  ?apixaban (ELIQUIS) 2.5 MG TABS tablet Take 1 tablet (2.5 mg total) by mouth 2 (two) times daily. 03/08/21   Orson Eva, MD  ?rosuvastatin (CRESTOR) 20 MG tablet Take 1 tablet (20 mg total) by mouth daily. 03/09/21   Orson Eva, MD  ?   ? ?Allergies    ?Patient has no known allergies.   ? ?Review of Systems   ?Review of Systems  ?Constitutional:  Negative for fever.  ?HENT:  Negative for ear pain.   ?Eyes:  Negative for pain.  ?Respiratory:  Negative for cough.   ?Cardiovascular:  Negative for chest pain.  ?Gastrointestinal:   Negative for abdominal pain.  ?Genitourinary:  Negative for flank pain.  ?Musculoskeletal:  Negative for back pain.  ?Skin:  Negative for rash.  ?Neurological:  Negative for headaches.  ? ?Physical Exam ?Updated Vital Signs ?BP 138/71 (BP Location: Left Arm)   Pulse 72   Temp 97.8 ?F (36.6 ?C) (Oral)   Resp 16   Ht 5\' 1"  (1.549 m)   Wt 57.4 kg   SpO2 95%   BMI 23.91 kg/m?  ?Physical Exam ?Constitutional:   ?   General: She is not in acute distress. ?   Appearance: Normal appearance.  ?HENT:  ?   Head: Normocephalic.  ?   Nose: Nose normal.  ?Eyes:  ?   Extraocular Movements: Extraocular movements intact.  ?Cardiovascular:  ?   Rate and Rhythm: Normal rate.  ?Pulmonary:  ?   Effort: Pulmonary effort is normal.  ?Musculoskeletal:     ?   General: Normal range of motion.  ?   Cervical back: Normal range of motion.  ?Neurological:  ?   General: No focal deficit present.  ?   Mental Status: She is alert and oriented to person, place, and time. Mental status is at baseline.  ?   Cranial Nerves: No cranial nerve deficit.  ?   Motor: No weakness.  ? ? ?ED Results / Procedures / Treatments   ?Labs ?(  all labs ordered are listed, but only abnormal results are displayed) ?Labs Reviewed  ?CBC WITH DIFFERENTIAL/PLATELET - Abnormal; Notable for the following components:  ?    Result Value  ? RBC 5.15 (*)   ? Hemoglobin 15.9 (*)   ? HCT 49.1 (*)   ? All other components within normal limits  ?BASIC METABOLIC PANEL - Abnormal; Notable for the following components:  ? Glucose, Bld 102 (*)   ? All other components within normal limits  ?URINALYSIS, ROUTINE W REFLEX MICROSCOPIC - Abnormal; Notable for the following components:  ? Ketones, ur 20 (*)   ? All other components within normal limits  ?RESP PANEL BY RT-PCR (FLU A&B, COVID) ARPGX2  ?URINE CULTURE  ?HEMOGLOBIN A1C  ?LIPID PANEL  ?COMPREHENSIVE METABOLIC PANEL  ?TSH  ?MAGNESIUM  ?CBG MONITORING, ED  ? ? ?EKG ?EKG Interpretation ? ?Date/Time:  Monday March 07 2021  15:14:38 EST ?Ventricular Rate:  98 ?PR Interval:    ?QRS Duration: 107 ?QT Interval:  378 ?QTC Calculation: 483 ?R Axis:   -46 ?Text Interpretation: Atrial flutter with predominant 3:1 AV block Left anterior fascicular block Abnormal R-wave progression, late transition LVH with secondary repolarization abnormality Confirmed by Thamas Jaegers ([id number]) on 03/07/2021 5:21:43 PM ? ?Radiology ?No results found. ? ?Procedures ?Procedures  ? ? ?Medications Ordered in ED ?Medications  ?Rivaroxaban (XARELTO) tablet 15 mg (15 mg Oral Given 03/07/21 1835)  ? ? ?ED Course/ Medical Decision Making/ A&P ?  ?                        ?Medical Decision Making ?Problems Addressed: ?TIA (transient ischemic attack): acute illness or injury ? ?Amount and/or Complexity of Data Reviewed ?Independent Historian: caregiver ?   Details: History also obtained from the daughter. ?External Data Reviewed: notes. ?   Details: Chart review shows outpatient cardiology visit last month. ?Labs: ordered. ?   Details: CBC chemistry within normal limits. ?Radiology: ordered. ?   Details: CT imaging of the brain pursued with no acute findings. ?ECG/medicine tests: ordered. Decision-making details documented in ED Course. ? ?Risk ?Prescription drug management. ?Decision regarding hospitalization. ?Risk Details: Concern for TIA appears symptoms have resolved.  MRI of the brain pursued.  In addition to the hospitalist for TIA. ? ? ? ? ? ? ? ? ? ? ?Final Clinical Impression(s) / ED Diagnoses ?Final diagnoses:  ?TIA (transient ischemic attack)  ? ? ?Rx / DC Orders ?ED Discharge Orders   ? ?      Ordered  ?  rosuvastatin (CRESTOR) 20 MG tablet  Daily       ? 03/08/21 1802  ?  Discharge instructions       ?Comments: Recommend follow-up with neurology in 8 to 12 weeks.  ? 03/09/21 0830  ?  Activity as tolerated - No restrictions       ? 03/09/21 0830  ?  Increase activity slowly       ? 03/09/21 0830  ?  Diet - low sodium heart healthy       ? 03/09/21 0830  ?   apixaban (ELIQUIS) 2.5 MG TABS tablet  2 times daily       ? 03/08/21 1802  ? ?  ?  ? ?  ? ? ?  ?Luna Fuse, MD ?03/13/21 0013 ? ?

## 2021-03-15 DIAGNOSIS — D6859 Other primary thrombophilia: Secondary | ICD-10-CM | POA: Diagnosis not present

## 2021-03-15 DIAGNOSIS — Z8673 Personal history of transient ischemic attack (TIA), and cerebral infarction without residual deficits: Secondary | ICD-10-CM | POA: Diagnosis not present

## 2021-03-15 DIAGNOSIS — I10 Essential (primary) hypertension: Secondary | ICD-10-CM | POA: Diagnosis not present

## 2021-03-15 DIAGNOSIS — I4892 Unspecified atrial flutter: Secondary | ICD-10-CM | POA: Diagnosis not present

## 2021-03-15 DIAGNOSIS — E782 Mixed hyperlipidemia: Secondary | ICD-10-CM | POA: Diagnosis not present

## 2021-03-15 DIAGNOSIS — I700 Atherosclerosis of aorta: Secondary | ICD-10-CM | POA: Diagnosis not present

## 2021-03-17 DIAGNOSIS — M1712 Unilateral primary osteoarthritis, left knee: Secondary | ICD-10-CM | POA: Diagnosis not present

## 2021-03-17 NOTE — Progress Notes (Signed)
Cardiology Office Note    Date:  03/18/2021   ID:  Theresa Norman, DOB 1940-08-30, MRN [id number]  PCP:  Benita StabileHall, John Z, MD  Cardiologist: Nona DellSamuel McDowell, MD    Chief Complaint  Patient presents with   Hospitalization Follow-up    History of Present Illness:    Theresa Norman is a 81 y.o. female with past medical history of CAD (s/p NSTEMI in 01/2019 with DES to LAD), HTN, HLD and arthritis who presents to the office today for hospital follow-up.  She most recently presented to [facility] ED on 03/07/2021 after having an episode of aphasia and Head CT and Brain MRI were unremarkable. She was admitted for further evaluation given suspicion of a TIA. Echocardiogram showed a preserved EF of 60 to 65% with no regional motion abnormalities. Carotid Dopplers showed less than 50% stenosis along the RICA but was noted to have 50 to 69% stenosis along her LICA. Cardiology was consulted as she was noted to have episodes of paroxysmal atrial flutter and would spontaneously convert back to normal sinus rhythm. She was on Toprol-XL 25 mg daily at home and this was continued. Was started on Eliquis 2.5 mg twice daily for anticoagulation and Neurology recommended continuing ASA 81 mg daily as well. Crestor was titrated to 20 mg daily (LDL 71 during admission).  In talking with the patient today, she reports overall doing well since her recent admission. No recurrent neurological issues. She did follow-up with her PCP but has not yet been referred to a Neurologist and [place] to [place] in [place]. She denies any recent chest pain or palpitations. No recent orthopnea, PND or pitting edema.   Past Medical History:  Diagnosis Date   Anxiety    Arthritis    CAD (coronary artery disease), native coronary artery    DES to proximal LAD January 2021   Essential hypertension    Immune deficiency disorder [facility](HCC)    Mixed hyperlipidemia    NSTEMI (non-ST elevated myocardial infarction) [facility](HCC)    January  2021   Paroxysmal atrial flutter [facility](HCC)     Past Surgical History:  Procedure Laterality Date   CATARACT EXTRACTION W/PHACO Right 11/07/2016   Procedure: CATARACT EXTRACTION PHACO AND INTRAOCULAR LENS PLACEMENT RIGHT EYE;  Surgeon: Gemma PayorHunt, Kerry, MD;  Location: [facility] ORS;  Service: Ophthalmology;  Laterality: Right;  CDE: 13.30   CATARACT EXTRACTION W/PHACO Left 11/20/2016   Procedure: CATARACT EXTRACTION PHACO AND INTRAOCULAR LENS PLACEMENT (IOC);  Surgeon: Gemma PayorHunt, Kerry, MD;  Location: [facility] ORS;  Service: Ophthalmology;  Laterality: Left;  CDE: 20.00   CORONARY STENT INTERVENTION N/A 01/29/2019   Procedure: CORONARY STENT INTERVENTION;  Surgeon: Iran OuchArida, Muhammad A, MD;  Location: [facility] INVASIVE CV LAB;  Service: Cardiovascular;  Laterality: N/A;   INTRAVASCULAR PRESSURE WIRE/FFR STUDY N/A 01/29/2019   Procedure: INTRAVASCULAR PRESSURE WIRE/FFR STUDY;  Surgeon: Iran OuchArida, Muhammad A, MD;  Location: [facility] INVASIVE CV LAB;  Service: Cardiovascular;  Laterality: N/A;   LEFT HEART CATH AND CORONARY ANGIOGRAPHY N/A 01/29/2019   Procedure: LEFT HEART CATH AND CORONARY ANGIOGRAPHY;  Surgeon: Iran OuchArida, Muhammad A, MD;  Location: [facility] INVASIVE CV LAB;  Service: Cardiovascular;  Laterality: N/A;    Current Medications: Outpatient Medications Prior to Visit  Medication Sig Dispense Refill   Carboxymethylcellulose Sodium (DRY EYE RELIEF OP) Apply 1 drop to eye as needed (dry eye).     Cholecalciferol (VITAMIN D3 PO) Take 1 tablet by mouth daily.     metoprolol succinate (TOPROL-XL) 25 MG 24 hr tablet  Take 1 tablet (25 mg total) by mouth daily. 90 tablet 3   Multiple Vitamin (MULTIVITAMIN) tablet Take 1 tablet by mouth daily.     rosuvastatin (CRESTOR) 20 MG tablet Take 1 tablet (20 mg total) by mouth daily. 30 tablet 1   apixaban (ELIQUIS) 2.5 MG TABS tablet Take 1 tablet (2.5 mg total) by mouth 2 (two) times daily. 60 tablet 1   aspirin EC 81 MG tablet Take 1 tablet (81 mg total) by mouth daily. Swallow whole. 90 tablet 3    lisinopril (ZESTRIL) 5 MG tablet Take 5 mg by mouth daily.     No facility-administered medications prior to visit.     Allergies:   Patient has no known allergies.   Social History   Socioeconomic History   Marital status: Married    Spouse name: Not on file   Number of children: Not on file   Years of education: Not on file   Highest education level: Not on file  Occupational History   Not on file  Tobacco Use   Smoking status: Never   Smokeless tobacco: Never  Vaping Use   Vaping Use: Never used  Substance and Sexual Activity   Alcohol use: Never   Drug use: Never   Sexual activity: Not on file  Other Topics Concern   Not on file  Social History Narrative   ** Merged History Encounter **       Social Determinants of Health   Financial Resource Strain: Not on file  Food Insecurity: Not on file  Transportation Needs: Not on file  Physical Activity: Not on file  Stress: Not on file  Social Connections: Not on file     Family History:  The patient's family history includes Hypertension in her father.   Review of Systems:    Please see the history of present illness.     All other systems reviewed and are otherwise negative except as noted above.   Physical Exam:    VS:  BP 128/72    Pulse 76    Ht 5\' 1"  (1.549 m)    Wt 129 lb 9.6 oz (58.8 kg)    SpO2 97%    BMI 24.49 kg/m    General: Well developed, well nourished,female appearing in no acute distress. Head: Normocephalic, atraumatic. Neck: No carotid bruits. JVD not elevated.  Lungs: Respirations regular and unlabored, without wheezes or rales.  Heart: Regular rate and rhythm. No S3 or S4.  No murmur, no rubs, or gallops appreciated. Abdomen: Appears non-distended. No obvious abdominal masses. Msk:  Strength and tone appear normal for age. No obvious joint deformities or effusions. Extremities: No clubbing or cyanosis. No pitting edema.  Distal pedal pulses are 2+ bilaterally. Neuro: Alert and oriented X  3. Moves all extremities spontaneously. No focal deficits noted. Psych:  Responds to questions appropriately with a normal affect. Skin: No rashes or lesions noted  Wt Readings from Last 3 Encounters:  03/18/21 129 lb 9.6 oz (58.8 kg)  03/08/21 126 lb 8.7 oz (57.4 kg)  12/01/20 130 lb 9.6 oz (59.2 kg)     Studies/Labs Reviewed:   EKG:  EKG is not ordered today.   Recent Labs: 03/07/2021: Hemoglobin 15.9; Platelets 223 03/08/2021: ALT 13; BUN 20; Creatinine, Ser 0.81; Magnesium 2.3; Potassium 3.9; Sodium 138; TSH 3.854   Lipid Panel    Component Value Date/Time   CHOL 145 03/08/2021 0345   TRIG 70 03/08/2021 0345   HDL 60 03/08/2021 0345  CHOLHDL 2.4 03/08/2021 0345   VLDL 14 03/08/2021 0345   LDLCALC 71 03/08/2021 0345    Additional studies/ records that were reviewed today include:   LHC: 01/2019 The left ventricular systolic function is normal. LV end diastolic pressure is normal. The left ventricular ejection fraction is 55-65% by visual estimate. 3rd RPL lesion is 90% stenosed. Prox LAD lesion is 70% stenosed. Post intervention, there is a 0% residual stenosis. A drug-eluting stent was successfully placed using a STENT RESOLUTE ONYX 2.5X15.   1.  Significant one-vessel coronary artery disease.  There is 70% stenosis in the proximal LAD which was significant by DFR at 0.88.  There is also significant stenosis in a small RPL3 supplying a small territory and thus this is unlikely to be the culprit. 2.  Normal LV systolic function and left ventricular end-diastolic pressure. 3.  Successful angioplasty and drug-eluting stent placement to the proximal LAD.   Recommendations: Dual antiplatelet therapy for at least 12 months.  Aggressive treatment of risk factors. Likely discharge home tomorrow.    Echocardiogram: 02/2021 IMPRESSIONS     1. Left ventricular ejection fraction, by estimation, is 60 to 65%. The  left ventricle has normal function. The left ventricle has  no regional  wall motion abnormalities. There is mild left ventricular hypertrophy.  Left ventricular diastolic parameters  are indeterminate.   2. Right ventricular systolic function is normal. The right ventricular  size is normal. There is normal pulmonary artery systolic pressure.   3. The mitral valve is abnormal. Mild mitral valve regurgitation. No  evidence of mitral stenosis.   4. The aortic valve is tricuspid. There is mild calcification of the  aortic valve. There is mild thickening of the aortic valve. Aortic valve  regurgitation is not visualized. No aortic stenosis is present.   5. The inferior vena cava is normal in size with greater than 50%  respiratory variability, suggesting right atrial pressure of 3 mmHg.   Carotid Dopplers: 02/2021 IMPRESSION: 1. Right carotid artery system: Less than 50% stenosis secondary to mild multifocal atherosclerotic plaque formation.   2. Left carotid artery system: 50-69% stenosis secondary to moderate multifocal atherosclerotic plaque formation, most prominent about the carotid bulb and proximal ICA.   3.  Vertebral artery system: Patent with antegrade flow bilaterally.  Assessment:    1. Coronary artery disease involving native coronary artery of native heart without angina pectoris   2. Paroxysmal atrial flutter (HCC)   3. Hyperlipidemia LDL goal <70   4. TIA (transient ischemic attack)   5. Bilateral carotid artery stenosis      Plan:   In order of problems listed above:  1. CAD - She is s/p NSTEMI in 01/2019 with DES to LAD. She denies any recent anginal symptoms and echocardiogram during admission showed a preserved EF with no regional WMA.  - Continue Toprol-XL 25mg  daily and Crestor 20mg  daily. She remains on ASA for now but this can be stopped from a cardiac perspective since she is now on Eliquis. Will defer continuation to Neurology given her recent TIA.   2. Paroxysmal Atrial Flutter - She denies any recent  palpitations but was asymptomatic with her arrhythmia during admission. She is in NSR by examination today. Continue Toprol-XL 25mg  daily for rate-control. - No reports of active bleeding. Continue Eliquis 2.5mg  BID for anticoagulation which is the appropriate dose given her age of 56 and weight at 129 lbs.   3. HLD - FLP during recent admission showed LDL at 71  and Crestor was titrated to 20mg  daily. Will plan for a repeat FLP in 3 months.   4. TIA - Symptoms leading to her recent admission were felt to be secondary to a TIA as imaging was unrevealing at that time. Will refer to Neurology for outpatient follow-up. By review of Neurology notes, she will continue with both ASA 81mg  daily and Eliquis until her follow-up visit.   5. Carotid Artery Stenosis - Recent dopplers showed less than 50% stenosis along the RICA but was noted to have 50 to 69% stenosis along her LICA. Reviewed with the patient today and would plan for repeat imaging in 1 year. Continue statin therapy.    Medication Adjustments/Labs and Tests Ordered: Current medicines are reviewed at length with the patient today.  Concerns regarding medicines are outlined above.  Medication changes, Labs and Tests ordered today are listed in the Patient Instructions below. Patient Instructions  Medication Instructions:   Continue current medication regimen. Continue Aspirin until you follow-up with Neurology. You have been referred to Neurology.   *If you need a refill on your cardiac medications before your next appointment, please call your pharmacy*   Lab Work:  Recheck Lipids in 3 months with [place] or your PCP.    Follow-Up: At [facility], you and your health needs are our priority.  As part of our continuing mission to provide you with exceptional heart care, we have created designated Provider Care Teams.  These Care Teams include your primary Cardiologist (physician) and Advanced Practice Providers (APPs -  Physician  Assistants and Nurse Practitioners) who all work together to provide you with the care you need, when you need it.  We recommend signing up for the patient portal called "MyChart".  Sign up information is provided on this After Visit Summary.  MyChart is used to connect with patients for Virtual Visits (Telemedicine).  Patients are able to view lab/test results, encounter notes, upcoming appointments, etc.  Non-urgent messages can be sent to your provider as well.   To learn more about what you can do with MyChart, go to [email or web address].    Your next appointment:   6 month(s)  The format for your next appointment:   In Person  Provider:   You may see Rozann Lesches, MD or one of the following Advanced Practice Providers on your designated Care Team:   Bernerd Pho, PA-C  Ermalinda Barrios, PA-C {     Signed, Waynetta Pean  03/18/2021 8:03 PM    [facility] [address] [place], [place] [address] Phone: [phone] Fax: [phone]

## 2021-03-18 ENCOUNTER — Other Ambulatory Visit: Payer: Self-pay

## 2021-03-18 ENCOUNTER — Encounter: Payer: Self-pay | Admitting: Student

## 2021-03-18 ENCOUNTER — Ambulatory Visit: Payer: Medicare Other | Admitting: Student

## 2021-03-18 VITALS — BP 128/72 | HR 76 | Ht 61.0 in | Wt 129.6 lb

## 2021-03-18 DIAGNOSIS — I2510 Atherosclerotic heart disease of native coronary artery without angina pectoris: Secondary | ICD-10-CM

## 2021-03-18 DIAGNOSIS — I6523 Occlusion and stenosis of bilateral carotid arteries: Secondary | ICD-10-CM

## 2021-03-18 DIAGNOSIS — E785 Hyperlipidemia, unspecified: Secondary | ICD-10-CM

## 2021-03-18 DIAGNOSIS — G459 Transient cerebral ischemic attack, unspecified: Secondary | ICD-10-CM

## 2021-03-18 DIAGNOSIS — I4892 Unspecified atrial flutter: Secondary | ICD-10-CM

## 2021-03-18 MED ORDER — APIXABAN 2.5 MG PO TABS: 2.5000 mg | ORAL_TABLET | Freq: Two times a day (BID) | ORAL | 11 refills | Status: DC

## 2021-03-18 MED ORDER — ASPIRIN EC 81 MG PO TBEC: 81.0000 mg | DELAYED_RELEASE_TABLET | Freq: Every day | ORAL | 3 refills | Status: AC

## 2021-03-18 NOTE — Patient Instructions (Signed)
Medication Instructions:  ? ?Continue current medication regimen. Continue Aspirin until you follow-up with Neurology. You have been referred to Neurology.  ? ?*If you need a refill on your cardiac medications before your next appointment, please call your pharmacy* ? ? ?Lab Work: ? ?Recheck Lipids in 3 months with [place] or your PCP.  ? ? ?Follow-Up: ?At [facility], you and your health needs are our priority.  As part of our continuing mission to provide you with exceptional heart care, we have created designated Provider Care Teams.  These Care Teams include your primary Cardiologist (physician) and Advanced Practice Providers (APPs -  Physician Assistants and Nurse Practitioners) who all work together to provide you with the care you need, when you need it. ? ?We recommend signing up for the patient portal called "MyChart".  Sign up information is provided on this After Visit Summary.  MyChart is used to connect with patients for Virtual Visits (Telemedicine).  Patients are able to view lab/test results, encounter notes, upcoming appointments, etc.  Non-urgent messages can be sent to your provider as well.   ?To learn more about what you can do with MyChart, go to [email or web address].   ? ?Your next appointment:   ?6 month(s) ? ?The format for your next appointment:   ?In Person ? ?Provider:   ?You may see Nona Dell, MD or one of the following Advanced Practice Providers on your designated Care Team:   ?Randall An, PA-C  ?Jacolyn Reedy, PA-C { ? ? ?

## 2021-03-22 DIAGNOSIS — I10 Essential (primary) hypertension: Secondary | ICD-10-CM | POA: Diagnosis not present

## 2021-05-08 DIAGNOSIS — E782 Mixed hyperlipidemia: Secondary | ICD-10-CM | POA: Diagnosis not present

## 2021-05-08 DIAGNOSIS — I129 Hypertensive chronic kidney disease with stage 1 through stage 4 chronic kidney disease, or unspecified chronic kidney disease: Secondary | ICD-10-CM | POA: Diagnosis not present

## 2021-05-08 DIAGNOSIS — N182 Chronic kidney disease, stage 2 (mild): Secondary | ICD-10-CM | POA: Diagnosis not present

## 2021-05-08 DIAGNOSIS — I2510 Atherosclerotic heart disease of native coronary artery without angina pectoris: Secondary | ICD-10-CM | POA: Diagnosis not present

## 2021-05-25 DIAGNOSIS — Z79899 Other long term (current) drug therapy: Secondary | ICD-10-CM | POA: Diagnosis not present

## 2021-05-25 DIAGNOSIS — I10 Essential (primary) hypertension: Secondary | ICD-10-CM | POA: Diagnosis not present

## 2021-05-25 DIAGNOSIS — G451 Carotid artery syndrome (hemispheric): Secondary | ICD-10-CM | POA: Diagnosis not present

## 2021-05-25 DIAGNOSIS — I482 Chronic atrial fibrillation, unspecified: Secondary | ICD-10-CM | POA: Diagnosis not present

## 2021-06-08 DIAGNOSIS — I129 Hypertensive chronic kidney disease with stage 1 through stage 4 chronic kidney disease, or unspecified chronic kidney disease: Secondary | ICD-10-CM | POA: Diagnosis not present

## 2021-06-08 DIAGNOSIS — I2510 Atherosclerotic heart disease of native coronary artery without angina pectoris: Secondary | ICD-10-CM | POA: Diagnosis not present

## 2021-06-08 DIAGNOSIS — E782 Mixed hyperlipidemia: Secondary | ICD-10-CM | POA: Diagnosis not present

## 2021-06-08 DIAGNOSIS — N182 Chronic kidney disease, stage 2 (mild): Secondary | ICD-10-CM | POA: Diagnosis not present

## 2021-07-05 DIAGNOSIS — E782 Mixed hyperlipidemia: Secondary | ICD-10-CM | POA: Diagnosis not present

## 2021-07-05 DIAGNOSIS — R946 Abnormal results of thyroid function studies: Secondary | ICD-10-CM | POA: Diagnosis not present

## 2021-07-05 DIAGNOSIS — R7301 Impaired fasting glucose: Secondary | ICD-10-CM | POA: Diagnosis not present

## 2021-07-27 ENCOUNTER — Encounter: Payer: Self-pay | Admitting: Internal Medicine

## 2021-07-27 DIAGNOSIS — M25562 Pain in left knee: Secondary | ICD-10-CM | POA: Diagnosis not present

## 2021-07-27 DIAGNOSIS — R946 Abnormal results of thyroid function studies: Secondary | ICD-10-CM | POA: Diagnosis not present

## 2021-07-27 DIAGNOSIS — E559 Vitamin D deficiency, unspecified: Secondary | ICD-10-CM | POA: Diagnosis not present

## 2021-07-27 DIAGNOSIS — R7401 Elevation of levels of liver transaminase levels: Secondary | ICD-10-CM | POA: Diagnosis not present

## 2021-07-27 DIAGNOSIS — I2510 Atherosclerotic heart disease of native coronary artery without angina pectoris: Secondary | ICD-10-CM | POA: Diagnosis not present

## 2021-07-27 DIAGNOSIS — K5909 Other constipation: Secondary | ICD-10-CM | POA: Diagnosis not present

## 2021-07-27 DIAGNOSIS — E782 Mixed hyperlipidemia: Secondary | ICD-10-CM | POA: Diagnosis not present

## 2021-07-27 DIAGNOSIS — I700 Atherosclerosis of aorta: Secondary | ICD-10-CM | POA: Diagnosis not present

## 2021-07-27 DIAGNOSIS — I10 Essential (primary) hypertension: Secondary | ICD-10-CM | POA: Diagnosis not present

## 2021-07-27 DIAGNOSIS — R7301 Impaired fasting glucose: Secondary | ICD-10-CM | POA: Diagnosis not present

## 2021-07-27 DIAGNOSIS — Z8673 Personal history of transient ischemic attack (TIA), and cerebral infarction without residual deficits: Secondary | ICD-10-CM | POA: Diagnosis not present

## 2021-07-27 DIAGNOSIS — N182 Chronic kidney disease, stage 2 (mild): Secondary | ICD-10-CM | POA: Diagnosis not present

## 2021-08-08 DIAGNOSIS — I2510 Atherosclerotic heart disease of native coronary artery without angina pectoris: Secondary | ICD-10-CM | POA: Diagnosis not present

## 2021-08-08 DIAGNOSIS — I129 Hypertensive chronic kidney disease with stage 1 through stage 4 chronic kidney disease, or unspecified chronic kidney disease: Secondary | ICD-10-CM | POA: Diagnosis not present

## 2021-08-08 DIAGNOSIS — N182 Chronic kidney disease, stage 2 (mild): Secondary | ICD-10-CM | POA: Diagnosis not present

## 2021-08-08 DIAGNOSIS — E782 Mixed hyperlipidemia: Secondary | ICD-10-CM | POA: Diagnosis not present

## 2021-09-08 DIAGNOSIS — I2510 Atherosclerotic heart disease of native coronary artery without angina pectoris: Secondary | ICD-10-CM | POA: Diagnosis not present

## 2021-09-08 DIAGNOSIS — E782 Mixed hyperlipidemia: Secondary | ICD-10-CM | POA: Diagnosis not present

## 2021-09-08 DIAGNOSIS — I129 Hypertensive chronic kidney disease with stage 1 through stage 4 chronic kidney disease, or unspecified chronic kidney disease: Secondary | ICD-10-CM | POA: Diagnosis not present

## 2021-09-08 DIAGNOSIS — N182 Chronic kidney disease, stage 2 (mild): Secondary | ICD-10-CM | POA: Diagnosis not present

## 2021-10-03 NOTE — Progress Notes (Deleted)
Cardiology Office Note  Date: 10/03/2021   ID: Theresa, Norman [birth year], MRN [id number]  PCP:  Benita Stabile, MD  Cardiologist:  Nona Dell, MD Electrophysiologist:  None   No chief complaint on file.   History of Present Illness: Theresa Norman is an 81 y.o. female last seen in March by Ms. Strader PA-C, I reviewed the note.  LDL was 61 in June.  Past Medical History:  Diagnosis Date   Anxiety    Arthritis    CAD (coronary artery disease), native coronary artery    DES to proximal LAD January 2021   Essential hypertension    Immune deficiency disorder [facility])    Mixed hyperlipidemia    NSTEMI (non-ST elevated myocardial infarction) [facility])    January 2021   Paroxysmal atrial flutter [facility])     Past Surgical History:  Procedure Laterality Date   CATARACT EXTRACTION W/PHACO Right 11/07/2016   Procedure: CATARACT EXTRACTION PHACO AND INTRAOCULAR LENS PLACEMENT RIGHT EYE;  Surgeon: Gemma Payor, MD;  Location: [facility];  Service: Ophthalmology;  Laterality: Right;  CDE: 13.30   CATARACT EXTRACTION W/PHACO Left 11/20/2016   Procedure: CATARACT EXTRACTION PHACO AND INTRAOCULAR LENS PLACEMENT (IOC);  Surgeon: Gemma Payor, MD;  Location: [facility] ORS;  Service: Ophthalmology;  Laterality: Left;  CDE: 20.00   CORONARY STENT INTERVENTION N/A 01/29/2019   Procedure: CORONARY STENT INTERVENTION;  Surgeon: Iran Ouch, MD;  [facility];  Service: Cardiovascular;  Laterality: N/A;   INTRAVASCULAR PRESSURE WIRE/FFR STUDY N/A 01/29/2019   Procedure: INTRAVASCULAR PRESSURE WIRE/FFR STUDY;  Surgeon: Iran Ouch, MD;  Location: [facility];  Service: Cardiovascular;  Laterality: N/A;   LEFT HEART CATH AND CORONARY ANGIOGRAPHY N/A 01/29/2019   Procedure: LEFT HEART CATH AND CORONARY ANGIOGRAPHY;  Surgeon: Iran Ouch, MD;  [facility]: [facility];  Service: Cardiovascular;  Laterality: N/A;    Current Outpatient Medications  Medication Sig  Dispense Refill   apixaban (ELIQUIS) 2.5 MG TABS tablet Take 1 tablet (2.5 mg total) by mouth 2 (two) times daily. 60 tablet 11   aspirin EC 81 MG tablet Take 1 tablet (81 mg total) by mouth daily. Swallow whole. 90 tablet 3   Carboxymethylcellulose Sodium (DRY EYE RELIEF OP) Apply 1 drop to eye as needed (dry eye).     Cholecalciferol (VITAMIN D3 PO) Take 1 tablet by mouth daily.     metoprolol succinate (TOPROL-XL) 25 MG 24 hr tablet Take 1 tablet (25 mg total) by mouth daily. 90 tablet 3   Multiple Vitamin (MULTIVITAMIN) tablet Take 1 tablet by mouth daily.     rosuvastatin (CRESTOR) 20 MG tablet Take 1 tablet (20 mg total) by mouth daily. 30 tablet 1   No current facility-administered medications for this visit.   Allergies:  Patient has no known allergies.   Social History: The patient  reports that she has never smoked. She has never used smokeless tobacco. She reports that she does not drink alcohol and does not use drugs.   Family History: The patient's family history includes Hypertension in her father.   ROS:  Please see the history of present illness. Otherwise, complete review of systems is positive for {NONE DEFAULTED:18576}.  All other systems are reviewed and negative.   Physical Exam: VS:  There were no vitals taken for this visit., BMI There is no height or weight on file to calculate BMI.  Wt Readings from Last 3 Encounters:  03/18/21 129 lb  9.6 oz (58.8 kg)  03/08/21 126 lb 8.7 oz (57.4 kg)  12/01/20 130 lb 9.6 oz (59.2 kg)    General: Patient appears comfortable at rest. HEENT: Conjunctiva and lids normal, oropharynx clear with moist mucosa. Neck: Supple, no elevated JVP or carotid bruits, no thyromegaly. Lungs: Clear to auscultation, nonlabored breathing at rest. Cardiac: Regular rate and rhythm, no S3 or significant systolic murmur, no pericardial rub. Abdomen: Soft, nontender, no hepatomegaly, bowel sounds present, no guarding or rebound. Extremities: No  pitting edema, distal pulses 2+. Skin: Warm and dry. Musculoskeletal: No kyphosis. Neuropsychiatric: Alert and oriented x3, affect grossly appropriate.  ECG:  An ECG dated 03/07/2021 was personally reviewed today and demonstrated:  Probable atrial flutter with variable conduction and controlled heart rate, left anterior fascicular block.  Recent Labwork: 03/07/2021: Hemoglobin 15.9; Platelets 223 03/08/2021: ALT 13; AST 20; BUN 20; Creatinine, Ser 0.81; Magnesium 2.3; Potassium 3.9; Sodium 138; TSH 3.854     Component Value Date/Time   CHOL 145 03/08/2021 0345   TRIG 70 03/08/2021 0345   HDL 60 03/08/2021 0345   CHOLHDL 2.4 03/08/2021 0345   VLDL 14 03/08/2021 0345   LDLCALC 71 03/08/2021 0345  June 2023: Hemoglobin A1c 5.7%, cholesterol 149, triglycerides 97, HDL 70, LDL 61, BUN 14, creatinine 0.92, potassium 5.3, AST 20, ALT 15, hemoglobin 15.5, platelets 214, TSH 3.72  Other Studies Reviewed Today:  Carotid Dopplers 03/08/2021: IMPRESSION: 1. Right carotid artery system: Less than 50% stenosis secondary to mild multifocal atherosclerotic plaque formation.   2. Left carotid artery system: 50-69% stenosis secondary to moderate multifocal atherosclerotic plaque formation, most prominent about the carotid bulb and proximal ICA.   3.  Vertebral artery system: Patent with antegrade flow bilaterally.  Echocardiogram 03/08/2021:  1. Left ventricular ejection fraction, by estimation, is 60 to 65%. The  left ventricle has normal function. The left ventricle has no regional  wall motion abnormalities. There is mild left ventricular hypertrophy.  Left ventricular diastolic parameters  are indeterminate.   2. Right ventricular systolic function is normal. The right ventricular  size is normal. There is normal pulmonary artery systolic pressure.   3. The mitral valve is abnormal. Mild mitral valve regurgitation. No  evidence of mitral stenosis.   4. The aortic valve is tricuspid. There is  mild calcification of the  aortic valve. There is mild thickening of the aortic valve. Aortic valve  regurgitation is not visualized. No aortic stenosis is present.   5. The inferior vena cava is normal in size with greater than 50%  respiratory variability, suggesting right atrial pressure of 3 mmHg.   Assessment and Plan:    Medication Adjustments/Labs and Tests Ordered: Current medicines are reviewed at length with the patient today.  Concerns regarding medicines are outlined above.   Tests Ordered: No orders of the defined types were placed in this encounter.   Medication Changes: No orders of the defined types were placed in this encounter.   Disposition:  Follow up {follow up:15908}  Signed, Satira Sark, MD, [facility] 10/03/2021 4:59 PM    [facility] [place], [place], [place] [address] Phone: [phone]; Fax: [phone]

## 2021-10-04 ENCOUNTER — Ambulatory Visit: Payer: Medicare Other | Admitting: Cardiology

## 2021-10-04 DIAGNOSIS — I25119 Atherosclerotic heart disease of native coronary artery with unspecified angina pectoris: Secondary | ICD-10-CM

## 2021-10-04 DIAGNOSIS — I4892 Unspecified atrial flutter: Secondary | ICD-10-CM

## 2021-11-15 NOTE — Progress Notes (Unsigned)
Cardiology Office Note  Date: 11/16/2021   ID: Theresa Norman, DOB 05/17/1940, MRN [id number]  PCP:  Benita Stabile, MD  Cardiologist:  Nona Dell, MD Electrophysiologist:  None   Chief Complaint  Patient presents with   Cardiac follow-up    History of Present Illness: Theresa Norman is an 81 y.o. female last seen in March by Ms. Strader PA-C, I reviewed note.  She is here for a routine visit.  States that she has been doing very well, no palpitations or chest pain, stable NYHA class II dyspnea.  She ran out of Eliquis recently and decided to start taking Plavix again which she still had an old bottle.  We discussed getting her a refill and provided samples today.  She otherwise remains on stable medical therapy as noted below.  I reviewed her echocardiogram and carotid Dopplers from earlier in the year.  I reviewed her interval lab work from June.  Past Medical History:  Diagnosis Date   Anxiety    Arthritis    CAD (coronary artery disease), native coronary artery    DES to proximal LAD January 2021   Essential hypertension    Immune deficiency disorder [facility])    Mixed hyperlipidemia    NSTEMI (non-ST elevated myocardial infarction) [facility])    January 2021   Paroxysmal atrial flutter [facility])     Past Surgical History:  Procedure Laterality Date   CATARACT EXTRACTION W/PHACO Right 11/07/2016   Procedure: CATARACT EXTRACTION PHACO AND INTRAOCULAR LENS PLACEMENT RIGHT EYE;  Surgeon: Gemma Payor, MD;  Location: [facility];  Service: Ophthalmology;  Laterality: Right;  CDE: 13.30   CATARACT EXTRACTION W/PHACO Left 11/20/2016   Procedure: CATARACT EXTRACTION PHACO AND INTRAOCULAR LENS PLACEMENT (IOC);  Surgeon: Gemma Payor, MD;  Location: [facility];  Service: Ophthalmology;  Laterality: Left;  CDE: 20.00   CORONARY STENT INTERVENTION N/A 01/29/2019   Procedure: CORONARY STENT INTERVENTION;  Surgeon: Iran Ouch, MD;  [facility];  Service: Cardiovascular;   Laterality: N/A;   INTRAVASCULAR PRESSURE WIRE/FFR STUDY N/A 01/29/2019   Procedure: INTRAVASCULAR PRESSURE WIRE/FFR STUDY;  Surgeon: Iran Ouch, MD;  [facility];  Service: Cardiovascular;  Laterality: N/A;   LEFT HEART CATH AND CORONARY ANGIOGRAPHY N/A 01/29/2019   Procedure: LEFT HEART CATH AND CORONARY ANGIOGRAPHY;  Surgeon: Iran Ouch, MD;  [facility];  Service: Cardiovascular;  Laterality: N/A;    Current Outpatient Medications  Medication Sig Dispense Refill   aspirin EC 81 MG tablet Take 1 tablet (81 mg total) by mouth daily. Swallow whole. 90 tablet 3   Carboxymethylcellulose Sodium (DRY EYE RELIEF OP) Apply 1 drop to eye as needed (dry eye).     Cholecalciferol (VITAMIN D3 PO) Take 1 tablet by mouth daily.     metoprolol succinate (TOPROL-XL) 25 MG 24 hr tablet Take 1 tablet (25 mg total) by mouth daily. 90 tablet 3   Multiple Vitamin (MULTIVITAMIN) tablet Take 1 tablet by mouth daily.     rosuvastatin (CRESTOR) 20 MG tablet Take 1 tablet (20 mg total) by mouth daily. 30 tablet 1   apixaban (ELIQUIS) 2.5 MG TABS tablet Take 1 tablet (2.5 mg total) by mouth 2 (two) times daily. 56 tablet 0   No current facility-administered medications for this visit.   Allergies:  Patient has no known allergies.   ROS:  No syncope.  Physical Exam: VS:  BP 130/80   Pulse 67   Ht 5'  1" (1.549 m)   Wt 134 lb 3.2 oz (60.9 kg)   SpO2 99%   BMI 25.36 kg/m , BMI Body mass index is 25.36 kg/m.  Wt Readings from Last 3 Encounters:  11/16/21 134 lb 3.2 oz (60.9 kg)  03/18/21 129 lb 9.6 oz (58.8 kg)  03/08/21 126 lb 8.7 oz (57.4 kg)    General: Patient appears comfortable at rest. HEENT: Conjunctiva and lids normal. Neck: Supple, no elevated JVP or carotid bruits. Lungs: Clear to auscultation, nonlabored breathing at rest. Cardiac: Irregularly irregular, no S3, 1/6 systolic murmur. Extremities: No pitting edema.  ECG:  An ECG dated 03/07/2021  was personally reviewed today and demonstrated:  Atrial flutter with variable block, LVH and left anterior fascicular block.  Recent Labwork: 03/07/2021: Hemoglobin 15.9; Platelets 223 03/08/2021: ALT 13; AST 20; BUN 20; Creatinine, Ser 0.81; Magnesium 2.3; Potassium 3.9; Sodium 138; TSH 3.854     Component Value Date/Time   CHOL 145 03/08/2021 0345   TRIG 70 03/08/2021 0345   HDL 60 03/08/2021 0345   CHOLHDL 2.4 03/08/2021 0345   VLDL 14 03/08/2021 0345   LDLCALC 71 03/08/2021 0345  June 2023: TSH 3.72, hemoglobin 15.5, platelets 214, BUN 14, creatinine 0.92, potassium 5.3, AST 20, ALT 15, cholesterol 149, triglycerides 97, HDL 70, LDL 61, hemoglobin A1c 5.7%  Other Studies Reviewed Today:  Cardiac catheterization 01/29/2019: The left ventricular systolic function is normal. LV end diastolic pressure is normal. The left ventricular ejection fraction is 55-65% by visual estimate. 3rd RPL lesion is 90% stenosed. Prox LAD lesion is 70% stenosed. Post intervention, there is a 0% residual stenosis. A drug-eluting stent was successfully placed using a STENT RESOLUTE ONYX 2.5X15.   1.  Significant one-vessel coronary artery disease.  There is 70% stenosis in the proximal LAD which was significant by DFR at 0.88.  There is also significant stenosis in a small RPL3 supplying a small territory and thus this is unlikely to be the culprit. 2.  Normal LV systolic function and left ventricular end-diastolic pressure. 3.  Successful angioplasty and drug-eluting stent placement to the proximal LAD.   Recommendations: Dual antiplatelet therapy for at least 12 months.  Aggressive treatment of risk factors.  Carotid Dopplers 03/08/2021: IMPRESSION: 1. Right carotid artery system: Less than 50% stenosis secondary to mild multifocal atherosclerotic plaque formation.   2. Left carotid artery system: 50-69% stenosis secondary to moderate multifocal atherosclerotic plaque formation, most prominent  about the carotid bulb and proximal ICA.   3.  Vertebral artery system: Patent with antegrade flow bilaterally.  Echocardiogram 03/08/2021:  1. Left ventricular ejection fraction, by estimation, is 60 to 65%. The  left ventricle has normal function. The left ventricle has no regional  wall motion abnormalities. There is mild left ventricular hypertrophy.  Left ventricular diastolic parameters  are indeterminate.   2. Right ventricular systolic function is normal. The right ventricular  size is normal. There is normal pulmonary artery systolic pressure.   3. The mitral valve is abnormal. Mild mitral valve regurgitation. No  evidence of mitral stenosis.   4. The aortic valve is tricuspid. There is mild calcification of the  aortic valve. There is mild thickening of the aortic valve. Aortic valve  regurgitation is not visualized. No aortic stenosis is present.   5. The inferior vena cava is normal in size with greater than 50%  respiratory variability, suggesting right atrial pressure of 3 mmHg.   Assessment and Plan:  1.  Paroxysmal atrial flutter with variable  conduction, asymptomatic in terms of palpitations and with CHA2DS2-VASc score of 7.  Samples provided for Eliquis with refill, would stop Plavix and go back to aspirin at low-dose as well.  LVEF 60 to 65% by echocardiogram earlier in the year.  Continue Toprol-XL.  2.  CAD status post DES to the LAD in 2021.  No active angina.  Continue Crestor.  3.  Carotid artery disease, mild to moderate by Dopplers in February.  Continue aspirin and Crestor.  Can reimage next year.  4.  Mixed hyperlipidemia, recent LDL 61.  Medication Adjustments/Labs and Tests Ordered: Current medicines are reviewed at length with the patient today.  Concerns regarding medicines are outlined above.   Tests Ordered: No orders of the defined types were placed in this encounter.   Medication Changes: Meds ordered this encounter  Medications   apixaban  (ELIQUIS) 2.5 MG TABS tablet    Sig: Take 1 tablet (2.5 mg total) by mouth 2 (two) times daily.    Dispense:  56 tablet    Refill:  0    Lot# [id number] Exp 04/2022    Disposition:  Follow up  6 months.  Signed, Satira Sark, MD, [facility] 11/16/2021 2:16 PM    [facility] at [facility] [address], [place], [place] [address] Phone: [phone]; Fax: [phone]

## 2021-11-16 ENCOUNTER — Ambulatory Visit: Payer: Medicare Other | Attending: Cardiology | Admitting: Cardiology

## 2021-11-16 ENCOUNTER — Encounter: Payer: Self-pay | Admitting: Cardiology

## 2021-11-16 VITALS — BP 130/80 | HR 67 | Ht 61.0 in | Wt 134.2 lb

## 2021-11-16 DIAGNOSIS — I25119 Atherosclerotic heart disease of native coronary artery with unspecified angina pectoris: Secondary | ICD-10-CM | POA: Diagnosis not present

## 2021-11-16 DIAGNOSIS — I4892 Unspecified atrial flutter: Secondary | ICD-10-CM

## 2021-11-16 DIAGNOSIS — E782 Mixed hyperlipidemia: Secondary | ICD-10-CM

## 2021-11-16 MED ORDER — APIXABAN 2.5 MG PO TABS: 2.5000 mg | ORAL_TABLET | Freq: Two times a day (BID) | ORAL | 0 refills | Status: DC

## 2021-11-16 NOTE — Patient Instructions (Addendum)
Medication Instructions:  Your physician has recommended you make the following change in your medication:  Stop plavix Restart eliquis 2.5 mg by mouth twice daily Continue other medications the same  Labwork: none  Testing/Procedures: none  Follow-Up: Your physician recommends that you schedule a follow-up appointment in: 6 months  Any Other Special Instructions Will Be Listed Below (If Applicable).  If you need a refill on your cardiac medications before your next appointment, please call your pharmacy.

## 2022-01-11 DIAGNOSIS — H04123 Dry eye syndrome of bilateral lacrimal glands: Secondary | ICD-10-CM | POA: Diagnosis not present

## 2022-01-26 DIAGNOSIS — R946 Abnormal results of thyroid function studies: Secondary | ICD-10-CM | POA: Diagnosis not present

## 2022-01-26 DIAGNOSIS — R7301 Impaired fasting glucose: Secondary | ICD-10-CM | POA: Diagnosis not present

## 2022-01-26 DIAGNOSIS — E782 Mixed hyperlipidemia: Secondary | ICD-10-CM | POA: Diagnosis not present

## 2022-01-30 ENCOUNTER — Encounter: Payer: Self-pay | Admitting: Internal Medicine

## 2022-01-30 DIAGNOSIS — I2510 Atherosclerotic heart disease of native coronary artery without angina pectoris: Secondary | ICD-10-CM | POA: Diagnosis not present

## 2022-01-30 DIAGNOSIS — K5909 Other constipation: Secondary | ICD-10-CM | POA: Diagnosis not present

## 2022-01-30 DIAGNOSIS — I10 Essential (primary) hypertension: Secondary | ICD-10-CM | POA: Diagnosis not present

## 2022-01-30 DIAGNOSIS — R7303 Prediabetes: Secondary | ICD-10-CM | POA: Diagnosis not present

## 2022-01-30 DIAGNOSIS — M25562 Pain in left knee: Secondary | ICD-10-CM | POA: Diagnosis not present

## 2022-01-30 DIAGNOSIS — Z0000 Encounter for general adult medical examination without abnormal findings: Secondary | ICD-10-CM | POA: Diagnosis not present

## 2022-01-30 DIAGNOSIS — R7401 Elevation of levels of liver transaminase levels: Secondary | ICD-10-CM | POA: Diagnosis not present

## 2022-01-30 DIAGNOSIS — R946 Abnormal results of thyroid function studies: Secondary | ICD-10-CM | POA: Diagnosis not present

## 2022-01-30 DIAGNOSIS — I129 Hypertensive chronic kidney disease with stage 1 through stage 4 chronic kidney disease, or unspecified chronic kidney disease: Secondary | ICD-10-CM | POA: Diagnosis not present

## 2022-01-30 DIAGNOSIS — E782 Mixed hyperlipidemia: Secondary | ICD-10-CM | POA: Diagnosis not present

## 2022-01-30 DIAGNOSIS — I700 Atherosclerosis of aorta: Secondary | ICD-10-CM | POA: Diagnosis not present

## 2022-01-30 DIAGNOSIS — N182 Chronic kidney disease, stage 2 (mild): Secondary | ICD-10-CM | POA: Diagnosis not present

## 2022-02-06 DIAGNOSIS — J069 Acute upper respiratory infection, unspecified: Secondary | ICD-10-CM | POA: Diagnosis not present

## 2022-02-06 DIAGNOSIS — J019 Acute sinusitis, unspecified: Secondary | ICD-10-CM | POA: Diagnosis not present

## 2022-02-06 DIAGNOSIS — R030 Elevated blood-pressure reading, without diagnosis of hypertension: Secondary | ICD-10-CM | POA: Diagnosis not present

## 2022-02-06 DIAGNOSIS — Z6824 Body mass index (BMI) 24.0-24.9, adult: Secondary | ICD-10-CM | POA: Diagnosis not present

## 2022-02-23 ENCOUNTER — Other Ambulatory Visit: Payer: Self-pay | Admitting: Student

## 2022-02-23 NOTE — Telephone Encounter (Signed)
Prescription refill request for Eliquis received. Indication:Atrial Flutter Last office visit: 11/16/21  Myles Gip MD Scr: 0.93 on 01/26/22 Age: 82 Weight: 60.9kg  Based on above findings Eliquis 2.27m twice daily is the appropriate dose.  Refill approved.

## 2022-04-10 DIAGNOSIS — M1712 Unilateral primary osteoarthritis, left knee: Secondary | ICD-10-CM | POA: Diagnosis not present

## 2022-04-19 DIAGNOSIS — Z79899 Other long term (current) drug therapy: Secondary | ICD-10-CM | POA: Diagnosis not present

## 2022-04-19 DIAGNOSIS — I10 Essential (primary) hypertension: Secondary | ICD-10-CM | POA: Diagnosis not present

## 2022-04-19 DIAGNOSIS — R600 Localized edema: Secondary | ICD-10-CM | POA: Diagnosis not present

## 2022-04-19 DIAGNOSIS — Z713 Dietary counseling and surveillance: Secondary | ICD-10-CM | POA: Diagnosis not present

## 2022-04-19 DIAGNOSIS — Z6823 Body mass index (BMI) 23.0-23.9, adult: Secondary | ICD-10-CM | POA: Diagnosis not present

## 2022-04-19 DIAGNOSIS — M25562 Pain in left knee: Secondary | ICD-10-CM | POA: Diagnosis not present

## 2022-06-13 NOTE — Progress Notes (Unsigned)
    Cardiology Office Note  Date: 06/14/2022   ID: Theresa Norman, DOB 11/17/1940, MRN [id number]  History of Present Illness: Theresa Norman is an 82 y.o. female last seen in November 2023.  She is here for a routine visit.  Reports no major change in status, specifically no exertional chest pain, no sense of palpitations, and stable NYHA class II dyspnea.  ECG shows that she is in rate controlled atrial fibrillation today, asymptomatic.  We went over her medications and she reports compliance with Eliquis for stroke prophylaxis, also on Toprol-XL.  She is due for follow-up carotid Dopplers which we discussed.  Lab work from January showed LDL 61 on Crestor.  Physical Exam: VS:  BP 130/82   Pulse 89   Ht 5' (1.524 m)   Wt 125 lb (56.7 kg)   SpO2 98%   BMI 24.41 kg/m , BMI Body mass index is 24.41 kg/m.  Wt Readings from Last 3 Encounters:  06/14/22 125 lb (56.7 kg)  11/16/21 134 lb 3.2 oz (60.9 kg)  03/18/21 129 lb 9.6 oz (58.8 kg)    General: Patient appears comfortable at rest. HEENT: Conjunctiva and lids normal. Neck: Supple, no elevated JVP or carotid bruits. Lungs: Clear to auscultation, nonlabored breathing at rest. Cardiac: Irregularly irregular without gallop, 1/6 systolic murmur. Extremities: No pitting edema.  ECG:  An ECG dated 03/07/2021 was personally reviewed today and demonstrated:  Atrial flutter with variable block, LVH and left anterior fascicular block.  Labwork:    Component Value Date/Time   CHOL 145 03/08/2021 0345   TRIG 70 03/08/2021 0345   HDL 60 03/08/2021 0345   CHOLHDL 2.4 03/08/2021 0345   VLDL 14 03/08/2021 0345   LDLCALC 71 03/08/2021 0345  January 2024: Cholesterol 139, triglycerides 92, HDL 61, LDL 61, BUN 10, creatinine 0.93, potassium 5.3, AST 23, ALT 15, TSH 1.18, hemoglobin A1c 5.8%  Other Studies Reviewed Today:  No interval cardiac testing for review today.  Assessment and Plan:  1.  CAD status post DES to the proximal LAD  in January 2021 in the setting of NSTEMI.  Echocardiogram in February 2023 revealed LVEF 60 to 65% without regional wall motion abnormalities.  Continue low-dose aspirin and Crestor.  She does not report any angina at this time.  2.  Persistent atrial fibrillation with history of paroxysmal atrial flutter with CHA2DS2-VASc score of 7.  Interval lab work reviewed, she reports no spontaneous bleeding problems on Eliquis 2.5 mg twice daily (dose based on age and weight).  Continue Toprol-XL.  She is asymptomatic.  3.  Asymptomatic carotid artery disease.  Carotid Dopplers in February 2023 revealed less than 50% RICA stenosis and 50 to 69% LICA stenosis.  She is due for follow-up imaging.  4.  Mixed hyperlipidemia.  LDL 61 in January.  She continues on Crestor.  Disposition:  Follow up  6 months.  Signed, Jonelle Sidle, M.D., F.A.C.C. [facility] at [facility]

## 2022-06-14 ENCOUNTER — Other Ambulatory Visit: Payer: Self-pay

## 2022-06-14 ENCOUNTER — Ambulatory Visit: Payer: Medicare Other | Attending: Cardiology | Admitting: Cardiology

## 2022-06-14 ENCOUNTER — Encounter: Payer: Self-pay | Admitting: Cardiology

## 2022-06-14 VITALS — BP 130/82 | HR 89 | Ht 60.0 in | Wt 125.0 lb

## 2022-06-14 DIAGNOSIS — I6523 Occlusion and stenosis of bilateral carotid arteries: Secondary | ICD-10-CM

## 2022-06-14 DIAGNOSIS — I25119 Atherosclerotic heart disease of native coronary artery with unspecified angina pectoris: Secondary | ICD-10-CM

## 2022-06-14 DIAGNOSIS — I4819 Other persistent atrial fibrillation: Secondary | ICD-10-CM

## 2022-06-14 DIAGNOSIS — E782 Mixed hyperlipidemia: Secondary | ICD-10-CM | POA: Diagnosis not present

## 2022-06-14 NOTE — Patient Instructions (Signed)
Medication Instructions:  Your physician recommends that you continue on your current medications as directed. Please refer to the Current Medication list given to you today.   Labwork: None today  Testing/Procedures: Your physician has requested that you have a carotid duplex. This test is an ultrasound of the carotid arteries in your neck. It looks at blood flow through these arteries that supply the brain with blood. Allow one hour for this exam. There are no restrictions or special instructions.   Follow-Up: 6 months  Any Other Special Instructions Will Be Listed Below (If Applicable).  If you need a refill on your cardiac medications before your next appointment, please call your pharmacy.  

## 2022-06-26 ENCOUNTER — Ambulatory Visit (HOSPITAL_COMMUNITY)
Admission: RE | Admit: 2022-06-26 | Discharge: 2022-06-26 | Disposition: A | Discharge status: Home / Self Care | Payer: Medicare Other | Source: Ambulatory Visit | Attending: Cardiology | Admitting: Cardiology

## 2022-06-26 DIAGNOSIS — I6523 Occlusion and stenosis of bilateral carotid arteries: Secondary | ICD-10-CM | POA: Insufficient documentation

## 2022-06-27 ENCOUNTER — Telehealth: Payer: Self-pay | Admitting: Cardiology

## 2022-06-27 NOTE — Telephone Encounter (Signed)
Patient returned call for her carotid duplex.

## 2022-06-28 NOTE — Telephone Encounter (Signed)
Pt notified of test results, copy to PCP

## 2022-07-25 DIAGNOSIS — E559 Vitamin D deficiency, unspecified: Secondary | ICD-10-CM | POA: Diagnosis not present

## 2022-07-25 DIAGNOSIS — R7303 Prediabetes: Secondary | ICD-10-CM | POA: Diagnosis not present

## 2022-07-25 DIAGNOSIS — R946 Abnormal results of thyroid function studies: Secondary | ICD-10-CM | POA: Diagnosis not present

## 2022-07-25 DIAGNOSIS — E782 Mixed hyperlipidemia: Secondary | ICD-10-CM | POA: Diagnosis not present

## 2022-07-31 ENCOUNTER — Encounter: Payer: Self-pay | Admitting: Internal Medicine

## 2022-07-31 DIAGNOSIS — I2510 Atherosclerotic heart disease of native coronary artery without angina pectoris: Secondary | ICD-10-CM | POA: Diagnosis not present

## 2022-07-31 DIAGNOSIS — R7401 Elevation of levels of liver transaminase levels: Secondary | ICD-10-CM | POA: Diagnosis not present

## 2022-07-31 DIAGNOSIS — K5909 Other constipation: Secondary | ICD-10-CM | POA: Diagnosis not present

## 2022-07-31 DIAGNOSIS — R946 Abnormal results of thyroid function studies: Secondary | ICD-10-CM | POA: Diagnosis not present

## 2022-07-31 DIAGNOSIS — E782 Mixed hyperlipidemia: Secondary | ICD-10-CM | POA: Diagnosis not present

## 2022-07-31 DIAGNOSIS — R7303 Prediabetes: Secondary | ICD-10-CM | POA: Diagnosis not present

## 2022-07-31 DIAGNOSIS — R600 Localized edema: Secondary | ICD-10-CM | POA: Diagnosis not present

## 2022-07-31 DIAGNOSIS — I129 Hypertensive chronic kidney disease with stage 1 through stage 4 chronic kidney disease, or unspecified chronic kidney disease: Secondary | ICD-10-CM | POA: Diagnosis not present

## 2022-07-31 DIAGNOSIS — E559 Vitamin D deficiency, unspecified: Secondary | ICD-10-CM | POA: Diagnosis not present

## 2022-07-31 DIAGNOSIS — M25562 Pain in left knee: Secondary | ICD-10-CM | POA: Diagnosis not present

## 2022-07-31 DIAGNOSIS — N182 Chronic kidney disease, stage 2 (mild): Secondary | ICD-10-CM | POA: Diagnosis not present

## 2022-07-31 DIAGNOSIS — I10 Essential (primary) hypertension: Secondary | ICD-10-CM | POA: Diagnosis not present

## 2022-08-17 ENCOUNTER — Other Ambulatory Visit: Payer: Self-pay | Admitting: Cardiology

## 2022-08-17 NOTE — Telephone Encounter (Signed)
Prescription refill request for Eliquis received. Indication: AF Last office visit: 06/14/22  Ival Bible MD Scr: 0.94 on 07/25/22 Age: 82 Weight: 56.7kg  Based on above findings Eliquis 2.5mg  twice daily is the appropriate dose.  Refill approved.

## 2022-10-03 DIAGNOSIS — M170 Bilateral primary osteoarthritis of knee: Secondary | ICD-10-CM | POA: Diagnosis not present

## 2022-10-03 DIAGNOSIS — G8929 Other chronic pain: Secondary | ICD-10-CM | POA: Diagnosis not present

## 2022-11-21 DIAGNOSIS — M1712 Unilateral primary osteoarthritis, left knee: Secondary | ICD-10-CM | POA: Diagnosis not present

## 2022-11-30 DIAGNOSIS — M1712 Unilateral primary osteoarthritis, left knee: Secondary | ICD-10-CM | POA: Diagnosis not present

## 2022-12-18 ENCOUNTER — Encounter: Payer: Self-pay | Admitting: Cardiology

## 2022-12-18 ENCOUNTER — Ambulatory Visit: Payer: Medicare Other | Attending: Cardiology | Admitting: Cardiology

## 2022-12-18 VITALS — BP 162/68 | HR 76 | Ht 61.0 in | Wt 131.2 lb

## 2022-12-18 DIAGNOSIS — I6523 Occlusion and stenosis of bilateral carotid arteries: Secondary | ICD-10-CM

## 2022-12-18 DIAGNOSIS — I4819 Other persistent atrial fibrillation: Secondary | ICD-10-CM

## 2022-12-18 DIAGNOSIS — I25119 Atherosclerotic heart disease of native coronary artery with unspecified angina pectoris: Secondary | ICD-10-CM | POA: Diagnosis not present

## 2022-12-18 DIAGNOSIS — I10 Essential (primary) hypertension: Secondary | ICD-10-CM

## 2022-12-18 DIAGNOSIS — E782 Mixed hyperlipidemia: Secondary | ICD-10-CM | POA: Diagnosis not present

## 2022-12-18 MED ORDER — LOSARTAN POTASSIUM 25 MG PO TABS: 12.5000 mg | ORAL_TABLET | Freq: Every day | ORAL | 3 refills | Status: AC

## 2022-12-18 NOTE — Progress Notes (Signed)
    Cardiology Office Note  Date: 12/18/2022   ID: Theresa Norman, DOB 11-27-1940, MRN [id number]  History of Present Illness: Theresa Norman is an 82 y.o. female last seen in June.  She is here for a routine visit.  She does not report any major change in stamina, has had some pain in her left knee and had a recent injection.  Reports NYHA class II dyspnea, no palpitations or exertional chest pain.  No dizziness or syncope.  I reviewed her medications today.  Current cardiovascular regimen includes aspirin, Eliquis, Toprol-XL, and Crestor.  She does not report any spontaneous bleeding problems.  We discussed the results of her most recent lab work and carotid Dopplers back in the summer.  I also rechecked her blood pressure which remains elevated.  She states that she does check blood pressure at home and it is also elevated.  Physical Exam: VS:  BP (!) 158/98   Pulse 76   Ht 5\' 1"  (1.549 m)   Wt 131 lb 3.2 oz (59.5 kg)   SpO2 99%   BMI 24.79 kg/m , BMI Body mass index is 24.79 kg/m.  Wt Readings from Last 3 Encounters:  12/18/22 131 lb 3.2 oz (59.5 kg)  06/14/22 125 lb (56.7 kg)  11/16/21 134 lb 3.2 oz (60.9 kg)    General: Patient appears comfortable at rest. HEENT: Conjunctiva and lids normal. Neck: Supple, no elevated JVP, soft left carotid bruit. Lungs: Clear to auscultation, nonlabored breathing at rest. Cardiac: Irregularly irregular, 1/6 systolic murmur, no gallop. Extremities: No pitting edema.  ECG:  An ECG dated 06/14/2022 was personally reviewed today and demonstrated:  Atrial fibrillation with left anterior fascicular block and increased voltage, nonspecific ST changes.  Labwork:  July 2024: TSH 3.38, hemoglobin 15.9, platelets 210, BUN 18, creatinine 0.94, potassium 5, AST 28, ALT 20, cholesterol 142, triglycerides 121, HDL 67, LDL 54, hemoglobin A1c 5.8%  Other Studies Reviewed Today:  No interval cardiac testing for review today.  Assessment and  Plan:  1.  CAD status post DES to the proximal LAD in January 2021 in the setting of NSTEMI.  Echocardiogram in February 2023 revealed LVEF 60 to 65% without regional wall motion abnormalities.  She does not report any angina, stable NYHA class II dyspnea.  Continue aspirin and Crestor.  2.  Primary hypertension.  Plan to start Cozaar 12.5 mg daily and uptitrate as necessary.  Continue to track blood pressure at home.  Follow-up lab work with Dr. Margo Aye this month as scheduled.   3.  Persistent atrial fibrillation with history of paroxysmal atrial flutter with CHA2DS2-VASc score of 7.  Asymptomatic in terms of palpitations.  Heart rate adequately controlled on Toprol-XL.  She continues on Eliquis for stroke prophylaxis, I reviewed her most recent lab work.  Denies any spontaneous bleeding problems.   4.  Asymptomatic carotid artery disease.  Follow-up carotid Dopplers in June of this year demonstrated a significant amount of atherosclerotic plaque, however not hemodynamically significant with 50 to 69% LICA stenosis and mild RICA stenosis.  Continue aspirin and Crestor.   5.  Mixed hyperlipidemia.  LDL 54 in July of this year.  She continues on Crestor.  Disposition:  Follow up  6 months.  Signed, Jonelle Sidle, M.D., F.A.C.C. [facility] at [facility]

## 2022-12-18 NOTE — Patient Instructions (Signed)
Medication Instructions:   START Losartan 12.5 mg daily for blood pressure   Labwork:  None today  Testing/Procedures: None today  Follow-Up: 6 months  Any Other Special Instructions Will Be Listed Below (If Applicable).  If you need a refill on your cardiac medications before your next appointment, please call your pharmacy.

## 2022-12-20 DIAGNOSIS — Z713 Dietary counseling and surveillance: Secondary | ICD-10-CM | POA: Diagnosis not present

## 2022-12-20 DIAGNOSIS — Z79899 Other long term (current) drug therapy: Secondary | ICD-10-CM | POA: Diagnosis not present

## 2022-12-20 DIAGNOSIS — Z6824 Body mass index (BMI) 24.0-24.9, adult: Secondary | ICD-10-CM | POA: Diagnosis not present

## 2022-12-20 DIAGNOSIS — R600 Localized edema: Secondary | ICD-10-CM | POA: Diagnosis not present

## 2022-12-20 DIAGNOSIS — I6529 Occlusion and stenosis of unspecified carotid artery: Secondary | ICD-10-CM | POA: Diagnosis not present

## 2022-12-20 DIAGNOSIS — I10 Essential (primary) hypertension: Secondary | ICD-10-CM | POA: Diagnosis not present

## 2022-12-20 DIAGNOSIS — Z7902 Long term (current) use of antithrombotics/antiplatelets: Secondary | ICD-10-CM | POA: Diagnosis not present

## 2022-12-20 DIAGNOSIS — M1712 Unilateral primary osteoarthritis, left knee: Secondary | ICD-10-CM | POA: Diagnosis not present

## 2022-12-28 DIAGNOSIS — M25561 Pain in right knee: Secondary | ICD-10-CM | POA: Diagnosis not present

## 2022-12-28 DIAGNOSIS — G8929 Other chronic pain: Secondary | ICD-10-CM | POA: Diagnosis not present

## 2022-12-28 DIAGNOSIS — M1712 Unilateral primary osteoarthritis, left knee: Secondary | ICD-10-CM | POA: Diagnosis not present

## 2022-12-28 DIAGNOSIS — M25562 Pain in left knee: Secondary | ICD-10-CM | POA: Diagnosis not present

## 2023-01-25 DIAGNOSIS — E559 Vitamin D deficiency, unspecified: Secondary | ICD-10-CM | POA: Diagnosis not present

## 2023-01-25 DIAGNOSIS — R946 Abnormal results of thyroid function studies: Secondary | ICD-10-CM | POA: Diagnosis not present

## 2023-01-25 DIAGNOSIS — R7303 Prediabetes: Secondary | ICD-10-CM | POA: Diagnosis not present

## 2023-01-25 DIAGNOSIS — E782 Mixed hyperlipidemia: Secondary | ICD-10-CM | POA: Diagnosis not present

## 2023-01-31 DIAGNOSIS — M1712 Unilateral primary osteoarthritis, left knee: Secondary | ICD-10-CM | POA: Diagnosis not present

## 2023-01-31 DIAGNOSIS — I2510 Atherosclerotic heart disease of native coronary artery without angina pectoris: Secondary | ICD-10-CM | POA: Diagnosis not present

## 2023-01-31 DIAGNOSIS — E782 Mixed hyperlipidemia: Secondary | ICD-10-CM | POA: Diagnosis not present

## 2023-01-31 DIAGNOSIS — N182 Chronic kidney disease, stage 2 (mild): Secondary | ICD-10-CM | POA: Diagnosis not present

## 2023-01-31 DIAGNOSIS — I10 Essential (primary) hypertension: Secondary | ICD-10-CM | POA: Diagnosis not present

## 2023-01-31 DIAGNOSIS — I129 Hypertensive chronic kidney disease with stage 1 through stage 4 chronic kidney disease, or unspecified chronic kidney disease: Secondary | ICD-10-CM | POA: Diagnosis not present

## 2023-01-31 DIAGNOSIS — E559 Vitamin D deficiency, unspecified: Secondary | ICD-10-CM | POA: Diagnosis not present

## 2023-01-31 DIAGNOSIS — M25562 Pain in left knee: Secondary | ICD-10-CM | POA: Diagnosis not present

## 2023-01-31 DIAGNOSIS — R7401 Elevation of levels of liver transaminase levels: Secondary | ICD-10-CM | POA: Diagnosis not present

## 2023-01-31 DIAGNOSIS — I6529 Occlusion and stenosis of unspecified carotid artery: Secondary | ICD-10-CM | POA: Diagnosis not present

## 2023-01-31 DIAGNOSIS — R600 Localized edema: Secondary | ICD-10-CM | POA: Diagnosis not present

## 2023-01-31 DIAGNOSIS — R7303 Prediabetes: Secondary | ICD-10-CM | POA: Diagnosis not present

## 2023-03-14 DIAGNOSIS — Z8673 Personal history of transient ischemic attack (TIA), and cerebral infarction without residual deficits: Secondary | ICD-10-CM | POA: Diagnosis not present

## 2023-03-14 DIAGNOSIS — I10 Essential (primary) hypertension: Secondary | ICD-10-CM | POA: Diagnosis not present

## 2023-03-14 DIAGNOSIS — I4892 Unspecified atrial flutter: Secondary | ICD-10-CM | POA: Diagnosis not present

## 2023-03-14 DIAGNOSIS — R600 Localized edema: Secondary | ICD-10-CM | POA: Diagnosis not present

## 2023-03-14 DIAGNOSIS — N182 Chronic kidney disease, stage 2 (mild): Secondary | ICD-10-CM | POA: Diagnosis not present

## 2023-03-14 DIAGNOSIS — I129 Hypertensive chronic kidney disease with stage 1 through stage 4 chronic kidney disease, or unspecified chronic kidney disease: Secondary | ICD-10-CM | POA: Diagnosis not present

## 2023-04-24 DIAGNOSIS — H43393 Other vitreous opacities, bilateral: Secondary | ICD-10-CM | POA: Diagnosis not present

## 2023-05-02 DIAGNOSIS — I10 Essential (primary) hypertension: Secondary | ICD-10-CM | POA: Diagnosis not present

## 2023-06-22 DIAGNOSIS — M25562 Pain in left knee: Secondary | ICD-10-CM | POA: Diagnosis not present

## 2023-06-22 DIAGNOSIS — G8929 Other chronic pain: Secondary | ICD-10-CM | POA: Diagnosis not present

## 2023-06-25 ENCOUNTER — Encounter: Payer: Self-pay | Admitting: Cardiology

## 2023-06-25 ENCOUNTER — Ambulatory Visit: Payer: Medicare Other | Attending: Cardiology | Admitting: Cardiology

## 2023-06-25 VITALS — BP 126/70 | HR 98 | Ht 60.0 in | Wt 126.2 lb

## 2023-06-25 DIAGNOSIS — E782 Mixed hyperlipidemia: Secondary | ICD-10-CM

## 2023-06-25 DIAGNOSIS — I4819 Other persistent atrial fibrillation: Secondary | ICD-10-CM | POA: Diagnosis not present

## 2023-06-25 DIAGNOSIS — I6523 Occlusion and stenosis of bilateral carotid arteries: Secondary | ICD-10-CM | POA: Diagnosis not present

## 2023-06-25 DIAGNOSIS — I4891 Unspecified atrial fibrillation: Secondary | ICD-10-CM

## 2023-06-25 DIAGNOSIS — I10 Essential (primary) hypertension: Secondary | ICD-10-CM | POA: Diagnosis not present

## 2023-06-25 DIAGNOSIS — I25119 Atherosclerotic heart disease of native coronary artery with unspecified angina pectoris: Secondary | ICD-10-CM | POA: Diagnosis not present

## 2023-06-25 DIAGNOSIS — I2510 Atherosclerotic heart disease of native coronary artery without angina pectoris: Secondary | ICD-10-CM

## 2023-06-25 NOTE — Patient Instructions (Signed)
Medication Instructions:  Your physician recommends that you continue on your current medications as directed. Please refer to the Current Medication list given to you today.   Labwork: None today  Testing/Procedures: Your physician has requested that you have a carotid duplex. This test is an ultrasound of the carotid arteries in your neck. It looks at blood flow through these arteries that supply the brain with blood. Allow one hour for this exam. There are no restrictions or special instructions.   Follow-Up: 6 months  Any Other Special Instructions Will Be Listed Below (If Applicable).  If you need a refill on your cardiac medications before your next appointment, please call your pharmacy.  

## 2023-06-25 NOTE — Progress Notes (Signed)
    Cardiology Office Note  Date: 06/25/2023   ID: ANGELES ZEHNER, DOB March 23, 1940, MRN [id number]  History of Present Illness: Theresa Norman is a 83 y.o. female last seen in December 2024.  She is here for a routine visit.  She reports no angina or sense of palpitations, stable NYHA class II dyspnea.  Still lives in her own home and functional with ADLs.  She does not report any falls.  We went over her medications.  No spontaneous bleeding problems or stool changes on Eliquis .  She reports compliance with her regimen.  I reviewed her interval lab work which is noted below.  I reviewed her ECG today which shows atrial fibrillation with nonspecific ST-T wave changes.  She is due for follow-up carotid Dopplers.  Physical Exam: VS:  BP 126/70   Pulse 98   Ht 5' (1.524 m)   Wt 126 lb 3.2 oz (57.2 kg)   SpO2 98%   BMI 24.65 kg/m , BMI Body mass index is 24.65 kg/m.  Wt Readings from Last 3 Encounters:  06/25/23 126 lb 3.2 oz (57.2 kg)  12/18/22 131 lb 3.2 oz (59.5 kg)  06/14/22 125 lb (56.7 kg)    General: Patient appears comfortable at rest. HEENT: Conjunctiva and lids normal. Neck: Supple, no elevated JVP, soft left carotid bruit. Lungs: Clear to auscultation, nonlabored breathing at rest. Cardiac: Irregular irregular, 1/6 systolic murmur without gallop. Extremities: No pitting edema.  ECG:  An ECG dated 06/14/2022 was personally reviewed today and demonstrated:  Atrial fibrillation with left anterior fascicular block, increased voltage, nonspecific ST changes.  Labwork:  January 2025: TSH 2.62, hemoglobin 16.3, platelets 254, BUN 12, creatinine 1.08, potassium 4.8, AST 26, ALT 16, cholesterol 142, triglycerides 116, HDL 63, LDL 58, hemoglobin A1c 5.8%  Other Studies Reviewed Today:  No interval cardiac testing for review today.  Assessment and Plan:  1.  CAD status post DES to the proximal LAD in January 2021 in the setting of NSTEMI.  Echocardiogram in February 2023  revealed LVEF 60 to 65% without regional wall motion abnormalities.  No angina with typical activity.  ECG reviewed.  Continue aspirin  81 mg daily and Crestor  20 mg daily.   2.  Primary hypertension.  Blood pressure adequately controlled today.  Continue losartan  12.5 mg daily.   3.  Persistent atrial fibrillation with history of paroxysmal atrial flutter with CHA2DS2-VASc score of 7.  Asymptomatic, ECG reviewed.  Continue Eliquis  2.5 mg twice daily and Toprol  XL 25 mg daily.   4.  Asymptomatic carotid artery disease.  Carotid Dopplers in June 2024 demonstrated significant amount of bilateral ICA atherosclerosis, however only 50 to 69% LICA stenosis and no significant stenosis of the RICA.  She is due for follow-up carotid Dopplers.  Continue aspirin  and statin.   5.  Mixed hyperlipidemia.  LDL 58 and HDL 63 in January.  Continue Crestor  20 mg daily.  Disposition:  Follow up 6 months.  Signed, Gerard Knight, M.D., F.A.C.C. [facility] at [facility]

## 2023-07-02 ENCOUNTER — Ambulatory Visit (HOSPITAL_COMMUNITY)
Admission: RE | Admit: 2023-07-02 | Discharge: 2023-07-02 | Disposition: A | Discharge status: Home / Self Care | Source: Ambulatory Visit | Attending: Cardiology | Admitting: Cardiology

## 2023-07-02 ENCOUNTER — Ambulatory Visit: Payer: Self-pay | Admitting: Cardiology

## 2023-07-02 DIAGNOSIS — I6523 Occlusion and stenosis of bilateral carotid arteries: Secondary | ICD-10-CM | POA: Diagnosis not present

## 2023-07-02 DIAGNOSIS — Z8679 Personal history of other diseases of the circulatory system: Secondary | ICD-10-CM | POA: Diagnosis not present

## 2023-07-12 DIAGNOSIS — R946 Abnormal results of thyroid function studies: Secondary | ICD-10-CM | POA: Diagnosis not present

## 2023-07-12 DIAGNOSIS — R7303 Prediabetes: Secondary | ICD-10-CM | POA: Diagnosis not present

## 2023-07-12 DIAGNOSIS — E782 Mixed hyperlipidemia: Secondary | ICD-10-CM | POA: Diagnosis not present

## 2023-07-12 DIAGNOSIS — E559 Vitamin D deficiency, unspecified: Secondary | ICD-10-CM | POA: Diagnosis not present

## 2023-07-19 DIAGNOSIS — N182 Chronic kidney disease, stage 2 (mild): Secondary | ICD-10-CM | POA: Diagnosis not present

## 2023-07-19 DIAGNOSIS — Z8673 Personal history of transient ischemic attack (TIA), and cerebral infarction without residual deficits: Secondary | ICD-10-CM | POA: Diagnosis not present

## 2023-07-19 DIAGNOSIS — I6529 Occlusion and stenosis of unspecified carotid artery: Secondary | ICD-10-CM | POA: Diagnosis not present

## 2023-07-19 DIAGNOSIS — I129 Hypertensive chronic kidney disease with stage 1 through stage 4 chronic kidney disease, or unspecified chronic kidney disease: Secondary | ICD-10-CM | POA: Diagnosis not present

## 2023-07-19 DIAGNOSIS — R809 Proteinuria, unspecified: Secondary | ICD-10-CM | POA: Diagnosis not present

## 2023-07-19 DIAGNOSIS — R600 Localized edema: Secondary | ICD-10-CM | POA: Diagnosis not present

## 2023-07-19 DIAGNOSIS — R7303 Prediabetes: Secondary | ICD-10-CM | POA: Diagnosis not present

## 2023-07-19 DIAGNOSIS — I10 Essential (primary) hypertension: Secondary | ICD-10-CM | POA: Diagnosis not present

## 2023-07-19 DIAGNOSIS — R7401 Elevation of levels of liver transaminase levels: Secondary | ICD-10-CM | POA: Diagnosis not present

## 2023-07-19 DIAGNOSIS — M1712 Unilateral primary osteoarthritis, left knee: Secondary | ICD-10-CM | POA: Diagnosis not present

## 2023-07-19 DIAGNOSIS — I480 Paroxysmal atrial fibrillation: Secondary | ICD-10-CM | POA: Diagnosis not present

## 2023-07-19 DIAGNOSIS — I2510 Atherosclerotic heart disease of native coronary artery without angina pectoris: Secondary | ICD-10-CM | POA: Diagnosis not present

## 2023-10-25 DIAGNOSIS — M1712 Unilateral primary osteoarthritis, left knee: Secondary | ICD-10-CM | POA: Diagnosis not present

## 2023-11-11 ENCOUNTER — Emergency Department (HOSPITAL_COMMUNITY)
Admission: EM | Admit: 2023-11-11 | Discharge: 2023-11-11 | Disposition: A | Discharge status: Home / Self Care | Attending: Emergency Medicine | Admitting: Emergency Medicine

## 2023-11-11 ENCOUNTER — Encounter (HOSPITAL_COMMUNITY): Payer: Self-pay | Admitting: Emergency Medicine

## 2023-11-11 DIAGNOSIS — Z7901 Long term (current) use of anticoagulants: Secondary | ICD-10-CM | POA: Insufficient documentation

## 2023-11-11 DIAGNOSIS — T7840XA Allergy, unspecified, initial encounter: Secondary | ICD-10-CM | POA: Diagnosis not present

## 2023-11-11 DIAGNOSIS — Z7982 Long term (current) use of aspirin: Secondary | ICD-10-CM | POA: Insufficient documentation

## 2023-11-11 DIAGNOSIS — R21 Rash and other nonspecific skin eruption: Secondary | ICD-10-CM | POA: Diagnosis present

## 2023-11-11 MED ORDER — FAMOTIDINE IN NACL 20-0.9 MG/50ML-% IV SOLN
20.0000 mg | Freq: Once | INTRAVENOUS | Status: AC
  Administered 2023-11-11: 20 mg via INTRAVENOUS
  Filled 2023-11-11: qty 50

## 2023-11-11 MED ORDER — EPINEPHRINE 0.3 MG/0.3ML IJ SOAJ
0.3000 mg | Freq: Once | INTRAMUSCULAR | Status: AC
  Administered 2023-11-11: 0.3 mg via INTRAMUSCULAR
  Filled 2023-11-11: qty 0.3

## 2023-11-11 MED ORDER — METHYLPREDNISOLONE SODIUM SUCC 125 MG IJ SOLR
125.0000 mg | Freq: Once | INTRAMUSCULAR | Status: AC
  Administered 2023-11-11: 125 mg via INTRAVENOUS
  Filled 2023-11-11: qty 2

## 2023-11-11 MED ORDER — PREDNISONE 20 MG PO TABS: ORAL_TABLET | ORAL | 0 refills | Status: AC

## 2023-11-11 MED ORDER — DIPHENHYDRAMINE HCL 50 MG/ML IJ SOLN
25.0000 mg | Freq: Once | INTRAMUSCULAR | Status: AC
  Administered 2023-11-11: 25 mg via INTRAVENOUS
  Filled 2023-11-11: qty 1

## 2023-11-11 MED ORDER — FAMOTIDINE 20 MG PO TABS: 20.0000 mg | ORAL_TABLET | Freq: Two times a day (BID) | ORAL | 0 refills | Status: AC

## 2023-11-11 NOTE — ED Triage Notes (Signed)
 Pt here from home with c/o allergic reaction  hives and redness to both arms and torso , airway intact, NKDA

## 2023-11-11 NOTE — ED Notes (Signed)
 Pt took 25mg  of Benadryl prior to arrival

## 2023-11-11 NOTE — ED Provider Notes (Signed)
 [place] EMERGENCY DEPARTMENT AT [facility] Provider Note   CSN: [id number] Arrival date & time: 11/11/23  1800     Patient presents with: Allergic Reaction   Theresa Norman is a 83 y.o. female.  {Add pertinent medical, surgical, social history, OB history to YEP:67052} Patient started with a rash on her belly and swelling to her lips.  She took some Benadryl but it did not help much   Allergic Reaction      Prior to Admission medications   Medication Sig Start Date End Date Taking? Authorizing Provider  famotidine (PEPCID) 20 MG tablet Take 1 tablet (20 mg total) by mouth 2 (two) times daily. 11/11/23  Yes Malavika Lira, MD  predniSONE (DELTASONE) 20 MG tablet 2 tabs po daily x 3 days 11/11/23  Yes Micaila Ziemba, MD  aspirin  EC 81 MG tablet Take 1 tablet (81 mg total) by mouth daily. Swallow whole. 03/18/21   Strader, Laymon HERO, PA-C  Carboxymethylcellulose Sodium (DRY EYE RELIEF OP) Apply 1 drop to eye as needed (dry eye).    [provider]  Cholecalciferol (VITAMIN D3 PO) Take 1 tablet by mouth daily.    [provider]  ELIQUIS  2.5 MG TABS tablet TAKE ONE TABLET BY MOUTH TWICE DAILY 08/17/22   Debera Jayson MATSU, MD  losartan  (COZAAR ) 25 MG tablet Take 0.5 tablets (12.5 mg total) by mouth daily. 12/18/22 06/25/23  Debera Jayson MATSU, MD  metoprolol  succinate (TOPROL -XL) 25 MG 24 hr tablet Take 1 tablet (25 mg total) by mouth daily. 02/06/19   Richarda Prentice LITTIE Mickey., NP  Multiple Vitamin (MULTIVITAMIN) tablet Take 1 tablet by mouth daily.    [provider]  rosuvastatin  (CRESTOR ) 20 MG tablet Take 1 tablet (20 mg total) by mouth daily. 03/09/21   Evonnie Lenis, MD    Allergies: Patient has no known allergies.    Review of Systems  Updated Vital Signs BP (!) 140/69   Pulse 86   Temp 98 F (36.7 C) (Oral)   Resp 17   SpO2 96%   Physical Exam  (all labs ordered are listed, but only abnormal results are displayed) Labs Reviewed - No data  to display  EKG: None  Radiology: No results found.  {Document cardiac monitor, telemetry assessment procedure when appropriate:32947} Procedures   Medications Ordered in the ED  diphenhydrAMINE (BENADRYL) injection 25 mg (25 mg Intravenous Given 11/11/23 1855)  methylPREDNISolone sodium succinate (SOLU-MEDROL) 125 mg/2 mL injection 125 mg (125 mg Intravenous Given 11/11/23 1858)  famotidine (PEPCID) IVPB 20 mg premix (0 mg Intravenous Stopped 11/11/23 2052)  EPINEPHrine  (EPI-PEN) injection 0.3 mg (0.3 mg Intramuscular Given 11/11/23 1857)      {Click here for ABCD2, HEART and other calculators REFRESH Note before signing:1}                              Medical Decision Making Risk Prescription drug management.   Patient with allergic reaction that responded to treatment in the emergency department.  She will hold off on taking her losartan  till she sees her family doctor this week's.  She is given prednisone and Pepcid prescriptions  {Document critical care time when appropriate  Document review of labs and clinical decision tools ie CHADS2VASC2, etc  Document your independent review of radiology images and any outside records  Document your discussion with family members, caretakers and with consultants  Document social determinants of health affecting pt's care  Document your decision making why or why not admission, treatments were needed:32947:::1}   Final diagnoses:  Allergic reaction, initial encounter    ED Discharge Orders          Ordered    predniSONE (DELTASONE) 20 MG tablet        11/11/23 2148    famotidine (PEPCID) 20 MG tablet  2 times daily        11/11/23 2148

## 2023-11-11 NOTE — Discharge Instructions (Signed)
 Take Benadryl for any itching or swelling.  Follow-up with your doctor this week.  Do not take your losartan  until you see your doctor

## 2024-01-04 ENCOUNTER — Telehealth: Payer: Self-pay | Admitting: Cardiology

## 2024-01-04 NOTE — Telephone Encounter (Signed)
 Forwarding to [employer].

## 2024-01-04 NOTE — Telephone Encounter (Signed)
 Spoke with pt regarding her swelling. The pt stated she has gained about 10 lbs in the last 3 weeks. Pt denied any shortness of breath or chest pain and stated she feels really good other than the swelling in her legs. Pt stated both of her legs are swollen and feel tight. Pt was asked about her diet and salt intake. Pt stated she does not eat much salt but then described the meals she has had this week including pork and collards which I explained to the pt has a lot of sodium. Pt stated she is taking 20 mg of Lasix daily since yesterday. Pt was originally prescribed to take 20 mg every other day. The pt stated this did not help with the swelling. Due to the amount of swelling and it being a Friday evening and the DOD has left, pt was advised to go to urgent care or the emergency room. Pt verbalized understanding. All questions if any were answered.

## 2024-01-04 NOTE — Telephone Encounter (Signed)
 Pt c/o swelling/edema: STAT if pt has developed SOB within 24 hours  If swelling, where is the swelling located?   Both legs, mostly in left leg  How much weight have you gained and in what time span?   Yes  Have you gained 2 pounds in a day or 5 pounds in a week?   Patient stated maybe  Do you have a log of your daily weights (if so, list)?   Are you currently taking a fluid pill?   Yes  Are you currently SOB?   No  Have you traveled recently in a car or plane for an extended period of time?   No  Patient stated the swelling in her legs has been going on for a while in her left leg and is now starting in her right foot.  Patient noted she recently had a medication change.

## 2024-01-22 ENCOUNTER — Ambulatory Visit: Attending: Nurse Practitioner | Admitting: Nurse Practitioner

## 2024-01-22 ENCOUNTER — Encounter: Payer: Self-pay | Admitting: Nurse Practitioner

## 2024-01-22 VITALS — BP 122/82 | HR 93 | Ht 61.0 in | Wt 136.6 lb

## 2024-01-22 DIAGNOSIS — R600 Localized edema: Secondary | ICD-10-CM

## 2024-01-22 DIAGNOSIS — I10 Essential (primary) hypertension: Secondary | ICD-10-CM

## 2024-01-22 DIAGNOSIS — I6523 Occlusion and stenosis of bilateral carotid arteries: Secondary | ICD-10-CM | POA: Diagnosis not present

## 2024-01-22 DIAGNOSIS — I2510 Atherosclerotic heart disease of native coronary artery without angina pectoris: Secondary | ICD-10-CM | POA: Diagnosis not present

## 2024-01-22 DIAGNOSIS — E785 Hyperlipidemia, unspecified: Secondary | ICD-10-CM

## 2024-01-22 DIAGNOSIS — I4819 Other persistent atrial fibrillation: Secondary | ICD-10-CM

## 2024-01-22 DIAGNOSIS — I4892 Unspecified atrial flutter: Secondary | ICD-10-CM | POA: Diagnosis not present

## 2024-01-22 NOTE — Patient Instructions (Signed)
 Medication Instructions:  Your physician has recommended you make the following change in your medication:  Stop taking Amlodipine Continue taking all other medications as prescribed  Labwork: None  Testing/Procedures: None  Follow-Up: Your physician recommends that you schedule a follow-up appointment in: 6 months  Any Other Special Instructions Will Be Listed Below (If Applicable). Update us  in 2-3 weeks with her leg swelling status and her Blood Pressure readings   Thank you for choosing [facility]!     If you need a refill on your cardiac medications before your next appointment, please call your pharmacy.

## 2024-01-22 NOTE — Progress Notes (Unsigned)
 " Cardiology Office Note   Date:  01/22/2024 ID:  Theresa Norman, DOB 12/18/1940, MRN [id number] PCP: Shona Norleen PEDLAR, MD  [facility] Providers Cardiologist:  Jayson Sierras, MD     History of Present Illness Theresa Norman is a very pleasant 84 y.o. female with a PMH of CAD, hypertension, persistent A-fib, paroxysmal A-flutter, carotid artery disease, mixed hyperlipidemia, who presents today for 70-month follow-up appointment.  Previous cardiovascular history of NSTEMI in 2021, received drug-eluting stent to proximal LAD.  Last seen by Dr. Sierras on June 25, 2023.  She was overall doing well at the time.  01/22/2024 - Here for follow-up. Doing overall fairly well, does admit to some leg edema, more noticeable (L>R), wants to know if she should stop Amlodipine. Denies any chest pain, shortness of breath, palpitations, syncope, presyncope, dizziness, orthopnea, PND, swelling or significant weight changes, acute bleeding, or claudication.  ROS: Negative. See HPI.   Studies Reviewed  EKG:  EKG Interpretation Date/Time:  Tuesday January 22 2024 10:22:42 EST Ventricular Rate:  93 PR Interval:    QRS Duration:  110 QT Interval:  368 QTC Calculation: 457 R Axis:   -46  Text Interpretation: Atrial fibrillation Left anterior fascicular block Left ventricular hypertrophy with repolarization abnormality ( R in aVL , Cornell product , Romhilt-Estes ) When compared with ECG of 25-Jun-2023 10:13, QRS duration has increased ST no longer depressed in Inferior leads T wave inversion no longer evident in Inferior leads Nonspecific T wave abnormality no longer evident in Anterior leads Confirmed by Miriam Norris [id number]) on 01/22/2024 10:43:38 AM   Carotid duplex 06/2023:  IMPRESSION: Similar moderate calcified plaque burden at the level of both carotid bulbs and proximal internal carotid arteries. Estimated bilateral ICA stenosis is less than 50%.  Echo 02/2021:  1. Left ventricular  ejection fraction, by estimation, is 60 to 65%. The  left ventricle has normal function. The left ventricle has no regional  wall motion abnormalities. There is mild left ventricular hypertrophy.  Left ventricular diastolic parameters  are indeterminate.   2. Right ventricular systolic function is normal. The right ventricular  size is normal. There is normal pulmonary artery systolic pressure.   3. The mitral valve is abnormal. Mild mitral valve regurgitation. No  evidence of mitral stenosis.   4. The aortic valve is tricuspid. There is mild calcification of the  aortic valve. There is mild thickening of the aortic valve. Aortic valve  regurgitation is not visualized. No aortic stenosis is present.   5. The inferior vena cava is normal in size with greater than 50%  respiratory variability, suggesting right atrial pressure of 3 mmHg.  LHC 01/2019:  The left ventricular systolic function is normal. LV end diastolic pressure is normal. The left ventricular ejection fraction is 55-65% by visual estimate. 3rd RPL lesion is 90% stenosed. Prox LAD lesion is 70% stenosed. Post intervention, there is a 0% residual stenosis. A drug-eluting stent was successfully placed using a STENT RESOLUTE ONYX 2.5X15.   1.  Significant one-vessel coronary artery disease.  There is 70% stenosis in the proximal LAD which was significant by DFR at 0.88.  There is also significant stenosis in a small RPL3 supplying a small territory and thus this is unlikely to be the culprit. 2.  Normal LV systolic function and left ventricular end-diastolic pressure. 3.  Successful angioplasty and drug-eluting stent placement to the proximal LAD.   Recommendations: Dual antiplatelet therapy for at least 12 months.  Aggressive treatment of  risk factors. Likely discharge home tomorrow.  Physical Exam VS:  BP 122/82 (BP Location: Left Arm)   Pulse 93   Ht 5' 1 (1.549 m)   Wt 136 lb 9.6 oz (62 kg)   SpO2 98%   BMI 25.81  kg/m        Wt Readings from Last 3 Encounters:  01/22/24 136 lb 9.6 oz (62 kg)  06/25/23 126 lb 3.2 oz (57.2 kg)  12/18/22 131 lb 3.2 oz (59.5 kg)    GEN: Well nourished, well developed in no acute distress NECK: No JVD; No carotid bruits CARDIAC: S1/S2, RRR, no murmurs, rubs, gallops RESPIRATORY:  Clear to auscultation without rales, wheezing or rhonchi  ABDOMEN: Soft, non-tender, non-distended EXTREMITIES:  No edema; No deformity   ASSESSMENT AND PLAN  Leg edema Etiology multifactorial in possibly due to amlodipine.  Will stop this medication and see if this improves her symptoms.  Continue Lasix.  Recommend low-salt, heart healthy diet.  Leg elevation and compression stockings as needed. Heart healthy diet and regular cardiovascular exercise encouraged.   CAD Stable with no anginal symptoms. No indication for ischemic evaluation.  Continue aspirin , losartan , Toprol -XL, rosuvastatin , and nitroglycerin  as needed. Heart healthy diet and regular cardiovascular exercise encouraged.   HTN Blood pressure stable not at goal. Discussed to monitor BP at home at least 2 hours after medications and sitting for 5-10 minutes.  Stopping amlodipine as noted above.  No other medication changes at this time. Heart healthy diet and regular cardiovascular exercise encouraged.   Persistent A-fib/A-flutter Today EKG shows rate controlled A-fib.  She denies any recent tachycardia or palpitations.  Continue Toprol -XL.  Continue Eliquis  for stroke prevention. Heart healthy diet and regular cardiovascular exercise encouraged.   Carotid artery stenosis, HLD  Carotid duplex from June 2025 showed moderate bilateral ICA atherosclerosis, less than 50%.  She is asymptomatic.  Continue current medication regimen. Heart healthy diet and regular cardiovascular exercise encouraged.   She will update us  in 2 to 3 weeks regarding her leg swelling status and her blood pressure readings.   Dispo: Will request most  recent labs from her PCP.  Follow-up with MD/APP in 6 months or sooner if any changes.  Signed, Almarie Crate, NP   "
